# Patient Record
Sex: Female | Born: 1955 | Race: White | Hispanic: No | Marital: Married | Smoking: Never smoker
Health system: Southern US, Community
[De-identification: ages and names within clinical notes are randomized; demographics above are authoritative.]

## PROBLEM LIST (undated history)

## (undated) DIAGNOSIS — I10 Essential (primary) hypertension: Secondary | ICD-10-CM

## (undated) DIAGNOSIS — E079 Disorder of thyroid, unspecified: Secondary | ICD-10-CM

## (undated) DIAGNOSIS — R7303 Prediabetes: Secondary | ICD-10-CM

## (undated) DIAGNOSIS — N840 Polyp of corpus uteri: Secondary | ICD-10-CM

## (undated) DIAGNOSIS — M545 Low back pain, unspecified: Secondary | ICD-10-CM

## (undated) DIAGNOSIS — E559 Vitamin D deficiency, unspecified: Secondary | ICD-10-CM

## (undated) DIAGNOSIS — D259 Leiomyoma of uterus, unspecified: Secondary | ICD-10-CM

## (undated) DIAGNOSIS — Z973 Presence of spectacles and contact lenses: Secondary | ICD-10-CM

## (undated) DIAGNOSIS — D219 Benign neoplasm of connective and other soft tissue, unspecified: Secondary | ICD-10-CM

## (undated) DIAGNOSIS — R935 Abnormal findings on diagnostic imaging of other abdominal regions, including retroperitoneum: Secondary | ICD-10-CM

## (undated) DIAGNOSIS — T7840XA Allergy, unspecified, initial encounter: Secondary | ICD-10-CM

## (undated) DIAGNOSIS — M1990 Unspecified osteoarthritis, unspecified site: Secondary | ICD-10-CM

## (undated) DIAGNOSIS — N904 Leukoplakia of vulva: Secondary | ICD-10-CM

## (undated) DIAGNOSIS — G2581 Restless legs syndrome: Secondary | ICD-10-CM

## (undated) DIAGNOSIS — E039 Hypothyroidism, unspecified: Secondary | ICD-10-CM

## (undated) DIAGNOSIS — G4700 Insomnia, unspecified: Secondary | ICD-10-CM

## (undated) DIAGNOSIS — M5136 Other intervertebral disc degeneration, lumbar region: Secondary | ICD-10-CM

## (undated) DIAGNOSIS — G8929 Other chronic pain: Secondary | ICD-10-CM

## (undated) DIAGNOSIS — E785 Hyperlipidemia, unspecified: Secondary | ICD-10-CM

## (undated) DIAGNOSIS — M51369 Other intervertebral disc degeneration, lumbar region without mention of lumbar back pain or lower extremity pain: Secondary | ICD-10-CM

## (undated) DIAGNOSIS — Z972 Presence of dental prosthetic device (complete) (partial): Secondary | ICD-10-CM

## (undated) HISTORY — DX: Low back pain: M54.5

## (undated) HISTORY — DX: Other chronic pain: G89.29

## (undated) HISTORY — DX: Restless legs syndrome: G25.81

## (undated) HISTORY — DX: Benign neoplasm of connective and other soft tissue, unspecified: D21.9

## (undated) HISTORY — DX: Disorder of thyroid, unspecified: E07.9

## (undated) HISTORY — DX: Vitamin D deficiency, unspecified: E55.9

## (undated) HISTORY — DX: Allergy, unspecified, initial encounter: T78.40XA

## (undated) HISTORY — DX: Insomnia, unspecified: G47.00

## (undated) HISTORY — DX: Low back pain, unspecified: M54.50

## (undated) HISTORY — DX: Essential (primary) hypertension: I10

---

## 1986-11-07 HISTORY — PX: TENNIS ELBOW RELEASE/NIRSCHEL PROCEDURE: SHX6651

## 1997-11-07 HISTORY — PX: ELBOW SURGERY: SHX618

## 2011-11-30 DIAGNOSIS — G8929 Other chronic pain: Secondary | ICD-10-CM | POA: Insufficient documentation

## 2013-08-21 ENCOUNTER — Ambulatory Visit: Payer: Self-pay

## 2013-12-31 ENCOUNTER — Telehealth: Payer: Self-pay | Admitting: *Deleted

## 2013-12-31 NOTE — Telephone Encounter (Signed)
Refill request Mobic 15mg  denied, initially prescribed by Dr Janus Molder.  Denied, pt needs an appt.

## 2016-01-08 LAB — HM MAMMOGRAPHY

## 2016-02-05 LAB — HM PAP SMEAR: HM PAP: NEGATIVE

## 2016-04-15 ENCOUNTER — Ambulatory Visit (INDEPENDENT_AMBULATORY_CARE_PROVIDER_SITE_OTHER): Payer: BLUE CROSS/BLUE SHIELD | Admitting: Family Medicine

## 2016-04-15 ENCOUNTER — Encounter: Payer: Self-pay | Admitting: Family Medicine

## 2016-04-15 VITALS — BP 104/68 | HR 81 | Ht 63.75 in | Wt 227.1 lb

## 2016-04-15 DIAGNOSIS — E039 Hypothyroidism, unspecified: Secondary | ICD-10-CM

## 2016-04-15 DIAGNOSIS — K219 Gastro-esophageal reflux disease without esophagitis: Secondary | ICD-10-CM

## 2016-04-15 DIAGNOSIS — Z113 Encounter for screening for infections with a predominantly sexual mode of transmission: Secondary | ICD-10-CM

## 2016-04-15 DIAGNOSIS — E079 Disorder of thyroid, unspecified: Secondary | ICD-10-CM

## 2016-04-15 DIAGNOSIS — G4700 Insomnia, unspecified: Secondary | ICD-10-CM

## 2016-04-15 DIAGNOSIS — I10 Essential (primary) hypertension: Secondary | ICD-10-CM

## 2016-04-15 DIAGNOSIS — Z8639 Personal history of other endocrine, nutritional and metabolic disease: Secondary | ICD-10-CM

## 2016-04-15 DIAGNOSIS — Z1321 Encounter for screening for nutritional disorder: Secondary | ICD-10-CM | POA: Diagnosis not present

## 2016-04-15 DIAGNOSIS — E559 Vitamin D deficiency, unspecified: Secondary | ICD-10-CM

## 2016-04-15 DIAGNOSIS — E669 Obesity, unspecified: Secondary | ICD-10-CM

## 2016-04-15 DIAGNOSIS — G8929 Other chronic pain: Secondary | ICD-10-CM

## 2016-04-15 DIAGNOSIS — E785 Hyperlipidemia, unspecified: Secondary | ICD-10-CM

## 2016-04-15 NOTE — Patient Instructions (Addendum)
- Get fasting labs in near future- pt to make appt b-4 leaving today. - cut BP med in half--> check at home and keep a BP log - address wt loss and phenteramine at next visit if BP remains well controlled and wil discuss labs obtain     DASH Eating Plan  DASH stands for "Dietary Approaches to Stop Hypertension." The DASH eating plan is a healthy eating plan that has been shown to reduce high blood pressure (hypertension). Additional health benefits may include reducing the risk of type 2 diabetes mellitus, heart disease, and stroke. The DASH eating plan may also help with weight loss. WHAT DO I NEED TO KNOW ABOUT THE DASH EATING PLAN? For the DASH eating plan, you will follow these general guidelines:  Choose foods with a percent daily value for sodium of less than 5% (as listed on the food label).  Use salt-free seasonings or herbs instead of table salt or sea salt.  Check with your health care provider or pharmacist before using salt substitutes.  Eat lower-sodium products, often labeled as "lower sodium" or "no salt added."  Eat fresh foods.  Eat more vegetables, fruits, and low-fat dairy products.  Choose whole grains. Look for the word "whole" as the first word in the ingredient list.  Choose fish and skinless chicken or [place] more often than red meat. Limit fish, poultry, and meat to 6 oz (170 g) each day.  Limit sweets, desserts, sugars, and sugary drinks.  Choose heart-healthy fats.  Limit cheese to 1 oz (28 g) per day.  Eat more home-cooked food and less restaurant, buffet, and fast food.  Limit fried foods.  Cook foods using methods other than frying.  Limit canned vegetables. If you do use them, rinse them well to decrease the sodium.  When eating at a restaurant, ask that your food be prepared with less salt, or no salt if possible. WHAT FOODS CAN I EAT? Seek help from a dietitian for individual calorie needs. Grains Whole grain or whole wheat bread. Brown  rice. Whole grain or whole wheat pasta. Quinoa, bulgur, and whole grain cereals. Low-sodium cereals. Corn or whole wheat flour tortillas. Whole grain cornbread. Whole grain crackers. Low-sodium crackers. Vegetables Fresh or frozen vegetables (raw, steamed, roasted, or grilled). Low-sodium or reduced-sodium tomato and vegetable juices. Low-sodium or reduced-sodium tomato sauce and paste. Low-sodium or reduced-sodium canned vegetables.  Fruits All fresh, canned (in natural juice), or frozen fruits. Meat and Other Protein Products Ground beef (85% or leaner), grass-fed beef, or beef trimmed of fat. Skinless chicken or [place]. Ground chicken or [place]. Pork trimmed of fat. All fish and seafood. Eggs. Dried beans, peas, or lentils. Unsalted nuts and seeds. Unsalted canned beans. Dairy Low-fat dairy products, such as skim or 1% milk, 2% or reduced-fat cheeses, low-fat ricotta or cottage cheese, or plain low-fat yogurt. Low-sodium or reduced-sodium cheeses. Fats and Oils Tub margarines without trans fats. Light or reduced-fat mayonnaise and salad dressings (reduced sodium). Avocado. Safflower, olive, or canola oils. Natural peanut or almond butter. Other Unsalted popcorn and pretzels. The items listed above may not be a complete list of recommended foods or beverages. Contact your dietitian for more options. WHAT FOODS ARE NOT RECOMMENDED? Grains White bread. White pasta. White rice. Refined cornbread. Bagels and croissants. Crackers that contain trans fat. Vegetables Creamed or fried vegetables. Vegetables in a cheese sauce. Regular canned vegetables. Regular canned tomato sauce and paste. Regular tomato and vegetable juices. Fruits Dried fruits. Canned fruit in light or  heavy syrup. Fruit juice. Meat and Other Protein Products Fatty cuts of meat. Ribs, chicken wings, bacon, sausage, bologna, salami, chitterlings, fatback, hot dogs, bratwurst, and packaged luncheon meats. Salted nuts and seeds.  Canned beans with salt. Dairy Whole or 2% milk, cream, half-and-half, and cream cheese. Whole-fat or sweetened yogurt. Full-fat cheeses or blue cheese. Nondairy creamers and whipped toppings. Processed cheese, cheese spreads, or cheese curds. Condiments Onion and garlic salt, seasoned salt, table salt, and sea salt. Canned and packaged gravies. Worcestershire sauce. Tartar sauce. Barbecue sauce. Teriyaki sauce. Soy sauce, including reduced sodium. Steak sauce. Fish sauce. Oyster sauce. Cocktail sauce. Horseradish. Ketchup and mustard. Meat flavorings and tenderizers. Bouillon cubes. Hot sauce. Tabasco sauce. Marinades. Taco seasonings. Relishes. Fats and Oils Butter, stick margarine, lard, shortening, ghee, and bacon fat. Coconut, palm kernel, or palm oils. Regular salad dressings. Other Pickles and olives. Salted popcorn and pretzels. The items listed above may not be a complete list of foods and beverages to avoid. Contact your dietitian for more information. WHERE CAN I FIND MORE INFORMATION? National Heart, Lung, and Blood Institute: travelstabloid.com   This information is not intended to replace advice given to you by your health care provider. Make sure you discuss any questions you have with your health care provider.   Document Released: 10/13/2011 Document Revised: 11/14/2014 Document Reviewed: 08/28/2013 Elsevier Interactive Patient Education 2016 [employer]  1. Water Drink at least 8 to 12 cups of water daily. Consume half of your body weight in pounds, is the amount of water in ounces to drink daily.  Ie: a 200lb person = 100 oz water daily  2. Dark Green Vegetables Eat dark green vegetables at least three to four times a week. Good options include broccoli, peppers, brussel sprouts and leafy greens like kale and spinach.  3. Whole Grains Whole grains should be included in your diet at least two to three times  daily. Look for whole wheat flour, rye, oatmeal, barley, amaranth, quinoa or a multigrain. A good source of fiber includes 3 to 4 grams of fiber per serving. A great source has 5 or more grams of fiber per serving.  4. Beans and Lentils Try to eat a bean-based meal at least once a week. Try to add legumes, including beans and lentils, to soups, stews, casseroles, salads and dips or eat them plain.  5. Fish Try to eat two to three serving of fish a week. A serving consists of 3 to 4 ounces of cooked fish. Good choices are salmon, trout, herring, bluefish, sardines and tuna.  6. Berries Include two to four servings of fruit in your diet each day. Try to eat berries such as raspberries, blueberries, blackberries and strawberries.  7. Winter Squash Eat butternut and acorn squash as well as other richly pigmented dark orange and green colored vegetables like sweet potato, cantaloupe and mango.  8. Soy 25 grams of soy protein a day is recommended as part of a low-fat diet to help lower cholesterol levels. Try tofu, soymilk, edamame soybeans, tempeh and texturized vegetable protein (TVP).  9. Flaxseed, Nuts and Seeds Add 1 to 2 tablespoons of ground flaxseed or other seeds to food each day or include a moderate amount of nuts - 1/4 cup - in your daily diet.  10. Organic Yogurt Men and women between 40 and 39 years of age need 1000 milligrams of calcium a day and 1200 milligrams if 61 or older. Eat calcium-rich foods  such as nonfat or low-fat dairy products three to four times a day. Include organic choices.  Phentermine tablets or capsules What is this medicine? PHENTERMINE (FEN ter meen) decreases your appetite. It is used with a reduced calorie diet and exercise to help you lose weight. This medicine may be used for other purposes; ask your health care provider or pharmacist if you have questions. What should I tell my health care provider before I take this medicine? They need to know if you  have any of these conditions: -agitation -glaucoma -heart disease -high blood pressure -history of substance abuse -lung disease called Primary Pulmonary Hypertension (PPH) -taken an MAOI like Carbex, Eldepryl, Marplan, Nardil, or Parnate in last 14 days -thyroid disease -an unusual or allergic reaction to phentermine, other medicines, foods, dyes, or preservatives -pregnant or trying to get pregnant -breast-feeding How should I use this medicine? Take this medicine by mouth with a glass of water. Follow the directions on the prescription label. This medicine is usually taken 30 minutes before or 1 to 2 hours after breakfast. Avoid taking this medicine in the evening. It may interfere with sleep. Take your doses at regular intervals. Do not take your medicine more often than directed. Talk to your pediatrician regarding the use of this medicine in children. Special care may be needed. Overdosage: If you think you have taken too much of this medicine contact a poison control center or emergency room at once. NOTE: This medicine is only for you. Do not share this medicine with others. What if I miss a dose? If you miss a dose, take it as soon as you can. If it is almost time for your next dose, take only that dose. Do not take double or extra doses. What may interact with this medicine? Do not take this medicine with any of the following medications: -duloxetine -MAOIs like Carbex, Eldepryl, Marplan, Nardil, and Parnate -medicines for colds or breathing difficulties like pseudoephedrine or phenylephrine -procarbazine -sibutramine -SSRIs like citalopram, escitalopram, fluoxetine, fluvoxamine, paroxetine, and sertraline -stimulants like dexmethylphenidate, methylphenidate or modafinil -venlafaxine This medicine may also interact with the following medications: -medicines for diabetes This list may not describe all possible interactions. Give your health care provider a list of all the  medicines, herbs, non-prescription drugs, or dietary supplements you use. Also tell them if you smoke, drink alcohol, or use illegal drugs. Some items may interact with your medicine. What should I watch for while using this medicine? Notify your physician immediately if you become short of breath while doing your normal activities. Do not take this medicine within 6 hours of bedtime. It can keep you from getting to sleep. Avoid drinks that contain caffeine and try to stick to a regular bedtime every night. This medicine was intended to be used in addition to a healthy diet and exercise. The best results are achieved this way. This medicine is only indicated for short-term use. Eventually your weight loss may level out. At that point, the drug will only help you maintain your new weight. Do not increase or in any way change your dose without consulting your doctor. You may get drowsy or dizzy. Do not drive, use machinery, or do anything that needs mental alertness until you know how this medicine affects you. Do not stand or sit up quickly, especially if you are an older patient. This reduces the risk of dizzy or fainting spells. Alcohol may increase dizziness and drowsiness. Avoid alcoholic drinks. What side effects may I notice from  receiving this medicine? Side effects that you should report to your doctor or health care professional as soon as possible: -chest pain, palpitations -depression or severe changes in mood -increased blood pressure -irritability -nervousness or restlessness -severe dizziness -shortness of breath -problems urinating -unusual swelling of the legs -vomiting Side effects that usually do not require medical attention (report to your doctor or health care professional if they continue or are bothersome): -blurred vision or other eye problems -changes in sexual ability or desire -constipation or diarrhea -difficulty sleeping -dry mouth or unpleasant  taste -headache -nausea This list may not describe all possible side effects. Call your doctor for medical advice about side effects. You may report side effects to FDA at 1-800-FDA-1088. Where should I keep my medicine? Keep out of the reach of children. This medicine can be abused. Keep your medicine in a safe place to protect it from theft. Do not share this medicine with anyone. Selling or giving away this medicine is dangerous and against the law. This medicine may cause accidental overdose and death if taken by other adults, children, or pets. Mix any unused medicine with a substance like cat litter or coffee grounds. Then throw the medicine away in a sealed container like a sealed bag or a coffee can with a lid. Do not use the medicine after the expiration date. Store at room temperature between 20 and 25 degrees C (68 and 77 degrees F). Keep container tightly closed. NOTE: This sheet is a summary. It may not cover all possible information. If you have questions about this medicine, talk to your doctor, pharmacist, or health care provider.    2016, Elsevier/Gold Standard. (2014-07-15 16:19:53)

## 2016-04-15 NOTE — Progress Notes (Signed)
Dawn Alexander, D.O. Primary care at [facility]  Subjective:    CC: New pt, here to establish care.   HPI: Dawn Alexander is a pleasant 60 y.o. female who presents to [facility] today To become established.     In 2012 she moved here from [place] for her husband's job and has not been able to find a PCP that she likes. She has been going to [facility] with her last visit in January.  Hypertension: As had for several years now. She monitors it closely at home and keeps a log. It is been very well controlled for at least 3-4 months now. Patient wishes to decrease and/or come off the medication.  Morbid obesity: Patient was on Fen/Phen back in 1997 for about one year when she lost 45 pounds. She has struggled with her weight all her life. She wishes to go back on a weight loss medication in the near future area next  Sleep disorder: Patient takes over-the-counter medications. Symptoms stable. No side effects of medications.  Menses related migraines:  Stable  Hypothyroidism: 12-13 years now. Patient has been on the same dose for at least 5-7 years. Stable. Asymptomatic.  Generalized osteoarthritis: Patient takes meloxicam. Symptoms stable.  GERD: Zantac when necessary. Works well. Stable. No complaints.   Past Medical History  Diagnosis Date  . Vitamin D deficiency   . Allergy   . Insomnia     Past Surgical History  Procedure Laterality Date  . Elbow surgery Right 1999    Family History  Problem Relation Age of Onset  . Diabetes Mother   . COPD Mother   . Cancer Mother     lung  . Multiple sclerosis Father   . Heart disease Father   . Cancer Sister     lung  . Stroke Sister   . Cancer Brother     bladder  . Diabetes Daughter   . Stroke Sister   . Leukemia Sister   . Alcohol abuse Brother   . Healthy Brother   . Healthy Daughter     History  Drug Use No  ,  History  Alcohol  Use  . 3.0 oz/week  . 5 Shots of liquor per week  ,  History  Smoking status  . Never Smoker   Smokeless tobacco  . Never Used  ,  History  Sexual Activity  . Sexual Activity: Yes  . Birth Control/ Protection: Post-menopausal    Patient's Medications  New Prescriptions   No medications on file  Previous Medications   CETIRIZINE (ZYRTEC) 10 MG TABLET    Take 1 tablet by mouth daily.   CHOLECALCIFEROL (VITAMIN D) 1000 UNITS TABLET    Take 5,000 Units by mouth daily.   DOXYLAMINE, SLEEP, (SLEEP AID) 25 MG TABLET    Take 1 tablet by mouth at bedtime.   LEVOTHYROXINE (SYNTHROID, LEVOTHROID) 100 MCG TABLET    Take 1 tablet by mouth daily.   LISINOPRIL-HYDROCHLOROTHIAZIDE (PRINZIDE,ZESTORETIC) 10-12.5 MG TABLET    Take 1 tablet by mouth daily.   MELOXICAM (MOBIC) 15 MG TABLET    Take 1 tablet by mouth daily.   MULTIPLE VITAMINS-MINERALS (B COMPLEX PLUS VITAMIN C PO)    Take 1 tablet by mouth daily.   RANITIDINE (ZANTAC) 150 MG CAPSULE    Take 1 capsule by mouth daily.  Modified Medications   No medications on file  Discontinued  Medications   No medications on file    ALLERGIES: Review of patient's allergies indicates no known allergies.  Review of Systems: General:   Denies fever, chills, unexplained weight loss.  Optho/Auditory:   Denies visual changes, blurred vision/LOV Respiratory:   Denies SOB, DOE.  Cardiovascular:   Denies chest pain, palpitations, new onset peripheral edema  Gastrointestinal:   Denies nausea, vomiting, diarrhea.  Genitourinary:    Denies dysuria, increased frequency, flank pain.  Endocrine:     Denies hot or cold intolerance, polyuria, polydipsia. Musculoskeletal:   Denies unexplained myalgias, joint swelling, unexplained arthralgias, gait problems.  Skin:  Denies rash, suspicious lesions Neurological:     Denies dizziness, unexplained weakness, lightheadedness, numbness  Psychiatric/Behavioral:   Denies mood changes, suicidal or homicidal ideations,  hallucinations   Objective:   Blood pressure 104/68, pulse 81, height 5' 3.75" (1.619 m), weight 227 lb 1.6 oz (103.012 kg). Body mass index is 39.3 kg/(m^2). General: Well Developed, well nourished, and in no acute distress.  Neuro: Alert and oriented x3, extra-ocular muscles intact, sensation grossly intact.  HEENT: Normocephalic, atraumatic, pupils equal round reactive to light, neck supple, no gross masses, no carotid bruits, no JVD apprec Skin: no gross suspicious lesions or rashes  Cardiac: Regular rate and rhythm, no murmurs rubs or gallops.  Respiratory: Essentially clear to auscultation bilaterally. Not using accessory muscles, speaking in full sentences.  Abdominal: Soft, not grossly distended Musculoskeletal: Ambulates w/o diff, FROM * 4 ext.  Vasc: less 2 sec cap RF, warm and pink  Psych:  No HI/SI, judgement and insight good.    Impression and Recommendations:    The patient was counselled, risk factors were discussed, anticipatory guidance given.  Obesity Once we obtain lab work in the future, we will have a follow-up office visit to then discuss the possibilities of going on phentermine;  Vitamin D deficiency Continue vitamin D supplementation; will check levels.  Insomnia Discussed with patient to use YouTube sleep meditation; sleep hygiene discussed with patient  HLD (hyperlipidemia) Will need recheck in the near future. Patient has not been on cholesterol meds for some time now.  Chronic pain Patient will follow-up with her chronic pain physicians for this condition. Told her I'm fine refilling meloxicam in the future if needed.  hypothyroidism We'll check levels in near future. Continue supplementation for now  GERD (gastroesophageal reflux disease) Continue Zantac.  Hypertension Total patient to cut dose of blood pressure med in half.    Keep a blood pressure log and bring in next office visit.    Lifestyle modifications discussed with patient's in  order to aid in bringing down blood pressure  H/O type II diabetes mellitus Patient will need lab screening to rule out current status of diabetes.   Gross side effects, risk and benefits, and alternatives of medications discussed with patient.  Patient is aware that all medications have potential side effects and we are unable to predict every sideeffect or drug-drug interaction that may occur.  Expresses verbal understanding and consents to current therapy plan and treatment regiment.  Note: This document was prepared using Dragon voice recognition software and may include unintentional dictation errors.

## 2016-04-18 ENCOUNTER — Encounter: Payer: Self-pay | Admitting: Family Medicine

## 2016-04-18 DIAGNOSIS — E559 Vitamin D deficiency, unspecified: Secondary | ICD-10-CM | POA: Insufficient documentation

## 2016-04-18 DIAGNOSIS — G4700 Insomnia, unspecified: Secondary | ICD-10-CM | POA: Insufficient documentation

## 2016-04-18 DIAGNOSIS — K219 Gastro-esophageal reflux disease without esophagitis: Secondary | ICD-10-CM | POA: Insufficient documentation

## 2016-04-18 DIAGNOSIS — Z113 Encounter for screening for infections with a predominantly sexual mode of transmission: Secondary | ICD-10-CM | POA: Insufficient documentation

## 2016-04-18 NOTE — Assessment & Plan Note (Signed)
Continue Zantac 

## 2016-04-18 NOTE — Assessment & Plan Note (Signed)
We'll check levels in near future. Continue supplementation for now

## 2016-04-18 NOTE — Assessment & Plan Note (Signed)
Patient will follow-up with her chronic pain physicians for this condition. Told her I'm fine refilling meloxicam in the future if needed.

## 2016-04-18 NOTE — Assessment & Plan Note (Signed)
Patient will need lab screening to rule out current status of diabetes.

## 2016-04-18 NOTE — Assessment & Plan Note (Signed)
Discussed with patient to use YouTube sleep meditation; sleep hygiene discussed with patient

## 2016-04-18 NOTE — Assessment & Plan Note (Signed)
Will need recheck in the near future. Patient has not been on cholesterol meds for some time now.

## 2016-04-18 NOTE — Assessment & Plan Note (Addendum)
Once we obtain lab work in the future, we will have a follow-up office visit to then discuss the possibilities of going on phentermine;

## 2016-04-18 NOTE — Assessment & Plan Note (Addendum)
Total patient to cut dose of blood pressure med in half.    Keep a blood pressure log and bring in next office visit.    Lifestyle modifications discussed with patient's in order to aid in bringing down blood pressure

## 2016-04-18 NOTE — Assessment & Plan Note (Signed)
Continue vitamin D supplementation; will check levels.

## 2016-04-21 ENCOUNTER — Other Ambulatory Visit: Payer: Self-pay

## 2016-04-21 DIAGNOSIS — I10 Essential (primary) hypertension: Secondary | ICD-10-CM

## 2016-04-21 DIAGNOSIS — G4700 Insomnia, unspecified: Secondary | ICD-10-CM

## 2016-04-21 DIAGNOSIS — E785 Hyperlipidemia, unspecified: Secondary | ICD-10-CM

## 2016-04-21 DIAGNOSIS — Z8639 Personal history of other endocrine, nutritional and metabolic disease: Secondary | ICD-10-CM

## 2016-04-21 DIAGNOSIS — E079 Disorder of thyroid, unspecified: Secondary | ICD-10-CM

## 2016-04-21 DIAGNOSIS — E559 Vitamin D deficiency, unspecified: Secondary | ICD-10-CM

## 2016-04-21 DIAGNOSIS — Z1321 Encounter for screening for nutritional disorder: Secondary | ICD-10-CM

## 2016-04-22 ENCOUNTER — Other Ambulatory Visit (INDEPENDENT_AMBULATORY_CARE_PROVIDER_SITE_OTHER): Payer: BLUE CROSS/BLUE SHIELD

## 2016-04-22 DIAGNOSIS — E559 Vitamin D deficiency, unspecified: Secondary | ICD-10-CM

## 2016-04-22 DIAGNOSIS — E785 Hyperlipidemia, unspecified: Secondary | ICD-10-CM

## 2016-04-22 DIAGNOSIS — Z1321 Encounter for screening for nutritional disorder: Secondary | ICD-10-CM

## 2016-04-22 DIAGNOSIS — I10 Essential (primary) hypertension: Secondary | ICD-10-CM

## 2016-04-22 DIAGNOSIS — G4700 Insomnia, unspecified: Secondary | ICD-10-CM

## 2016-04-22 DIAGNOSIS — E079 Disorder of thyroid, unspecified: Secondary | ICD-10-CM

## 2016-04-22 DIAGNOSIS — Z8639 Personal history of other endocrine, nutritional and metabolic disease: Secondary | ICD-10-CM

## 2016-04-23 LAB — COMPREHENSIVE METABOLIC PANEL
ALT: 13 U/L (ref 6–29)
AST: 14 U/L (ref 10–35)
Albumin: 3.8 g/dL (ref 3.6–5.1)
Alkaline Phosphatase: 72 U/L (ref 33–130)
BUN: 24 mg/dL (ref 7–25)
CHLORIDE: 105 mmol/L (ref 98–110)
CO2: 26 mmol/L (ref 20–31)
CREATININE: 0.89 mg/dL (ref 0.50–0.99)
Calcium: 9.2 mg/dL (ref 8.6–10.4)
GLUCOSE: 104 mg/dL — AB (ref 65–99)
Potassium: 4.1 mmol/L (ref 3.5–5.3)
SODIUM: 140 mmol/L (ref 135–146)
Total Bilirubin: 0.6 mg/dL (ref 0.2–1.2)
Total Protein: 6.3 g/dL (ref 6.1–8.1)

## 2016-04-23 LAB — CBC WITH DIFFERENTIAL/PLATELET
BASOS ABS: 53 {cells}/uL (ref 0–200)
Basophils Relative: 1 %
EOS PCT: 7 %
Eosinophils Absolute: 371 cells/uL (ref 15–500)
HCT: 39.2 % (ref 35.0–45.0)
Hemoglobin: 12.7 g/dL (ref 11.7–15.5)
LYMPHS PCT: 28 %
Lymphs Abs: 1484 cells/uL (ref 850–3900)
MCH: 27 pg (ref 27.0–33.0)
MCHC: 32.4 g/dL (ref 32.0–36.0)
MCV: 83.4 fL (ref 80.0–100.0)
MONOS PCT: 8 %
MPV: 11.2 fL (ref 7.5–12.5)
Monocytes Absolute: 424 cells/uL (ref 200–950)
NEUTROS ABS: 2968 {cells}/uL (ref 1500–7800)
Neutrophils Relative %: 56 %
PLATELETS: 258 10*3/uL (ref 140–400)
RBC: 4.7 MIL/uL (ref 3.80–5.10)
RDW: 13.8 % (ref 11.0–15.0)
WBC: 5.3 10*3/uL (ref 3.8–10.8)

## 2016-04-23 LAB — LIPID PANEL
CHOL/HDL RATIO: 2.9 ratio (ref <=5.0)
Cholesterol: 168 mg/dL (ref 125–200)
HDL: 57 mg/dL (ref >=46)
LDL CALC: 94 mg/dL (ref <130)
Triglycerides: 87 mg/dL (ref <150)
VLDL: 17 mg/dL (ref <30)

## 2016-04-23 LAB — VITAMIN D 25 HYDROXY (VIT D DEFICIENCY, FRACTURES): VIT D 25 HYDROXY: 47 ng/mL (ref 30–100)

## 2016-04-23 LAB — HEMOGLOBIN A1C
HEMOGLOBIN A1C: 6.2 % — AB (ref <5.7)
Mean Plasma Glucose: 131 mg/dL

## 2016-04-23 LAB — VITAMIN B12: VITAMIN B 12: 1331 pg/mL — AB (ref 200–1100)

## 2016-04-23 LAB — TSH: TSH: 1.67 m[IU]/L

## 2016-04-26 ENCOUNTER — Telehealth: Payer: Self-pay

## 2016-04-26 ENCOUNTER — Other Ambulatory Visit: Payer: BLUE CROSS/BLUE SHIELD

## 2016-04-26 NOTE — Telephone Encounter (Signed)
Pt states that her systolic blood pressure readings have been running between [id number] and dyastolically [id number].  She is concerned that these are reading is too low.  She would like to know if she can just stop her blood pressure medication altogether.  Please advise.  Charyl Bigger, CMA

## 2016-04-28 NOTE — Telephone Encounter (Signed)
Pt states that she is already taking 1/2 tablet daily.  Per Dr. Raliegh Scarlet, pt is to stop medication all together, continue checking blood pressure readings daily and RTC in 2 weeks for re-evaluation.  Pt expressed understanding and is agreeable.  Charyl Bigger, CMA

## 2016-05-03 ENCOUNTER — Ambulatory Visit: Payer: BLUE CROSS/BLUE SHIELD | Admitting: Family Medicine

## 2016-05-05 ENCOUNTER — Ambulatory Visit: Payer: BLUE CROSS/BLUE SHIELD | Admitting: Family Medicine

## 2016-05-12 ENCOUNTER — Ambulatory Visit (INDEPENDENT_AMBULATORY_CARE_PROVIDER_SITE_OTHER): Payer: BLUE CROSS/BLUE SHIELD | Admitting: Family Medicine

## 2016-05-12 ENCOUNTER — Encounter: Payer: Self-pay | Admitting: Family Medicine

## 2016-05-12 VITALS — BP 138/85 | HR 76 | Ht 63.75 in | Wt 229.9 lb

## 2016-05-12 DIAGNOSIS — Z8639 Personal history of other endocrine, nutritional and metabolic disease: Secondary | ICD-10-CM

## 2016-05-12 DIAGNOSIS — E669 Obesity, unspecified: Secondary | ICD-10-CM

## 2016-05-12 DIAGNOSIS — E079 Disorder of thyroid, unspecified: Secondary | ICD-10-CM

## 2016-05-12 DIAGNOSIS — R748 Abnormal levels of other serum enzymes: Secondary | ICD-10-CM

## 2016-05-12 DIAGNOSIS — I10 Essential (primary) hypertension: Secondary | ICD-10-CM | POA: Diagnosis not present

## 2016-05-12 DIAGNOSIS — E785 Hyperlipidemia, unspecified: Secondary | ICD-10-CM | POA: Diagnosis not present

## 2016-05-12 DIAGNOSIS — E559 Vitamin D deficiency, unspecified: Secondary | ICD-10-CM

## 2016-05-12 DIAGNOSIS — G2581 Restless legs syndrome: Secondary | ICD-10-CM

## 2016-05-12 NOTE — Patient Instructions (Addendum)
B12 levels are high at 1331 advised patient to stop her B12 supplement and only take the B complex supplement. We will recheck this in 6-12 months.    Goal: exercise / walk 10 min per day         Mediterranean Diet  Why follow it? Research shows. . Those who follow the Mediterranean diet have a reduced risk of heart disease  . The diet is associated with a reduced incidence of Parkinson's and Alzheimer's diseases . People following the diet may have longer life expectancies and lower rates of chronic diseases  . The Dietary Guidelines for Americans recommends the Mediterranean diet as an eating plan to promote health and prevent disease  What Is the Mediterranean Diet?  . Healthy eating plan based on typical foods and recipes of Mediterranean-style cooking . The diet is primarily a plant based diet; these foods should make up a majority of meals   Starches - Plant based foods should make up a majority of meals - They are an important sources of vitamins, minerals, energy, antioxidants, and fiber - Choose whole grains, foods high in fiber and minimally processed items  - Typical grain sources include wheat, oats, barley, corn, brown rice, bulgar, farro, millet, polenta, couscous  - Various types of beans include chickpeas, lentils, fava beans, black beans, white beans   Fruits  Veggies - Large quantities of antioxidant rich fruits & veggies; 6 or more servings  - Vegetables can be eaten raw or lightly drizzled with oil and cooked  - Vegetables common to the traditional Mediterranean Diet include: artichokes, arugula, beets, broccoli, brussel sprouts, cabbage, carrots, celery, collard greens, cucumbers, eggplant, kale, leeks, lemons, lettuce, mushrooms, okra, onions, peas, peppers, potatoes, pumpkin, radishes, rutabaga, shallots, spinach, sweet potatoes, turnips, zucchini - Fruits common to the Mediterranean Diet include: apples, apricots, avocados, cherries, clementines, dates, figs,  grapefruits, grapes, melons, nectarines, oranges, peaches, pears, pomegranates, strawberries, tangerines  Fats - Replace butter and margarine with healthy oils, such as olive oil, canola oil, and tahini  - Limit nuts to no more than a handful a day  - Nuts include walnuts, almonds, pecans, pistachios, pine nuts  - Limit or avoid candied, honey roasted or heavily salted nuts - Olives are central to the [employer] - can be eaten whole or used in a variety of dishes   Meats Protein - Limiting red meat: no more than a few times a month - When eating red meat: choose lean cuts and keep the portion to the size of deck of cards - Eggs: approx. 0 to 4 times a week  - Fish and lean poultry: at least 2 a week  - Healthy protein sources include, chicken, [place], lean beef, lamb - Increase intake of seafood such as tuna, salmon, trout, mackerel, shrimp, scallops - Avoid or limit high fat processed meats such as sausage and bacon  Dairy - Include moderate amounts of low fat dairy products  - Focus on healthy dairy such as fat free yogurt, skim milk, low or reduced fat cheese - Limit dairy products higher in fat such as whole or 2% milk, cheese, ice cream  Alcohol - Moderate amounts of red wine is ok  - No more than 5 oz daily for women (all ages) and men older than age 64  - No more than 10 oz of wine daily for men younger than 63  Other - Limit sweets and other desserts  - Use herbs and spices instead of salt to flavor  foods  - Herbs and spices common to the traditional Mediterranean Diet include: basil, bay leaves, chives, cloves, cumin, fennel, garlic, lavender, marjoram, mint, oregano, parsley, pepper, rosemary, sage, savory, sumac, tarragon, thyme   It's not just a diet, it's a lifestyle:  . The Mediterranean diet includes lifestyle factors typical of those in the region  . Foods, drinks and meals are best eaten with others and savored . Daily physical activity is important for overall good  health . This could be strenuous exercise like running and aerobics . This could also be more leisurely activities such as walking, housework, yard-work, or taking the stairs . Moderation is the key; a balanced and healthy diet accommodates most foods and drinks . Consider portion sizes and frequency of consumption of certain foods   Meal Ideas & Options:  . Breakfast:  o Whole wheat toast or whole wheat English muffins with peanut butter & hard boiled egg o Steel cut oats topped with apples & cinnamon and skim milk  o Fresh fruit: banana, strawberries, melon, berries, peaches  o Smoothies: strawberries, bananas, greek yogurt, peanut butter o Low fat greek yogurt with blueberries and granola  o Egg white omelet with spinach and mushrooms o Breakfast couscous: whole wheat couscous, apricots, skim milk, cranberries  . Sandwiches:  o Hummus and grilled vegetables (peppers, zucchini, squash) on whole wheat bread   o Grilled chicken on whole wheat pita with lettuce, tomatoes, cucumbers or tzatziki  o Tuna salad on whole wheat bread: tuna salad made with greek yogurt, olives, red peppers, capers, green onions o Garlic rosemary lamb pita: lamb sauted with garlic, rosemary, salt & pepper; add lettuce, cucumber, greek yogurt to pita - flavor with lemon juice and black pepper  . Seafood:  o Mediterranean grilled salmon, seasoned with garlic, basil, parsley, lemon juice and black pepper o Shrimp, lemon, and spinach whole-grain pasta salad made with low fat greek yogurt  o Seared scallops with lemon orzo  o Seared tuna steaks seasoned salt, pepper, coriander topped with tomato mixture of olives, tomatoes, olive oil, minced garlic, parsley, green onions and cappers  . Meats:  o Herbed greek chicken salad with kalamata olives, cucumber, feta  o Red bell peppers stuffed with spinach, bulgur, lean ground beef (or lentils) & topped with feta   o Kebabs: skewers of chicken, tomatoes, onions, zucchini,  squash  o [place] burgers: made with red onions, mint, dill, lemon juice, feta cheese topped with roasted red peppers . Vegetarian o Cucumber salad: cucumbers, artichoke hearts, celery, red onion, feta cheese, tossed in olive oil & lemon juice  o Hummus and whole grain pita points with a greek salad (lettuce, tomato, feta, olives, cucumbers, red onion) o Lentil soup with celery, carrots made with vegetable broth, garlic, salt and pepper  o Tabouli salad: parsley, bulgur, mint, scallions, cucumbers, tomato, radishes, lemon juice, olive oil, salt and pepper.     Risk factors for prediabetes and type 2 diabetes Researchers don't fully understand why some people develop prediabetes and type 2 diabetes and others don't.  It's clear that certain factors increase the risk, however, including:  Weight. The more fatty tissue you have, the more resistant your cells become to insulin.  Inactivity. The less active you are, the greater your risk. Physical activity helps you control your weight, uses up glucose as energy and makes your cells more sensitive to insulin.  Family history. Your risk increases if a parent or sibling has type 2 diabetes.  Race. Although it's  unclear why, people of certain races - including blacks, Hispanics, American Indians and Asian-Americans - are at higher risk.  Age. Your risk increases as you get older. This may be because you tend to exercise less, lose muscle mass and gain weight as you age. But type 2 diabetes is also increasing dramatically among children, adolescents and younger adults.  Gestational diabetes. If you developed gestational diabetes when you were pregnant, your risk of developing prediabetes and type 2 diabetes later increases. If you gave birth to a baby weighing more than 9 pounds (4 kilograms), you're also at risk of type 2 diabetes.  Polycystic ovary syndrome. For women, having polycystic ovary syndrome - a common condition characterized by irregular  menstrual periods, excess hair growth and obesity - increases the risk of diabetes.  High blood pressure. Having blood pressure over 140/90 millimeters of mercury (mm Hg) is linked to an increased risk of type 2 diabetes.  Abnormal cholesterol and triglyceride levels. If you have low levels of high-density lipoprotein (HDL), or "good," cholesterol, your risk of type 2 diabetes is higher. Triglycerides are another type of fat carried in the blood. People with high levels of triglycerides have an increased risk of type 2 diabetes. Your doctor can let you know what your cholesterol and triglyceride levels are.    A good guide to good carbs: The glycemic index ---If you have diabetes, or at risk for diabetes, you know all too well that when you eat carbohydrates, your blood sugar goes up. The total amount of carbs you consume at a meal or in a snack mostly determines what your blood sugar will do. But the food itself also plays a role. A serving of white rice has almost the same effect as eating pure table sugar - a quick, high spike in blood sugar. A serving of lentils has a slower, smaller effect.  ---Picking good sources of carbs can help you control your blood sugar and your weight. Even if you don't have diabetes, eating healthier carbohydrate-rich foods can help ward off a host of chronic conditions, from heart disease to various cancers to, well, diabetes.  ---One way to choose foods is with the glycemic index (GI). This tool measures how much a food boosts blood sugar.  The glycemic index rates the effect of a specific amount of a food on blood sugar compared with the same amount of pure glucose. A food with a glycemic index of 28 boosts blood sugar only 28% as much as pure glucose. One with a GI of 95 acts like pure glucose.  High glycemic foods result in a quick spike in insulin and blood sugar (also known as blood glucose). Low glycemic foods have a slower, smaller effect.   Using the glycemic  index Using the glycemic index is easy: choose foods in the low GI category instead of those in the high GI category (see below), and go easy on those in between. Low glycemic index (GI of 55 or less): Most fruits and vegetables, beans, minimally processed grains, pasta, low-fat dairy foods, and nuts.  Moderate glycemic index (GI 56 to 69): White and sweet potatoes, corn, white rice, couscous, breakfast cereals such as Cream of Wheat and Mini Wheats.  High glycemic index (GI of 70 or higher): White bread, rice cakes, most crackers, bagels, cakes, doughnuts, croissants, most packaged breakfast cereals. You can see the values for 100 commons foods and get links to more at www.[email or web address].si.  Swaps for lowering glycemic index  Instead of  this high-glycemic index food Eat this lower-glycemic index food  White rice Brown rice or converted rice  Instant oatmeal Steel-cut oats  Cornflakes Bran flakes  Baked potato Pasta, bulgur  White bread Whole-grain bread  Corn Peas or leafy greens

## 2016-05-16 DIAGNOSIS — T7840XA Allergy, unspecified, initial encounter: Secondary | ICD-10-CM | POA: Insufficient documentation

## 2016-05-16 NOTE — Progress Notes (Signed)
Subjective:    Chief Complaint  Patient presents with  . Discuss lab test results  . Ankle Pain    right    HPI: Dawn Alexander is a 60 y.o. female who presents to [place] at [facility] today for f/up to discuss labs and reck her BP- bring log of home BP's in with her.  This is pt's second time here at the clinic with [place].    In 2012 she moved here from [place] for her husband's job   Hypertension: As had for several years. She monitors it closely at home and keeps a log. It is been very well controlled for at least 3-4 months now.  Last OV was up, so asked her to f/up for it today.,   Patient wished to decrease and/or come off her BP medication, so she has kept close eye on her Bp and weened off her bp meds. . Home blood pressures been running on average 123-144 / 65-85 since she went off meds on 6/23.  Asx  Morbid obesity: Patient was on Fen/Phen back in 1997 for about one year when she lost 45 pounds. She has struggled with her weight all her life. She wishes to go back on a weight loss medication in the near future but we had discussion about it today and pt is not emotionally ready to eat healthy and start wlaking  Sleep disorder: Patient takes over-the-counter medications. Symptoms stable. No side effects of medications.  I advised melatonin OTC  Menses related migraines: Stable  Hypothyroidism: 12-13 years now. Patient has been on the same dose for at least 5-7 years. Stable. Asymptomatic.  Generalized osteoarthritis: Patient takes meloxicam. Symptoms stable but today she c/o mild R ankle pain and b/l ankle swelling.  More so in lateral aspect in ATFL region. Worse at end of night/day and goes down every am when she awakens.  Rarely ever has pain in ankle in the AM's.  Pt has really dec her activity levels more recently and states she can't start a walking program.         Past Medical History  Diagnosis Date  . Vitamin D deficiency   . Allergy     . Insomnia      Past Surgical History  Procedure Laterality Date  . Elbow surgery Right 1999     Family History  Problem Relation Age of Onset  . Diabetes Mother   . COPD Mother   . Cancer Mother     lung  . Multiple sclerosis Father   . Heart disease Father   . Cancer Sister     lung  . Stroke Sister   . Cancer Brother     bladder  . Diabetes Daughter   . Stroke Sister   . Leukemia Sister   . Alcohol abuse Brother   . Healthy Brother   . Healthy Daughter      History  Drug Use No  ,  History  Alcohol Use  . 3.0 oz/week  . 5 Shots of liquor per week  ,  History  Smoking status  . Never Smoker   Smokeless tobacco  . Never Used  ,  History  Sexual Activity  . Sexual Activity: Yes  . Birth Control/ Protection: Post-menopausal      Current Outpatient Prescriptions on File Prior to Visit  Medication Sig Dispense Refill  . cetirizine (ZYRTEC) 10 MG tablet Take 1 tablet by mouth daily.    Marland Kitchen  cholecalciferol (VITAMIN D) 1000 units tablet Take 5,000 Units by mouth daily.    Marland Kitchen doxylamine, Sleep, (SLEEP AID) 25 MG tablet Take 1 tablet by mouth at bedtime.    Marland Kitchen levothyroxine (SYNTHROID, LEVOTHROID) 100 MCG tablet Take 1 tablet by mouth daily.    . meloxicam (MOBIC) 15 MG tablet Take 1 tablet by mouth daily.    . Multiple Vitamins-Minerals (B COMPLEX PLUS VITAMIN C PO) Take 1 tablet by mouth daily.    . ranitidine (ZANTAC) 150 MG capsule Take 1 capsule by mouth daily.     No current facility-administered medications on file prior to visit.    No Known Allergies    Review of Systems:  ( Completed via adult medical history intake form today ) General:  Denies fever, chills, appetite changes, unexplained weight loss.  Respiratory: Denies SOB, DOE, cough, wheezing.  Cardiovascular: Denies chest pain, palpitations.  Gastrointestinal: Denies nausea, vomiting, diarrhea, abdominal pain.  Genitourinary: Denies dysuria, increased frequency, flank  pain. Endocrine: Denies hot or cold intolerance, polyuria, polydipsia. Musculoskeletal: Denies myalgias, back pain, joint swelling, arthralgias, gait problems.  Skin: Denies pallor, rash, suspicious lesions.  Neurological: Denies dizziness, seizures, syncope, unexplained weakness, lightheadedness, numbness and headaches.  Psychiatric/Behavioral: Denies mood changes, suicidal or homicidal ideations, hallucinations, sleep disturbances.    Objective:    Blood pressure 138/85, pulse 76, height 5' 3.75" (1.619 m), weight 229 lb 14.4 oz (104.282 kg). Body mass index is 39.78 kg/(m^2). General: Well Developed, well nourished, and in no acute distress.  HEENT: Normocephalic, atraumatic, pupils equal round reactive to light, neck supple, No carotid bruits no JVD Skin: Warm and dry, cap RF less 2 sec Cardiac: Regular rate and rhythm, distant S1, S2 EWNL's, no murmurs rubs or gallops Respiratory: ECTA B/L but very distant due to body habitus, Not using accessory muscles, speaking in full sentences. NeuroM-Sk: Ambulates w/o assistance, moves ext * 4 w/o difficulty, sensation grossly intact.  Joint:  B/l pitting edema up to below knees b/l.  Weakness with inversion/ eversion ankle strength and mild pain upon forced inversion lat aspect ankle. O/w FROM w/o crepitus.  No boney ttp, no ATFL ligament ttp. NVI distally, cap RF less 2 sec Psych: A and O *3, judgement and insight good.       Recent Results (from the past 2160 hour(s))  Vitamin D (25 hydroxy)     Status: None   Collection Time: 04/22/16  9:05 AM  Result Value Ref Range   Vit D, 25-Hydroxy 47 30 - 100 ng/mL    Comment: Vitamin D Status           25-OH Vitamin D        Deficiency                <20 ng/mL        Insufficiency         20 - 29 ng/mL        Optimal             > or = 30 ng/mL   For 25-OH Vitamin D testing on patients on D2-supplementation and patients for whom quantitation of D2 and D3 fractions is required,  the [facility] 25-OH VIT D, (D2,D3), LC/MS/MS is recommended: order code [id number] (patients > 2 yrs).   B12     Status: Abnormal   Collection Time: 04/22/16  9:05 AM  Result Value Ref Range   Vitamin B-12 1331 (H) 200 - 1100 pg/mL  CBC w/Diff  Status: None   Collection Time: 04/22/16  9:05 AM  Result Value Ref Range   WBC 5.3 3.8 - 10.8 K/uL   RBC 4.70 3.80 - 5.10 MIL/uL   Hemoglobin 12.7 11.7 - 15.5 g/dL   HCT 39.2 35.0 - 45.0 %   MCV 83.4 80.0 - 100.0 fL   MCH 27.0 27.0 - 33.0 pg   MCHC 32.4 32.0 - 36.0 g/dL   RDW 13.8 11.0 - 15.0 %   Platelets 258 140 - 400 K/uL   MPV 11.2 7.5 - 12.5 fL   Neutro Abs 2968 1500 - 7800 cells/uL   Lymphs Abs 1484 850 - 3900 cells/uL   Monocytes Absolute 424 200 - 950 cells/uL   Eosinophils Absolute 371 15 - 500 cells/uL   Basophils Absolute 53 0 - 200 cells/uL   Neutrophils Relative % 56 %   Lymphocytes Relative 28 %   Monocytes Relative 8 %   Eosinophils Relative 7 %   Basophils Relative 1 %   Smear Review Criteria for review not met     Comment: ** Please note change in unit of measure and reference range(s). **  Comp Met (CMET)     Status: Abnormal   Collection Time: 04/22/16  9:05 AM  Result Value Ref Range   Sodium 140 135 - 146 mmol/L   Potassium 4.1 3.5 - 5.3 mmol/L   Chloride 105 98 - 110 mmol/L   CO2 26 20 - 31 mmol/L   Glucose, Bld 104 (H) 65 - 99 mg/dL   BUN 24 7 - 25 mg/dL   Creat 0.89 0.50 - 0.99 mg/dL    Comment:   For patients > or = 62 years of age: The upper reference limit for Creatinine is approximately 13% higher for people identified as African-American.      Total Bilirubin 0.6 0.2 - 1.2 mg/dL   Alkaline Phosphatase 72 33 - 130 U/L   AST 14 10 - 35 U/L   ALT 13 6 - 29 U/L   Total Protein 6.3 6.1 - 8.1 g/dL   Albumin 3.8 3.6 - 5.1 g/dL   Calcium 9.2 8.6 - 10.4 mg/dL  Lipid panel     Status: None   Collection Time: 04/22/16  9:05 AM  Result Value Ref Range   Cholesterol 168 125 - 200 mg/dL    Triglycerides 87 <150 mg/dL   HDL 57 >=46 mg/dL   Total CHOL/HDL Ratio 2.9 <=5.0 Ratio   VLDL 17 <30 mg/dL   LDL Cholesterol 94 <130 mg/dL    Comment:   Total Cholesterol/HDL Ratio:CHD Risk                        Coronary Heart Disease Risk Table                                        Men       Women          1/2 Average Risk              3.4        3.3              Average Risk              5.0        4.4  2X Average Risk              9.6        7.1           3X Average Risk             23.4       11.0 Use the calculated Patient Ratio above and the CHD Risk table  to determine the patient's CHD Risk.   TSH     Status: None   Collection Time: 04/22/16  9:05 AM  Result Value Ref Range   TSH 1.67 mIU/L    Comment:   Reference Range   > or = 20 Years  0.40-4.50   Pregnancy Range First trimester  0.26-2.66 Second trimester 0.55-2.73 Third trimester  0.43-2.91     HgB A1c     Status: Abnormal   Collection Time: 04/22/16  9:05 AM  Result Value Ref Range   Hgb A1c MFr Bld 6.2 (H) <5.7 %    Comment:   For someone without known diabetes, a hemoglobin A1c value between 5.7% and 6.4% is consistent with prediabetes and should be confirmed with a follow-up test.   For someone with known diabetes, a value <7% indicates that their diabetes is well controlled. A1c targets should be individualized based on duration of diabetes, age, co-morbid conditions and other considerations.   This assay result is consistent with an increased risk of diabetes.   Currently, no consensus exists regarding use of hemoglobin A1c for diagnosis of diabetes in children.      Mean Plasma Glucose 131 mg/dL        Impression and Recommendations:    The patient was counselled, risk factors were discussed, anticipatory guidance given. Long d/c pt re: meaning of all labs and ways to improve her health/ wellness.  Problem List Items Addressed This Visit      Cardiovascular and Mediastinum    Hypertension (Chronic)     Endocrine   hypothyroidism (Chronic)     Other   HLD (hyperlipidemia) (Chronic)   Vitamin D deficiency (Chronic)   Obesity (Chronic)   H/O type II diabetes mellitus (Chronic)    6.2 on 04/22/16.      Increased vitamin B12 level due to excess supplements - Primary (Chronic)   Restless legs syndrome (RLS) (Chronic)      BP much better controlled and wnl's at home.  Cont to monitor  TSH- WNL's, cont meds.  B12 levels are high at 1331 advised patient to stop her B12 supplement and only take the B complex supplement. We will recheck this in about 6 months.   Extensive counseling re: prudent diet and needing wt loss to prevent progression to DM and need for meds.   Pt does not want that.   She still not convinced she needs to change her diet and move more---> thus we decided until pt is ready to work on her diet and move more, no Phenteramine due to risks outweighing benefits if pt not ready to get serious about diet/ lifestyle changes needed to lose wt.     Morbidly obese and pt needs to----->  Goal: exercise / walk 10 min per day.  Explained her ankle likely aches due to weakness and pt being overwt and having stress on jt.  Advised PT referral which pt declined today.   Advised her to look online for ankle strengthening exercises and if NI after working on it couple months- RTC or if any time, it  get's W-- rtc. Mobic/ or NSAIDS prn  Meds ordered this encounter  Medications  . rOPINIRole (REQUIP) 1 MG tablet    Sig: Take 1 mg by mouth at bedtime.    Please see AVS handed out to patient at the end of our visit for further patient instructions/ counseling done pertaining to today's office visit.  Gross side effects, risk and benefits, and alternatives of medications discussed with patient.  Patient is aware that all medications have potential side effects and we are unable to predict every sideeffect or drug-drug interaction that may occur.  Expresses  verbal understanding and consents to current therapy plan and treatment regiment.  Note: This document was prepared using Dragon voice recognition software and may include unintentional dictation errors.

## 2016-05-17 DIAGNOSIS — G2581 Restless legs syndrome: Secondary | ICD-10-CM | POA: Insufficient documentation

## 2016-05-17 DIAGNOSIS — R748 Abnormal levels of other serum enzymes: Secondary | ICD-10-CM | POA: Insufficient documentation

## 2016-05-17 NOTE — Assessment & Plan Note (Signed)
6.2 on 04/22/16.

## 2016-05-26 ENCOUNTER — Encounter: Payer: Self-pay | Admitting: Family Medicine

## 2016-05-27 ENCOUNTER — Encounter: Payer: Self-pay | Admitting: Family Medicine

## 2016-06-09 ENCOUNTER — Other Ambulatory Visit: Payer: Self-pay

## 2016-06-09 ENCOUNTER — Telehealth: Payer: Self-pay | Admitting: Family Medicine

## 2016-06-09 MED ORDER — MELOXICAM 15 MG PO TABS: 15.0000 mg | ORAL_TABLET | Freq: Every day | ORAL | 0 refills | Status: DC

## 2016-06-09 MED ORDER — RANITIDINE HCL 150 MG PO CAPS: 150.0000 mg | ORAL_CAPSULE | Freq: Every day | ORAL | 1 refills | Status: DC

## 2016-06-09 MED ORDER — LEVOTHYROXINE SODIUM 100 MCG PO TABS: 100.0000 ug | ORAL_TABLET | Freq: Every day | ORAL | 0 refills | Status: DC

## 2016-06-09 MED ORDER — ROPINIROLE HCL 1 MG PO TABS: 1.0000 mg | ORAL_TABLET | Freq: Every day | ORAL | 0 refills | Status: DC

## 2016-06-09 MED ORDER — CETIRIZINE HCL 10 MG PO TABS: 10.0000 mg | ORAL_TABLET | Freq: Every day | ORAL | 0 refills | Status: DC

## 2016-06-09 NOTE — Telephone Encounter (Signed)
Patient had an appt Friday but needed to r/s because of a conflict with work and will be out of her meds before she can get back in. She is requesting a refill of all meds, and them be sent to her pharmacy.

## 2016-06-10 ENCOUNTER — Ambulatory Visit: Payer: BLUE CROSS/BLUE SHIELD | Admitting: Family Medicine

## 2016-06-10 NOTE — Telephone Encounter (Signed)
Medications sent to pharmacy per Dr. Raliegh Scarlet.  Charyl Bigger, CMA

## 2016-07-01 ENCOUNTER — Ambulatory Visit (INDEPENDENT_AMBULATORY_CARE_PROVIDER_SITE_OTHER): Payer: BLUE CROSS/BLUE SHIELD | Admitting: Family Medicine

## 2016-07-01 ENCOUNTER — Encounter: Payer: Self-pay | Admitting: Family Medicine

## 2016-07-01 VITALS — BP 135/80 | HR 75 | Wt 230.6 lb

## 2016-07-01 DIAGNOSIS — M79671 Pain in right foot: Secondary | ICD-10-CM | POA: Diagnosis not present

## 2016-07-01 DIAGNOSIS — I10 Essential (primary) hypertension: Secondary | ICD-10-CM

## 2016-07-01 DIAGNOSIS — R609 Edema, unspecified: Secondary | ICD-10-CM

## 2016-07-01 DIAGNOSIS — Z8639 Personal history of other endocrine, nutritional and metabolic disease: Secondary | ICD-10-CM | POA: Diagnosis not present

## 2016-07-01 DIAGNOSIS — G8929 Other chronic pain: Secondary | ICD-10-CM | POA: Insufficient documentation

## 2016-07-01 DIAGNOSIS — G4700 Insomnia, unspecified: Secondary | ICD-10-CM

## 2016-07-01 DIAGNOSIS — I872 Venous insufficiency (chronic) (peripheral): Secondary | ICD-10-CM

## 2016-07-01 DIAGNOSIS — M79673 Pain in unspecified foot: Secondary | ICD-10-CM

## 2016-07-01 DIAGNOSIS — E669 Obesity, unspecified: Secondary | ICD-10-CM

## 2016-07-01 DIAGNOSIS — G2581 Restless legs syndrome: Secondary | ICD-10-CM

## 2016-07-01 MED ORDER — MELOXICAM 15 MG PO TABS: 15.0000 mg | ORAL_TABLET | Freq: Every day | ORAL | 1 refills | Status: DC

## 2016-07-01 MED ORDER — ROPINIROLE HCL 1 MG PO TABS: 1.0000 mg | ORAL_TABLET | Freq: Every day | ORAL | 1 refills | Status: DC

## 2016-07-01 MED ORDER — LEVOTHYROXINE SODIUM 100 MCG PO TABS: 100.0000 ug | ORAL_TABLET | Freq: Every day | ORAL | 1 refills | Status: DC

## 2016-07-01 MED ORDER — RANITIDINE HCL 150 MG PO CAPS: 150.0000 mg | ORAL_CAPSULE | Freq: Every day | ORAL | 1 refills | Status: DC

## 2016-07-01 NOTE — Assessment & Plan Note (Addendum)
Long discussion with patient about the risks and benefits of obtaining MRI of the foot versus x-ray of the knee. 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 ( She does relate that at times she feels a tightness in her right knee which is more than the left after aggravating activities.)  I advised her to ice 15-20 minutes whenever she feels that way on her knee.

## 2016-07-01 NOTE — Assessment & Plan Note (Signed)
6.2 on 6\16\17 continue prudent diet, increase activity level and weight loss

## 2016-07-01 NOTE — Patient Instructions (Signed)
Patient will speak with her daughter about joining [employer] in the very near future

## 2016-07-01 NOTE — Assessment & Plan Note (Addendum)
This has been an waxing and waning issue ever since an old injury at work when furniture fell on the top of her right foot- roughly in 2010. She is most tender at the distal aspect of the fourth metatarsal.    Advised MRI and podiatry referral which patient declined

## 2016-07-01 NOTE — Assessment & Plan Note (Signed)
Patient states she will follow-up with her daughter today and discuss them both going to [employer] in the very near future. She wishes to wait on all further imaging and further referrals\testing of the pain\swelling in her right lower extremity until she loses weight.  Long discussion on how much of her edema is likely dependent and more so on the right due to greater 08 on the right knee than left.    She also had some right greater trochanteric bursal tenderness today which I told her I would be happy to do a steroid injection in the near future for her if she would like.

## 2016-07-01 NOTE — Assessment & Plan Note (Addendum)
Was on lisinopril -hydrochlorothiazide in the recent past and I took her off medicines on 6/23\17!  She has done great.    Been well controlled- through only diet and lifestyle. Patient has no complaints.  Continue-prudent diet and monitor at home.

## 2016-07-01 NOTE — Assessment & Plan Note (Addendum)
Explained that her bilateral pitting peripheral edema is due to inactivity, poor diet, impedance to blood flow returned back to the heart.   Advised exercise, DASH diet, drink more water, weight loss, TED hose, elevation of feet whenever possible.

## 2016-07-01 NOTE — Progress Notes (Signed)
Impression and Recommendations:    1. R  L Ext edema   2. Chronic foot pain, right   3. Essential hypertension   4. type II diabetes mellitus- diet controlled   5. Restless legs syndrome (RLS)   6. Insomnia   7. Venous (peripheral) insufficiency- B\L L Ext   8. Chronic pain in right foot   9. Morbidly Obesity- 39.89 BMI    Right lower extremity edema Long discussion with patient about the risks and benefits of obtaining MRI of the foot versus x-ray of the knee, versus podiatry referral versus orthopedic referral versus other. We determined she likely has right OA of her knee which is causing the edema to be dependent and due to gravity and it is pooling in her right ankle more so than left ankle. ( She does relate that at times she feels a tightness in her right knee which is more than the left after aggravating activities.)  I advised her to ice 15-20 minutes whenever she feels that way on her knee. I reminded patient that she also has some ATFL ligamentous laxity and pain on inversion which, like last office visit, I recommend she do ankle strengthening exercises at home as she declines physical therapy  Hypertension Was on lisinopril -hydrochlorothiazide in the recent past and I took her off medicines on 6/23\17!  She has done great.    Been well controlled- through only diet and lifestyle. Patient has no complaints.  Continue-prudent diet and monitor at home.    type II diabetes mellitus- diet controlled 6.2 on 6\16\17 continue prudent diet, increase activity level and weight loss    Restless legs syndrome (RLS) Encouraged patient to continue Requip when necessary. We can consider Neurontin in the future if needed.  Explained that swelling would not make restless leg syndrome worse but, inactivity and lack of proper hydration/ lack water, feeling excessively sleepy, too much alcohol.  I advised patient to cut back on her 2 or more glasses of wine every evening and  explained that alcohol can worsen restless leg syndrome.      Insomnia YouTube sleep meditation, 5-10 mg melatonin nightly. Stop the over-the-counter sleep aid    Venous (peripheral) insufficiency- B\L L Ext Explained that her bilateral pitting peripheral edema is due to inactivity, poor diet, impedance to blood flow returned back to the heart.   Advised exercise, DASH diet, drink more water, weight loss, TED hose, elevation of feet whenever possible.    Chronic pain in right foot This has been an waxing and waning issue ever since an old injury at work when furniture fell on the top of her right foot- roughly in 2010. She is most tender at the distal aspect of the fourth metatarsal.    Advised MRI and podiatry referral which patient declined    Morbidly Obesity- 39.89 BMI Patient states she will follow-up with her daughter today and discuss them both going to [employer] in the very near future. She wishes to wait on all further imaging and further referrals\testing of the pain\swelling in her right lower extremity until she loses weight.  Long discussion on how much of her edema is likely dependent and more so on the right due to greater 08 on the right knee than left.    She also had some right greater trochanteric bursal tenderness today which I told her I would be happy to do a steroid injection in the near future for her if she would like.  Morbidly obese and pt needs to----->  Goal: exercise / walk 10 min per day.  Explained her ankle likely aches due to weakness In ATFL region\lateral ankle and pt being overwt and having stress on jt.    Pt was in the office today for 40+ minutes, with over 50% time spent in face to face counseling of various medical concerns and in coordination of care Patient's Medications  New Prescriptions   No medications on file  Previous Medications   CETIRIZINE (ZYRTEC) 10 MG TABLET    Take 1 tablet (10 mg total) by mouth daily.    CHOLECALCIFEROL (VITAMIN D) 1000 UNITS TABLET    Take 5,000 Units by mouth daily.   MULTIPLE VITAMINS-MINERALS (B COMPLEX PLUS VITAMIN C PO)    Take 1 tablet by mouth daily.  Modified Medications   Modified Medication Previous Medication   LEVOTHYROXINE (SYNTHROID, LEVOTHROID) 100 MCG TABLET levothyroxine (SYNTHROID, LEVOTHROID) 100 MCG tablet      Take 1 tablet (100 mcg total) by mouth daily.    Take 1 tablet (100 mcg total) by mouth daily.   MELOXICAM (MOBIC) 15 MG TABLET meloxicam (MOBIC) 15 MG tablet      Take 1 tablet (15 mg total) by mouth daily.    Take 1 tablet (15 mg total) by mouth daily.   RANITIDINE (ZANTAC) 150 MG CAPSULE ranitidine (ZANTAC) 150 MG capsule      Take 1 capsule (150 mg total) by mouth daily.    Take 1 capsule (150 mg total) by mouth daily.   ROPINIROLE (REQUIP) 1 MG TABLET rOPINIRole (REQUIP) 1 MG tablet      Take 1 tablet (1 mg total) by mouth at bedtime.    Take 1 tablet (1 mg total) by mouth at bedtime.  Discontinued Medications   DOXYLAMINE, SLEEP, (SLEEP AID) 25 MG TABLET    Take 1 tablet by mouth at bedtime.    Return in about 3 months (around 10/01/2016) for Follow-up of current medical issues.  The patient was counseled, risk factors were discussed, anticipatory guidance given.  Gross side effects, risk and benefits, and alternatives of medications discussed with patient.  Patient is aware that all medications have potential side effects and we are unable to predict every side effect or drug-drug interaction that may occur.  Expresses verbal understanding and consents to current therapy plan and treatment regimen.  Please see AVS handed out to patient at the end of our visit for further patient instructions/ counseling done pertaining to today's office visit.    Note: This document was prepared using Dragon voice recognition software and may include unintentional dictation  errors.   --------------------------------------------------------------------------------------------------------------------------------------------------------------------------------------------------------------------------------------------    Subjective:    CC:  Chief Complaint  Patient presents with  . Hypertension  . Other    legs are restless at night  . Ankle Pain    HPI: Dawn Alexander is a 60 y.o. female who presents to [place] at [facility] today for issues as discussed below.   Patient tells me her husband, whom I've seen last night, Randall Hiss, was very appreciative of my attention to his med list and long discussion we had. Questions/ concerns addressed with patient today   Chief complaint: Primary concern is the swelling she sees in her right lower extremity. She is worried about blood cots and other life-threatening pathology.  She also states that her restless leg syndrome is worse in the right leg than left.  R ankle swelling for many, many years-- post  contusion top of foot.   Occasionally has some pain.  Esp since last seen and I pushed onto skin/ palpating boney prominences.  6-7 yrs ago she injured foot- furniture landed on top of foot.     Xrays 3 times on foot/ ankle but no MRI.   Her right lower extremity has consistently been more swollen then L- and this concerns patient.   When resting and then goes to get up- discomfort is worst- first few steps she takes after resting..   At most: 2-3/10 at it's Worst.      Pt worried about swelling and RLS sx in b/l legs- R W.    TXmnt in past---> shots of cortisone in top of foot 2-3 times in past--5+ yrs ago.  3 in 1 yrs time.   Patient wonders what could be the cause.  ((  Of note, pt's subjective history from last OV earlier this month:  Generalized osteoarthritis: Patient takes meloxicam. Symptoms stable but today she c/o mild R ankle pain and b/l ankle swelling.  More so in lateral aspect in ATFL  region. Worse at end of night/day and goes down every am when she awakens.  Rarely ever has pain in ankle in the AM's.  Pt has really dec her activity levels more recently and states she can't start a walking program. ))    B/L knees hurt at times- R >er L :   can't scrub floors / kneel on knees due to pain.  Then this way for many years.  They do not  "swell " but she feels that they get  "tight " after she's put some stress and strain on themThe right is  always worse with the  "tightness feeling " inthen, especially the day after her activity that flares it.    Sleep disorder: Patient takes over-the-counter medications. Symptoms stable. No side effects of medications, but would like to wean off that medicine.  I advised melatonin OTC- which her daughter also wants her to take but she has not gotten them or tried    Morbid obesity:   She has struggled with her weight all her life.  Patient was on Fen/Phen back in 1997 for about one year when she lost 45 pounds.  She wishes to go back on a weight loss medication in the near future but we had discussion about it today and pt is still not emotionally ready to eat healthy and start exercising.   Her daughter and I have spoken to her repeatedly about Weight Watchers but patient has resisted up to this point.     Wt Readings from Last 3 Encounters:  07/01/16 230 lb 9.6 oz (104.6 kg)  05/12/16 229 lb 14.4 oz (104.3 kg)  04/15/16 227 lb 1.6 oz (103 kg)   BP Readings from Last 3 Encounters:  07/01/16 135/80  05/12/16 138/85  04/15/16 104/68   Pulse Readings from Last 3 Encounters:  07/01/16 75  05/12/16 76  04/15/16 81     Patient Active Problem List   Diagnosis Date Noted  . Hypertension 04/15/2016    Priority: High  . Right lower extremity edema 07/01/2016  . Chronic foot pain 07/01/2016  . Venous (peripheral) insufficiency- B\L L Ext 07/01/2016  . Chronic pain in right foot 07/01/2016  . Increased vitamin B12 level due to excess  supplements 05/17/2016  . Restless legs syndrome (RLS) 05/17/2016  . seasonal or environmental allergies 05/16/2016  . Insomnia 04/18/2016  . Vitamin D deficiency 04/18/2016  .  Morbidly Obesity- 39.89 BMI 04/18/2016  . type II diabetes mellitus- diet controlled 04/18/2016  . GERD (gastroesophageal reflux disease) 04/18/2016  . Screening for STD (sexually transmitted disease) 04/18/2016  . HLD (hyperlipidemia) 04/15/2016  . hypothyroidism 04/15/2016  . Chronic pain 11/30/2011    Past Medical history, Surgical history, Family history, Social history, Allergies and Medications have been entered into the medical record, reviewed and changed as needed.   Allergies:  No Known Allergies  Review of Systems: No fever/ chills, night sweats, no unintended weight loss, No chest pain, or increased shortness of breath. No N/V/D.  Pertinent positives and negatives noted in HPI above    Objective:   Blood pressure 135/80, pulse 75, weight 230 lb 9.6 oz (104.6 kg). Body mass index is 39.89 kg/m.  General: Well Developed, well nourished, appropriate for stated age, morbidly obese, pear shaped. Neuro: Alert and oriented x3, extra-ocular muscles intact, sensation grossly intact.  HEENT: Normocephalic, atraumatic, neck supple   Skin: Warm and dry, no gross rash. Respiratory:  Not using accessory muscles, speaking in full sentences-unlabored. Vascular: 3+ R>er L  L Ext Edema lower ext edema, cap RF less 2 sec. M-SK:   right greater trochanteric bursal tenderness;  + TTP distal 4th MT + 3 swelling- pitting edema bilaterally but worse on the right.   Some pain with inversion of the ankle to suggest ATFL weakness once again.. No rash/ stasis dermatitis.  Psych: No SI/HI, Insight and judgement good

## 2016-07-01 NOTE — Assessment & Plan Note (Addendum)
Encouraged patient to continue Requip when necessary. We can consider Neurontin in the future if needed.  Explained that swelling would not make restless leg syndrome worse but, inactivity and lack of proper hydration/ lack water, feeling excessively sleepy, too much alcohol.  I advised patient to cut back on her 2 or more glasses of wine every evening and explained that alcohol can worsen restless leg syndrome.

## 2016-07-01 NOTE — Assessment & Plan Note (Signed)
YouTube sleep meditation, 5-10 mg melatonin nightly. Stop the over-the-counter sleep aid

## 2016-08-05 ENCOUNTER — Encounter: Payer: Self-pay | Admitting: Family Medicine

## 2016-08-05 ENCOUNTER — Ambulatory Visit (INDEPENDENT_AMBULATORY_CARE_PROVIDER_SITE_OTHER): Payer: BLUE CROSS/BLUE SHIELD | Admitting: Family Medicine

## 2016-08-05 VITALS — BP 130/85 | HR 79 | Ht 63.75 in | Wt 229.0 lb

## 2016-08-05 DIAGNOSIS — R062 Wheezing: Secondary | ICD-10-CM | POA: Diagnosis not present

## 2016-08-05 DIAGNOSIS — R0602 Shortness of breath: Secondary | ICD-10-CM | POA: Diagnosis not present

## 2016-08-05 DIAGNOSIS — R058 Other specified cough: Secondary | ICD-10-CM

## 2016-08-05 DIAGNOSIS — Z8639 Personal history of other endocrine, nutritional and metabolic disease: Secondary | ICD-10-CM

## 2016-08-05 DIAGNOSIS — R05 Cough: Secondary | ICD-10-CM

## 2016-08-05 DIAGNOSIS — J209 Acute bronchitis, unspecified: Secondary | ICD-10-CM

## 2016-08-05 LAB — POCT UA - MICROALBUMIN
Albumin/Creatinine Ratio, Urine, POC: <30
Creatinine, POC: 300 mg/dL
MICROALBUMIN (UR) POC: 30 mg/L

## 2016-08-05 LAB — POCT GLYCOSYLATED HEMOGLOBIN (HGB A1C): Hemoglobin A1C: 5.8

## 2016-08-05 MED ORDER — METHYLPREDNISOLONE SODIUM SUCC 125 MG IJ SOLR: 125.0000 mg | Freq: Once | INTRAMUSCULAR | 0 refills | Status: AC

## 2016-08-05 MED ORDER — PREDNISONE 20 MG PO TABS: ORAL_TABLET | ORAL | 0 refills | Status: DC

## 2016-08-05 MED ORDER — HYDROCOD POLST-CPM POLST ER 10-8 MG/5ML PO SUER: 5.0000 mL | Freq: Two times a day (BID) | ORAL | 0 refills | Status: DC | PRN

## 2016-08-05 MED ORDER — METHYLPREDNISOLONE SODIUM SUCC 125 MG IJ SOLR
125.0000 mg | Freq: Once | INTRAMUSCULAR | Status: AC
  Administered 2016-08-05: 125 mg via INTRAMUSCULAR

## 2016-08-05 MED ORDER — AMOXICILLIN-POT CLAVULANATE 875-125 MG PO TABS: 1.0000 | ORAL_TABLET | Freq: Two times a day (BID) | ORAL | 0 refills | Status: DC

## 2016-08-05 NOTE — Progress Notes (Signed)
Impression and Recommendations:    1. Acute bronchitis, unspecified organism   2. Productive cough   3. Wheeze   4. Shortness of breath   5. type II diabetes mellitus- diet controlled      Supportive care discussed with patient.   Risks and benefits of prescription and OTC medications discussed and all questions were answered.  Medications as listed below  If the patient does not improve as we would anticipate, she is to follow-up for reevaluation.  The patient was counseled, risk factors were discussed, anticipatory guidance given.  Gross side effects, risk and benefits, and alternatives of medications and treatment plan in general discussed with patient.  Patient is aware that all medications have potential side effects and we are unable to predict every side effect or drug-drug interaction that may occur.   Patient will call with any questions prior to using medication if they have concerns.  Expresses verbal understanding and consents to current therapy and treatment regimen.  No barriers to understanding were identified.  Red flag symptoms and signs discussed in detail.  Patient expressed understanding regarding what to do in case of emergency\urgent symptoms  Patient initially came in for possible hip injection, asked to make follow-up office visit next week to address this only    New Prescriptions   AMOXICILLIN-CLAVULANATE (AUGMENTIN) 875-125 MG TABLET    Take 1 tablet by mouth 2 (two) times daily.   CHLORPHENIRAMINE-HYDROCODONE (TUSSIONEX) 10-8 MG/5ML SUER    Take 5 mLs by mouth every 12 (twelve) hours as needed for cough (cough, will cause drowsiness.).   PREDNISONE (DELTASONE) 20 MG TABLET    Take 3 pills a day for 2 days, 2 pills a day for 2 days, 1 pill a day for 2 days then one half pill a day for 2 days then off    Modified Medications   No medications on file    Discontinued Medications   No medications on file    Return if symptoms worsen or fail to  improve.  Please see AVS handed out to patient at the end of our visit for further patient instructions/ counseling done pertaining to today's office visit.    Note: This document was prepared using Dragon voice recognition software and may include unintentional dictation errors.   --------------------------------------------------------------------------------------------------------------------------------------------------------------------------------------------------------------------------------------------    Subjective:    CC:  Chief Complaint  Patient presents with  . URI    HPI: Dawn Alexander is a 60 y.o. female who presents to [place] at [facility] today for issues as discussed below.   Sinus HA, 9 days, no imporvmen ton OTC Muicinex DM * 2 bottles and takin gcare opf herself., productive cough- not W at night,  No F/C, No onoe sided face pain---> now moved into chest.   + feels SOB/ maybe [place].  Gets bronchitis every yr.   1 Z-pak does not work well for her- Does nothing.    Wt Readings from Last 3 Encounters:  08/05/16 229 lb (103.9 kg)  07/01/16 230 lb 9.6 oz (104.6 kg)  05/12/16 229 lb 14.4 oz (104.3 kg)   BP Readings from Last 3 Encounters:  08/05/16 130/85  07/01/16 135/80  05/12/16 138/85   Pulse Readings from Last 3 Encounters:  08/05/16 79  07/01/16 75  05/12/16 76   BMI Readings from Last 3 Encounters:  08/05/16 39.62 kg/m  07/01/16 39.89 kg/m  05/12/16 39.77 kg/m     Patient Active Problem List   Diagnosis  Date Noted  . Hypertension 04/15/2016    Priority: High  . Right lower extremity edema 07/01/2016  . Chronic foot pain 07/01/2016  . Venous (peripheral) insufficiency- B\L L Ext 07/01/2016  . Chronic pain in right foot 07/01/2016  . Increased vitamin B12 level due to excess supplements 05/17/2016  . Restless legs syndrome (RLS) 05/17/2016  . seasonal or environmental allergies 05/16/2016  . Insomnia 04/18/2016    . Vitamin D deficiency 04/18/2016  . Morbidly Obesity- 39.89 BMI 04/18/2016  . type II diabetes mellitus- diet controlled 04/18/2016  . GERD (gastroesophageal reflux disease) 04/18/2016  . Screening for STD (sexually transmitted disease) 04/18/2016  . HLD (hyperlipidemia) 04/15/2016  . hypothyroidism 04/15/2016  . Chronic pain 11/30/2011    Past Medical history, Surgical history, Family history, Social history, Allergies and Medications have been entered into the medical record, reviewed and changed as needed.   Allergies:  No Known Allergies  Review of Systems  Constitutional: Positive for malaise/fatigue. Negative for chills, diaphoresis, fever and weight loss.  HENT: Positive for congestion and sore throat. Negative for tinnitus.   Eyes: Negative.  Negative for blurred vision, double vision and photophobia.  Respiratory: Positive for cough, sputum production, shortness of breath and wheezing.   Cardiovascular: Negative.  Negative for chest pain and palpitations.  Gastrointestinal: Negative.  Negative for blood in stool, diarrhea, nausea and vomiting.  Genitourinary: Negative.  Negative for dysuria, frequency and urgency.  Musculoskeletal: Negative.  Negative for joint pain and myalgias.  Skin: Negative.  Negative for itching and rash.  Neurological: Positive for headaches. Negative for dizziness, focal weakness and weakness.  Endo/Heme/Allergies: Negative.  Negative for environmental allergies and polydipsia. Does not bruise/bleed easily.  Psychiatric/Behavioral: Negative.  Negative for depression and memory loss. The patient is not nervous/anxious and does not have insomnia.      Objective:   Blood pressure 130/85, pulse 79, height 5' 3.75" (1.619 m), weight 229 lb (103.9 kg). Body mass index is 39.62 kg/m. General: Well Developed, well nourished, appropriate for stated age.  Neuro: Alert and oriented x3, extra-ocular muscles intact, sensation grossly intact.  HEENT:  Normocephalic, atraumatic, neck supple, TMs-within normal limits, no TTP sinuses, Nares- red and swollen, dried blood left nostril, OP-clear, scant LAD- nontender Skin: Warm and dry, no gross rash. Cardiac: Distant but RRR, S1 S2,  no murmurs rubs or gallops.  Respiratory: ECTA B/L w some scattered expiratory wheezes- cleared with cough but otherwise normal, Not using accessory muscles, speaking in full sentences-unlabored. Vascular:  No gross lower ext edema, cap RF less 2 sec. Psych: No HI/SI, judgement and insight good, Euthymic mood. Full Affect.

## 2016-08-05 NOTE — Patient Instructions (Signed)

## 2016-08-08 ENCOUNTER — Telehealth: Payer: Self-pay

## 2016-08-08 NOTE — Telephone Encounter (Signed)
Dawn Alexander complains she is still sick with cough, headaches, wheezing and feels very winded. Denies fever, chills or sweats. She is still having to work. Please advise next step.

## 2016-08-08 NOTE — Telephone Encounter (Signed)
If patient needs to be out of work, that's okay. Please write her a work note for today (Mon) and tomorrow.   There is nothing from a treatment standpoint that I would change. Have her continue the Augmentin, prednisone, and tests next when necessary. She can also add Mucinex DM on top of that if she needs to. She needs to rest and take care of herself.

## 2016-08-08 NOTE — Telephone Encounter (Signed)
Patient advised of recommendations. Also, printed work note.

## 2016-09-23 ENCOUNTER — Ambulatory Visit (INDEPENDENT_AMBULATORY_CARE_PROVIDER_SITE_OTHER): Payer: BLUE CROSS/BLUE SHIELD | Admitting: Family Medicine

## 2016-09-23 ENCOUNTER — Encounter: Payer: Self-pay | Admitting: Family Medicine

## 2016-09-23 VITALS — BP 150/89 | HR 84 | Ht 63.75 in | Wt 234.7 lb

## 2016-09-23 DIAGNOSIS — G894 Chronic pain syndrome: Secondary | ICD-10-CM | POA: Diagnosis not present

## 2016-09-23 DIAGNOSIS — I10 Essential (primary) hypertension: Secondary | ICD-10-CM

## 2016-09-23 DIAGNOSIS — E669 Obesity, unspecified: Secondary | ICD-10-CM

## 2016-09-23 DIAGNOSIS — IMO0001 Reserved for inherently not codable concepts without codable children: Secondary | ICD-10-CM

## 2016-09-23 DIAGNOSIS — M7061 Trochanteric bursitis, right hip: Secondary | ICD-10-CM | POA: Diagnosis not present

## 2016-09-23 DIAGNOSIS — Z6841 Body Mass Index (BMI) 40.0 and over, adult: Secondary | ICD-10-CM | POA: Diagnosis not present

## 2016-09-23 MED ORDER — METHYLPREDNISOLONE ACETATE 40 MG/ML IJ SUSP
40.0000 mg | Freq: Once | INTRAMUSCULAR | Status: AC
Start: 2016-09-23 — End: 2016-09-23
  Administered 2016-09-23: 40 mg via INTRA_ARTICULAR

## 2016-09-23 NOTE — Assessment & Plan Note (Addendum)
Was on lisinopril-hydrochlorothiazide in the recent past and I took her off medicines on 6/23\17.  She had been consistently losing weight until today's office visit.    Ambulatory BP monitoring encouraged. Keep log and bring in next OV  Contact us prior with any Q's/ concerns.

## 2016-09-23 NOTE — Patient Instructions (Signed)
Please ice for 15 minutes every hour if needed for pain- whenever you experience pain, you can use the ice at any time in the future.  Please try to avoid aggravating activities and please read the handouts on greater trochanteric bursitis and call the office with any further questions or concerns.   Trochanteric Bursitis Trochanteric bursitis is a condition that causes hip pain. Trochanteric bursitis happens when fluid-filled sacs (bursae) in the hip get irritated. Normally these sacs absorb shock and help strong bands of tissue (tendons) in your hip glide smoothly over each other and over your hip bones. What are the causes? This condition results from increased friction between the hip bones and the tendons that go over them. This condition can happen if you:  Have weak hips.  Use your hip muscles too much (overuse).  Get hit in the hip. What increases the risk? This condition is more likely to develop in:  Women.  Adults who are middle-aged or older.  People with arthritis or a spinal condition.  People with weak buttocks muscles (gluteal muscles).  People who have one leg that is shorter than the other.  People who participate in certain kinds of athletic activities, such as:  Running sports, especially long-distance running.  Contact sports, like football or martial arts.  Sports in which falls may occur, like skiing. What are the signs or symptoms? The main symptom of this condition is pain and tenderness over the point of your hip. The pain may be:  Sharp and intense.  Dull and achy.  Felt on the outside of your thigh. It may increase when you:  Lie on your side.  Walk or run.  Go up on stairs.  Sit.  Stand up after sitting.  Stand for long periods of time. How is this diagnosed? This condition may be diagnosed based on:  Your symptoms.  Your medical history.  A physical exam.  Imaging tests, such as:  X-rays to check your bones.  An MRI or  ultrasound to check your tendons and muscles. During your physical exam, your health care provider will check the movement and strength of your hip. He or she may press on the point of your hip to check for pain. How is this treated? This condition may be treated by:  Resting.  Reducing your activity.  Avoiding activities that cause pain.  Using crutches, a cane, or a walker to decrease the strain on your hip.  Taking medicine to help with swelling.  Having medicine injected into the bursae to help with swelling.  Using ice, heat, and massage therapy for pain relief.  Physical therapy exercises for strength and flexibility.  Surgery (rare). Follow these instructions at home: Activity  Rest.  Avoid activities that cause pain.  Return to your normal activities as told by your health care provider. Ask your health care provider what activities are safe for you. Managing pain, stiffness, and swelling  Take over-the-counter and prescription medicines only as told by your health care provider.  If directed, apply heat to the injured area as told by your health care provider.  Place a towel between your skin and the heat source.  Leave the heat on for 20-30 minutes.  Remove the heat if your skin turns bright red. This is especially important if you are unable to feel pain, heat, or cold. You may have a greater risk of getting burned.  If directed, apply ice to the injured area:  Put ice in a plastic bag.  Place a towel between your skin and the bag.  Leave the ice on for 20 minutes, 2-3 times a day. General instructions  If the affected leg is one that you use for driving, ask your health care provider when it is safe to drive.  Use crutches, a cane, or a walker as told by your health care provider.  If one of your legs is shorter than the other, get fitted for a shoe insert.  Lose weight if you are overweight. How is this prevented?  Wear supportive footwear that  is appropriate for your sport.  If you have hip pain, start any new exercise or sport slowly.  Maintain physical fitness, including:  Strength.  Flexibility. Contact a health care provider if:  Your pain does not improve with 2-4 weeks. Get help right away if:  You develop severe pain.  You have a fever.  You develop increased redness over your hip.  You have a change in your bowel function or bladder function.  You cannot control the muscles in your feet. This information is not intended to replace advice given to you by your health care provider. Make sure you discuss any questions you have with your health care provider. Document Released: 12/01/2004 Document Revised: 06/29/2016 Document Reviewed: 10/09/2015 Elsevier Interactive Patient Education  2017 [employer].     Trochanteric Bursitis Rehab Ask your health care provider which exercises are safe for you. Do exercises exactly as told by your health care provider and adjust them as directed. It is normal to feel mild stretching, pulling, tightness, or discomfort as you do these exercises, but you should stop right away if you feel sudden pain or your pain gets worse.Do not begin these exercises until told by your health care provider. Stretching exercises These exercises warm up your muscles and joints and improve the movement and flexibility of your hip. These exercises also help to relieve pain and stiffness. Exercise A: Iliotibial band stretch 1. Lie on your side with your left / right leg in the top position. 2. Bend your left / right knee and grab your ankle. 3. Slowly bring your knee back so your thigh is behind your body. 4. Slowly lower your knee toward the floor until you feel a gentle stretch on the outside of your left / right thigh. If you do not feel a stretch and your knee will not fall farther, place the heel of your other foot on top of your outer knee and pull your thigh down farther. 5. Hold this  position for __________ seconds. 6. Slowly return to the starting position. Repeat __________ times. Complete this exercise __________ times a day. Strengthening exercises These exercises build strength and endurance in your hip and pelvis. Endurance is the ability to use your muscles for a long time, even after they get tired. Exercise B: Bridge (hip extensors) 1. Lie on your back on a firm surface with your knees bent and your feet flat on the floor. 2. Tighten your buttocks muscles and lift your buttocks off the floor until your trunk is level with your thighs. You should feel the muscles working in your buttocks and the back of your thighs. If this exercise is too easy, try doing it with your arms crossed over your chest. 3. Hold this position for __________ seconds. 4. Slowly return to the starting position. 5. Let your muscles relax completely between repetitions. Repeat __________ times. Complete this exercise __________ times a day. Exercise C: Squats (knee extensors and  quadriceps) 1. Stand in front of a table, with your feet and knees pointing straight ahead. You may rest your hands on the table for balance but not for support. 2. Slowly bend your knees and lower your hips like you are going to sit in a chair.  Keep your weight over your heels, not over your toes.  Keep your lower legs upright so they are parallel with the table legs.  Do not let your hips go lower than your knees.  Do not bend lower than told by your health care provider.  If your hip pain increases, do not bend as low. 3. Hold this position for __________ seconds. 4. Slowly push with your legs to return to standing. Do not use your hands to pull yourself to standing. Repeat __________ times. Complete this exercise __________ times a day. Exercise D: Hip hike 1. Stand sideways on a bottom step. Stand on your left / right leg with your other foot unsupported next to the step. You can hold onto the railing or  wall if needed for balance. 2. Keeping your knees straight and your torso square, lift your left / right hip up toward the ceiling. 3. Hold this position for __________ seconds. 4. Slowly let your left / right hip lower toward the floor, past the starting position. Your foot should get closer to the floor. Do not lean or bend your knees. Repeat __________ times. Complete this exercise __________ times a day. Exercise E: Single leg stand 1. Stand near a counter or door frame that you can hold onto for balance as needed. It is helpful to stand in front of a mirror for this exercise so you can watch your hip. 2. Squeeze your left / right buttock muscles then lift up your other foot. Do not let your left / right hip push out to the side. 3. Hold this position for __________ seconds. Repeat __________ times. Complete this exercise __________ times a day. This information is not intended to replace advice given to you by your health care provider. Make sure you discuss any questions you have with your health care provider. Document Released: 12/01/2004 Document Revised: 06/30/2016 Document Reviewed: 10/09/2015 Elsevier Interactive Patient Education  2017 [employer].

## 2016-09-23 NOTE — Assessment & Plan Note (Signed)
-   Handouts provided.  - Avoid aggravating activities  - Ice 15 minutes 4-5 times daily

## 2016-09-23 NOTE — Assessment & Plan Note (Signed)
Again advised her to look into [employer] with her daughter.     She understands how much her multiple lower extremity joint pains has to do with carrying around the excess weight.

## 2016-09-23 NOTE — Assessment & Plan Note (Addendum)
As cause of her elevated blood pressure today.  We discussed increasing her activity levels because she doesn't use it, she will lose it  Also importance of weight loss stressed again; advised to go to [employer]. Declined nutrition referral

## 2016-09-23 NOTE — Progress Notes (Signed)
Impression and Recommendations:    1. Greater trochanteric bursitis of right hip   2. Class 3 obesity with serious comorbidity and body mass index (BMI) of 40.0 to 44.9 in adult, unspecified obesity type (Dawn Alexander)   3. Essential hypertension   4. Chronic pain syndrome     Greater trochanteric bursitis of right hip - Handouts provided.  - Avoid aggravating activities  - Ice 15 minutes 4-5 times daily  Chronic pain As cause of her elevated blood pressure today.  We discussed increasing her activity levels because she doesn't use it, she will lose it  Also importance of weight loss stressed again; advised to go to [employer]. Declined nutrition referral  Morbidly Obesity- 39.89 BMI Again advised her to look into [employer] with her daughter.     She understands how much her multiple lower extremity joint pains has to do with carrying around the excess weight.  Hypertension Was on lisinopril-hydrochlorothiazide in the recent past and I took her off medicines on 6/23\17.  She had been consistently losing weight until today's office visit.    Ambulatory BP monitoring encouraged. Keep log and bring in next OV  Contact us prior with any Q's/ concerns.  Procedure:   -Injection - R Greater trochanteric bursa -Performed by Dr. [name]  -Consent obtained after discussion of risks benefits and alternatives of procedure; patient wished to proceed.   -Time-out conducted.  Pain noted to be a 8/10 at the start of the procedure -Noted no overlying erythema, warmth, induration, or other signs of local infection. -Skin prepped in a sterile fashion using chlorhexidine. -Topical analgesic spray: Ethyl chloride/ FreezEase. Completed without complication or difficulty, medication injected without resistance after negative aspiration. Patient tolerated well. EBL: Less than 1 mL Meds: 1 ml  2% Xylocaine, 1 mL Depo-Medrol 40 mg per mL, 1 mL 0.5% bupivacaine Injection site  dressed with sterile bandage. -Pain immediately improved by over 50%- pain noted to be a 0 out of 10 at the end of the procedure, suggesting accurate placement of the medication. - Ice 15-20 minutes 3-4 times per day for the next 48 hours and any time after repetitive activity thereafter Advised to call if fevers/chills, erythema, induration, drainage, or bleeding.    Follow-up as discussed  The patient was counseled, risk factors were discussed, anticipatory guidance given.  Gross side effects, risk and benefits, and alternatives of medications and treatment plan in general discussed with patient.  Patient is aware that all medications have potential side effects and we are unable to predict every side effect or drug-drug interaction that may occur.   Patient will call with any questions prior to using medication if they have concerns.  Expresses verbal understanding and consents to current therapy and treatment regimen.  No barriers to understanding were identified.  Red flag symptoms and signs discussed in detail.  Patient expressed understanding regarding what to do in case of emergency\urgent symptoms  Return if symptoms worsen or fail to improve, for  follow-up chronic disease as previously discussed..  Please see AVS handed out to patient at the end of our visit for further patient instructions/ counseling done pertaining to today's office visit.    Note: This document was prepared using Dragon voice recognition software and may include unintentional dictation errors.   --------------------------------------------------------------------------------------------------------------------------------------------------------------------------------------------------------------------------------------------    Subjective:    CC:  Chief Complaint  Patient presents with  . Hip Pain    HPI: Dawn Alexander is a 60 y.o. female  who presents to [facility] [address] today  for issues as discussed below.   Pain over the right hip area progressively worse for the past 6 months. There is no injury or Aggravating event that caused it. She's not sure what activities make it worse or what activities make it better.   She's tried nothing except for over-the-counter NSAIDs.  She has never had any prior treatment to this area and has not had any steroid injections in several years. She desires "a steroid injection".  Blood pressure normally WNL's but was elevated at today's office visit as patient says she was in 8/10 level of pain.  She will keep eye on it at home and let us know if it continues--> she will f/up sooner than planned.   SHe is up 5 lbs today from last OV.  Pt has not been motivated to "do the right thing right now" as she put it.  Pt having some inc pain in her lower extremities- esp ankles/ feet lately. No more swelling than usual. But could be inc pain with inc wt.    Wt Readings from Last 3 Encounters:  09/23/16 234 lb 11.2 oz (106.5 kg)  08/05/16 229 lb (103.9 kg)  07/01/16 230 lb 9.6 oz (104.6 kg)   BP Readings from Last 3 Encounters:  09/23/16 (!) 150/89  08/05/16 130/85  07/01/16 135/80   Pulse Readings from Last 3 Encounters:  09/23/16 84  08/05/16 79  07/01/16 75   BMI Readings from Last 3 Encounters:  09/23/16 40.60 kg/m  08/05/16 39.62 kg/m  07/01/16 39.89 kg/m     Patient Care Team    Relationship Specialty Notifications Start End  [name], DO PCP - General Family Medicine  04/15/16     Patient Active Problem List   Diagnosis Date Noted  . Hypertension 04/15/2016    Priority: High  . Greater trochanteric bursitis of right hip 09/23/2016  . Right lower extremity edema 07/01/2016  . Chronic foot pain 07/01/2016  . Venous (peripheral) insufficiency- B\L L Ext 07/01/2016  . Chronic pain in right foot 07/01/2016  . Increased vitamin B12 level due to excess supplements 05/17/2016  . Restless legs syndrome (RLS)  05/17/2016  . seasonal or environmental allergies 05/16/2016  . Insomnia 04/18/2016  . Vitamin D deficiency 04/18/2016  . Morbidly Obesity- 39.89 BMI 04/18/2016  . type II diabetes mellitus- diet controlled 04/18/2016  . GERD (gastroesophageal reflux disease) 04/18/2016  . Screening for STD (sexually transmitted disease) 04/18/2016  . HLD (hyperlipidemia) 04/15/2016  . hypothyroidism 04/15/2016  . Chronic pain 11/30/2011    Past Medical history, Surgical history, Family history, Social history, Allergies and Medications have been entered into the medical record, reviewed and changed as needed.   Allergies:  No Known Allergies  Review of Systems  Constitutional: Negative.  Negative for chills, diaphoresis, fever, malaise/fatigue and weight loss.  HENT: Negative.  Negative for congestion, sore throat and tinnitus.   Eyes: Negative.  Negative for blurred vision, double vision and photophobia.  Respiratory: Negative.  Negative for cough and wheezing.   Cardiovascular: Negative.  Negative for chest pain and palpitations.  Gastrointestinal: Negative.  Negative for blood in stool, diarrhea, nausea and vomiting.  Genitourinary: Negative.  Negative for dysuria, frequency and urgency.  Musculoskeletal: Positive for joint pain. Negative for myalgias.  Skin: Negative.  Negative for itching and rash.  Neurological: Negative.  Negative for dizziness, focal weakness, weakness and headaches.  Endo/Heme/Allergies: Negative.  Negative for environmental  allergies and polydipsia. Does not bruise/bleed easily.  Psychiatric/Behavioral: Negative.  Negative for depression and memory loss. The patient is not nervous/anxious and does not have insomnia.      Objective:   Blood pressure (!) 150/89, pulse 84, height 5' 3.75" (1.619 m), weight 234 lb 11.2 oz (106.5 kg). Body mass index is 40.6 kg/m. General: Overly no hearing rub her back please Developed, well nourished, appropriate for stated age.  Neuro:  Alert and oriented x3, extra-ocular muscles intact, sensation grossly intact.  HEENT: Normocephalic, atraumatic, neck supple   Skin: Warm and dry, no gross rash. Respiratory: Not using accessory muscles, speaking in full sentences-unlabored. Msk: Pt max TTP over inferior aspect greater trochanter R hip, no rash, inc warmth, ecchymosis or edema present Vascular:  No gross lower ext edema, cap RF less 2 sec. Psych: No HI/SI, judgement and insight good, Euthymic mood. Full Affect.

## 2016-11-04 ENCOUNTER — Telehealth: Payer: Self-pay | Admitting: Family Medicine

## 2016-11-04 NOTE — Telephone Encounter (Signed)
error 

## 2016-11-13 ENCOUNTER — Other Ambulatory Visit: Payer: Self-pay | Admitting: Family Medicine

## 2017-01-02 ENCOUNTER — Ambulatory Visit (INDEPENDENT_AMBULATORY_CARE_PROVIDER_SITE_OTHER): Payer: BLUE CROSS/BLUE SHIELD | Admitting: Adult Health

## 2017-01-02 ENCOUNTER — Encounter: Payer: Self-pay | Admitting: Adult Health

## 2017-01-02 DIAGNOSIS — S7001XA Contusion of right hip, initial encounter: Secondary | ICD-10-CM | POA: Diagnosis not present

## 2017-01-02 DIAGNOSIS — M25551 Pain in right hip: Secondary | ICD-10-CM | POA: Diagnosis not present

## 2017-01-02 MED ORDER — HYDROCODONE-ACETAMINOPHEN 5-325 MG PO TABS: 1.0000 | ORAL_TABLET | Freq: Every evening | ORAL | 0 refills | Status: DC | PRN

## 2017-01-02 MED ORDER — ROPINIROLE HCL 1 MG PO TABS: 1.0000 mg | ORAL_TABLET | Freq: Every day | ORAL | 1 refills | Status: DC

## 2017-01-02 NOTE — Progress Notes (Addendum)
Subjective:    Patient ID: Dawn Alexander, female    DOB: Apr 04, 1956, 61 y.o.   MRN: [id number]  HPI:  Dawn Alexander fell 12/22/2016 from her porch to the ground/paver rocks.  She struck her right hip, "backside", and right elbow.  She denies striking her head or LOC.  She presents with R hip pain that radiates to middle of right gastrocnemius muscle.  Pain rated at 1/10-10/10, worsens when sitting/lying for prolonged periods.  She reports that the pain at night "keeps her up for hours and I cannot get any sleep".  She has been using Rx Meloxicam every morning and OTC Ibuprofen.  She has not used ice and reports having to stand for long periods during her work day.  She denies bowel/bladder dysfunction.  She denies instability when walking/driving.     Patient Care Team    Relationship Specialty Notifications Start End  [name], DO PCP - General Family Medicine  04/15/16     Patient Active Problem List   Diagnosis Date Noted  . Contusion of right hip 01/02/2017  . Right hip pain 01/02/2017  . Greater trochanteric bursitis of right hip 09/23/2016  . Right lower extremity edema 07/01/2016  . Chronic foot pain 07/01/2016  . Venous (peripheral) insufficiency- B\L L Ext 07/01/2016  . Chronic pain in right foot 07/01/2016  . Increased vitamin B12 level due to excess supplements 05/17/2016  . Restless legs syndrome (RLS) 05/17/2016  . seasonal or environmental allergies 05/16/2016  . Insomnia 04/18/2016  . Vitamin D deficiency 04/18/2016  . Morbidly Obesity- 39.89 BMI 04/18/2016  . type II diabetes mellitus- diet controlled 04/18/2016  . GERD (gastroesophageal reflux disease) 04/18/2016  . Screening for STD (sexually transmitted disease) 04/18/2016  . HLD (hyperlipidemia) 04/15/2016  . Hypertension 04/15/2016  . hypothyroidism 04/15/2016  . Chronic pain 11/30/2011     Past Medical History:  Diagnosis Date  . Allergy   . Insomnia   . Vitamin D deficiency      Past Surgical  History:  Procedure Laterality Date  . ELBOW SURGERY Right 1999     Family History  Problem Relation Age of Onset  . Diabetes Mother   . COPD Mother   . Cancer Mother     lung  . Multiple sclerosis Father   . Heart disease Father   . Cancer Sister     lung  . Stroke Sister   . Cancer Brother     bladder  . Diabetes Daughter   . Stroke Sister   . Leukemia Sister   . Alcohol abuse Brother   . Healthy Brother   . Healthy Daughter      History  Drug Use No     History  Alcohol Use  . 3.0 oz/week  . 5 Shots of liquor per week     History  Smoking Status  . Never Smoker  Smokeless Tobacco  . Never Used     Outpatient Encounter Prescriptions as of 01/02/2017  Medication Sig Note  . cholecalciferol (VITAMIN D) 1000 units tablet Take 5,000 Units by mouth daily.   Marland Kitchen levothyroxine (SYNTHROID, LEVOTHROID) 100 MCG tablet Take 1 tablet (100 mcg total) by mouth daily.   . Melatonin 5 MG TABS Take 1 tablet by mouth at bedtime.   . meloxicam (MOBIC) 15 MG tablet TAKE ONE TABLET BY MOUTH ONCE DAILY   . Multiple Vitamins-Minerals (B COMPLEX PLUS VITAMIN C PO) Take 1 tablet by mouth daily. 04/15/2016: Received from: [facility]  Sig: Take by mouth.  . ranitidine (ZANTAC) 150 MG capsule Take 1 capsule (150 mg total) by mouth daily.   Marland Kitchen rOPINIRole (REQUIP) 1 MG tablet Take 1 tablet (1 mg total) by mouth at bedtime.   . [DISCONTINUED] rOPINIRole (REQUIP) 1 MG tablet Take 1 tablet (1 mg total) by mouth at bedtime.   Marland Kitchen HYDROcodone-acetaminophen (NORCO/VICODIN) 5-325 MG tablet Take 1-2 tablets by mouth at bedtime as needed for severe pain.    No facility-administered encounter medications on file as of 01/02/2017.     Allergies: Patient has no known allergies.  Body mass index is 40.31 kg/m.  Blood pressure 119/72, pulse 78, height 5' 3.75" (1.619 m), weight 233 lb (105.7 kg).     Review of Systems  Constitutional: Negative for activity change, appetite change,  chills, diaphoresis, fatigue, fever and unexpected weight change.  Respiratory: Negative for cough and shortness of breath.   Cardiovascular: Positive for leg swelling. Negative for chest pain and palpitations.  Gastrointestinal: Negative for abdominal distention, constipation, diarrhea, nausea and vomiting.  Endocrine: Negative for cold intolerance, heat intolerance, polydipsia, polyphagia and polyuria.  Genitourinary: Negative for difficulty urinating and flank pain.  Musculoskeletal: Positive for arthralgias, back pain, gait problem, joint swelling and myalgias.  Skin: Positive for wound. Negative for color change, pallor and rash.       2 bruises-one on each hip/buttuck.  Neurological: Negative for dizziness, tremors and weakness.       Objective:   Physical Exam  Constitutional: She is oriented to person, place, and time. She appears well-developed and well-nourished. No distress.  HENT:  Head: Normocephalic and atraumatic.  Cardiovascular: Normal rate, regular rhythm, normal heart sounds and intact distal pulses.   Pulmonary/Chest: Effort normal and breath sounds normal. No respiratory distress. She has no rales. She exhibits no tenderness.  Musculoskeletal: Normal range of motion. She exhibits edema and tenderness.       Right hip: She exhibits tenderness.       Right knee: She exhibits swelling. Tenderness found.       Right ankle: She exhibits swelling. She exhibits normal pulse.       Lumbar back: She exhibits tenderness.       Right upper leg: She exhibits tenderness.       Right lower leg: She exhibits tenderness and edema.       Legs: Two areas of ecchymosis as indicated on diagram. No open tissue/excessive warmth/drainage noted.   Neurological: She is alert and oriented to person, place, and time. She has normal reflexes.  Skin: Skin is warm and dry. No rash noted. She is not diaphoretic. No pallor.  Psychiatric: She has a normal mood and affect. Her behavior is normal.  Judgment and thought content normal.          Assessment & Plan:   1. Contusion of right hip, initial encounter   2. Right hip pain     Contusion of right hip May use Norco as needed at bedtime only. Do not drink alcohol and take narcotic pain medication. DEA registry reviewed prior to writing narcotic Rx. Continue morning Meloxicam as needed, then may use OTC Tylenol for breakthough pain during the day.  Do not take other NSAIDs when taking Meloxicam. Apply ice for 20 mins several times daily to right hip. If pain does not improve in 1-2 weeks, please call clinic.  Right hip pain 07/2016 cortisone injection in right hip. Continue morning Meloxicam as needed, then may use OTC Tylenol for breakthough  pain during the day.  Do not take other NSAIDs when taking Meloxicam. Take breaks when working long hours, I.e. Sit, stretch.    FOLLOW-UP:  Return if symptoms worsen or fail to improve.

## 2017-01-02 NOTE — Assessment & Plan Note (Addendum)
May use Norco as needed at bedtime only. Do not drink alcohol and take narcotic pain medication. DEA registry reviewed prior to writing narcotic Rx. Continue morning Meloxicam as needed, then may use OTC Tylenol for breakthough pain during the day.  Do not take other NSAIDs when taking Meloxicam. Apply ice for 20 mins several times daily to right hip. If pain does not improve in 1-2 weeks, please call clinic.

## 2017-01-02 NOTE — Patient Instructions (Addendum)
Hip Pain Your hip is the joint between your upper legs and your lower pelvis. The bones, cartilage, tendons, and muscles of your hip joint perform a lot of work each day supporting your body weight and allowing you to move around. Hip pain can range from a minor ache to severe pain in one or both of your hips. Pain may be felt on the inside of the hip joint near the groin, or the outside near the buttocks and upper thigh. You may have swelling or stiffness as well. Follow these instructions at home:  Take medicines only as directed by your health care provider.  Apply ice to the injured area:  Put ice in a plastic bag.  Place a towel between your skin and the bag.  Leave the ice on for 15-20 minutes at a time, 3-4 times a day.  Keep your leg raised (elevated) when possible to lessen swelling.  Avoid activities that cause pain.  Follow specific exercises as directed by your health care provider.  Sleep with a pillow between your legs on your most comfortable side.  Record how often you have hip pain, the location of the pain, and what it feels like. Contact a health care provider if:  You are unable to put weight on your leg.  Your hip is red or swollen or very tender to touch.  Your pain or swelling continues or worsens after 1 week.  You have increasing difficulty walking.  You have a fever. Get help right away if:  You have fallen.  You have a sudden increase in pain and swelling in your hip. This information is not intended to replace advice given to you by your health care provider. Make sure you discuss any questions you have with your health care provider. Document Released: 04/13/2010 Document Revised: 03/31/2016 Document Reviewed: 06/20/2013 Elsevier Interactive Patient Education  2017 [place] A contusion is a deep bruise. Contusions are the result of a blunt injury to tissues and muscle fibers under the skin. The injury causes bleeding under  the skin. The skin overlying the contusion may turn blue, purple, or yellow. Minor injuries will give you a painless contusion, but more severe contusions may stay painful and swollen for a few weeks. What are the causes? This condition is usually caused by a blow, trauma, or direct force to an area of the body. What are the signs or symptoms? Symptoms of this condition include:  Swelling of the injured area.  Pain and tenderness in the injured area.  Discoloration. The area may have redness and then turn blue, purple, or yellow. How is this diagnosed? This condition is diagnosed based on a physical exam and medical history. An X-ray, CT scan, or MRI may be needed to determine if there are any associated injuries, such as broken bones (fractures). How is this treated? Specific treatment for this condition depends on what area of the body was injured. In general, the best treatment for a contusion is resting, icing, applying pressure to (compression), and elevating the injured area. This is often called the RICE strategy. Over-the-counter anti-inflammatory medicines may also be recommended for pain control. Follow these instructions at home:  Rest the injured area.  If directed, apply ice to the injured area:  Put ice in a plastic bag.  Place a towel between your skin and the bag.  Leave the ice on for 20 minutes, 2-3 times per day.  If directed, apply light compression to the injured area  using an elastic bandage. Make sure the bandage is not wrapped too tightly. Remove and reapply the bandage as directed by your health care provider.  If possible, raise (elevate) the injured area above the level of your heart while you are sitting or lying down.  Take over-the-counter and prescription medicines only as told by your health care provider. Contact a health care provider if:  Your symptoms do not improve after several days of treatment.  Your symptoms get worse.  You have  difficulty moving the injured area. Get help right away if:  You have severe pain.  You have numbness in a hand or foot.  Your hand or foot turns pale or cold. This information is not intended to replace advice given to you by your health care provider. Make sure you discuss any questions you have with your health care provider. Document Released: 08/03/2005 Document Revised: 03/03/2016 Document Reviewed: 03/11/2015 Elsevier Interactive Patient Education  2017 [place]  May use Norco as needed at bedtime only. Do not drink alcohol and take narcotic pain medication. Continue morning Meloxicam as needed, then may use OTC Tylenol for breakthough pain during the day.  Do not take other NSAIDs when taking Meloxicam. Apply ice for 20 mins several times daily to right hip. If pain does not improve in 1-2 weeks, please call clinic.

## 2017-01-02 NOTE — Assessment & Plan Note (Signed)
07/2016 cortisone injection in right hip. Continue morning Meloxicam as needed, then may use OTC Tylenol for breakthough pain during the day.  Do not take other NSAIDs when taking Meloxicam. Take breaks when working long hours, I.e. Sit, stretch.

## 2017-01-24 ENCOUNTER — Other Ambulatory Visit: Payer: Self-pay | Admitting: Adult Health

## 2017-01-24 ENCOUNTER — Ambulatory Visit: Payer: BLUE CROSS/BLUE SHIELD

## 2017-01-24 ENCOUNTER — Ambulatory Visit (INDEPENDENT_AMBULATORY_CARE_PROVIDER_SITE_OTHER): Payer: BLUE CROSS/BLUE SHIELD | Admitting: Adult Health

## 2017-01-24 ENCOUNTER — Encounter: Payer: Self-pay | Admitting: Adult Health

## 2017-01-24 VITALS — BP 138/79 | HR 70 | Ht 64.0 in | Wt 225.1 lb

## 2017-01-24 DIAGNOSIS — M25551 Pain in right hip: Secondary | ICD-10-CM

## 2017-01-24 MED ORDER — GABAPENTIN 300 MG PO CAPS: 300.0000 mg | ORAL_CAPSULE | Freq: Every day | ORAL | 0 refills | Status: DC

## 2017-01-24 NOTE — Patient Instructions (Addendum)
Hip Exercises Ask your health care provider which exercises are safe for you. Do exercises exactly as told by your health care provider and adjust them as directed. It is normal to feel mild stretching, pulling, tightness, or discomfort as you do these exercises, but you should stop right away if you feel sudden pain or your pain gets worse.Do not begin these exercises until told by your health care provider. STRETCHING AND RANGE OF MOTION EXERCISES  These exercises warm up your muscles and joints and improve the movement and flexibility of your hip. These exercises also help to relieve pain, numbness, and tingling. Exercise A: Hamstrings, Supine   1. Lie on your back. 2. Loop a belt or towel over the ball of your left / rightfoot. The ball of your foot is on the walking surface, right under your toes. 3. Straighten your left / rightknee and slowly pull on the belt to raise your leg.  Do not let your left / right knee bend while you do this.  Keep your other leg flat on the floor.  Raise the left / right leg until you feel a gentle stretch behind your left / right knee or thigh. 4. Hold this position for __________ seconds. 5. Slowly return your leg to the starting position. Repeat __________ times. Complete this stretch __________ times a day. Exercise B: Hip Rotators   1. Lie on your back on a firm surface. 2. Hold your left / right knee with your left / right hand. Hold your ankle with your other hand. 3. Gently pull your left / right knee and rotate your lower leg toward your other shoulder.  Pull until you feel a stretch in your buttocks.  Keep your hips and shoulders firmly planted while you do this stretch. 4. Hold this position for __________ seconds. Repeat __________ times. Complete this stretch __________ times a day. Exercise C: V-Sit (Hamstrings and Adductors)   1. Sit on the floor with your legs extended in a large "V" shape. Keep your knees straight during this  exercise. 2. Start with your head and chest upright, then bend at your waist to reach for your left foot (position A). You should feel a stretch in your right inner thigh. 3. Hold this position for __________ seconds. Then slowly return to the upright position. 4. Bend at your waist to reach forward (position B). You should feel a stretch behind both of your thighs and knees. 5. Hold this position for __________ seconds. Then slowly return to the upright position. 6. Bend at your waist to reach for your right foot (position C). You should feel a stretch in your left inner thigh. 7. Hold this position for __________ seconds. Then slowly return to the upright position. Repeat __________ times. Complete this stretch __________ times a day. Exercise D: Lunge (Hip Flexors)   1. Place your left / right knee on the floor and bend your other knee so that is directly over your ankle. You should be half-kneeling. 2. Keep good posture with your head over your shoulders. 3. Tighten your buttocks to point your tailbone downward. This helps your back to keep from arching too much. 4. You should feel a gentle stretch in the front of your left / right thigh and hip. If you do not feel any resistance, slightly slide your other foot forward and then slowly lunge forward so your knee once again lines up over your ankle. 5. Make sure your tailbone continues to point downward. 6. Hold this   position for __________ seconds. Repeat __________ times. Complete this stretch __________ times a day. STRENGTHENING EXERCISES  These exercises build strength and endurance in your hip. Endurance is the ability to use your muscles for a long time, even after they get tired. Exercise E: Bridge (Hip Extensors)   1. Lie on your back on a firm surface with your knees bent and your feet flat on the floor. 2. Tighten your buttocks muscles and lift your bottom off the floor until the trunk of your body is level with your thighs.  Do  not arch your back.  You should feel the muscles working in your buttocks and the back of your thighs. If you do not feel these muscles, slide your feet 1-2 inches (2.5-5 cm) farther away from your buttocks. 3. Hold this position for __________ seconds. 4. Slowly lower your hips to the starting position. 5. Let your muscles relax completely between repetitions. 6. If this exercise is too easy, try doing it with your arms crossed over your chest. Repeat __________ times. Complete this exercise __________ times a day. Exercise F: Straight Leg Raises - Hip Abductors   1. Lie on your side with your left / right leg in the top position. Lie so your head, shoulder, knee, and hip line up with each other. You may bend your bottom knee to help you balance. 2. Roll your hips slightly forward, so your hips are stacked directly over each other and your left / right knee is facing forward. 3. Leading with your heel, lift your top leg 4-6 inches (10-15 cm). You should feel the muscles in your outer hip lifting.  Do not let your foot drift forward.  Do not let your knee roll toward the ceiling. 4. Hold this position for __________ seconds. 5. Slowly return to the starting position. 6. Let your muscles relax completely between repetitions. Repeat __________ times. Complete this exercise __________ times a day. Exercise G: Straight Leg Raises - Hip Adductors   1. Lie on your side with your left / right leg in the bottom position. Lie so your head, shoulder, knee, and hip line up. You may place your upper foot in front to help you balance. 2. Roll your hips slightly forward, so your hips are stacked directly over each other and your left / right knee is facing forward. 3. Tense the muscles in your inner thigh and lift your bottom leg 4-6 inches (10-15 cm). 4. Hold this position for __________ seconds. 5. Slowly return to the starting position. 6. Let your muscles relax completely between  repetitions. Repeat __________ times. Complete this exercise __________ times a day. Exercise H: Straight Leg Raises - Quadriceps   1. Lie on your back with your left / right leg extended and your other knee bent. 2. Tense the muscles in the front of your left / right thigh. When you do this, you should see your kneecap slide up or see increased dimpling just above your knee. 3. Tighten these muscles even more and raise your leg 4-6 inches (10-15 cm) off the floor. 4. Hold this position for __________ seconds. 5. Keep these muscles tense as you lower your leg. 6. Relax the muscles slowly and completely between repetitions. Repeat __________ times. Complete this exercise __________ times a day. Exercise I: Hip Abductors, Standing  1. Tie one end of a rubber exercise band or tubing to a secure surface, such as a table or pole. 2. Loop the other end of the band or tubing   around your left / right ankle. 3. Keeping your ankle with the band or tubing directly opposite of the secured end, step away until there is tension in the tubing or band. Hold onto a chair as needed for balance. 4. Lift your left / right leg out to your side. While you do this:  Keep your back upright.  Keep your shoulders over your hips.  Keep your toes pointing forward.  Make sure to use your hip muscles to lift your leg. Do not "throw" your leg or tip your body to lift your leg. 5. Hold this position for __________ seconds. 6. Slowly return to the starting position. Repeat __________ times. Complete this exercise __________ times a day. Exercise J: Squats (Quadriceps)  1. Stand in a door frame so your feet and knees are in line with the frame. You may place your hands on the frame for balance. 2. Slowly bend your knees and lower your hips like you are going to sit in a chair.  Keep your lower legs in a straight-up-and-down position.  Do not let your hips go lower than your knees.  Do not bend your knees lower than  told by your health care provider.  If your hip pain increases, do not bend as low. 3. Hold this position for ___________ seconds. 4. Slowly push with your legs to return to standing. Do not use your hands to pull yourself to standing. Repeat __________ times. Complete this exercise __________ times a day. This information is not intended to replace advice given to you by your health care provider. Make sure you discuss any questions you have with your health care provider. Document Released: 11/11/2005 Document Revised: 07/18/2016 Document Reviewed: 10/19/2015 Elsevier Interactive Patient Education  2017 [employer]   Hip Pain The hip is the joint between the upper legs and the lower pelvis. The bones, cartilage, tendons, and muscles of your hip joint support your body and allow you to move around. Hip pain can range from a minor ache to severe pain in one or both of your hips. The pain may be felt on the inside of the hip joint near the groin, or the outside near the buttocks and upper thigh. You may also have swelling or stiffness. Follow these instructions at home: Managing pain, stiffness, and swelling   If directed, apply ice to the injured area.  Put ice in a plastic bag.  Place a towel between your skin and the bag.  Leave the ice on for 20 minutes, 2-3 times a day  Sleep with a pillow between your legs on your most comfortable side.  Avoid any activities that cause pain. General instructions   Take over-the-counter and prescription medicines only as told by your health care provider.  Do any exercises as told by your health care provider.  Record the following:  How often you have hip pain.  The location of your pain.  What the pain feels like.  What makes the pain worse.  Keep all follow-up visits as told by your health care provider. This is important. Contact a health care provider if:  You cannot put weight on your leg.  Your pain or swelling continues  or gets worse after one week.  It gets harder to walk.  You have a fever. Get help right away if:  You fall.  You have a sudden increase in pain and swelling in your hip.  Your hip is red or swollen or very tender to touch. Summary  Hip pain can range from a minor ache to severe pain in one or both of your hips.  The pain may be felt on the inside of the hip joint near the groin, or the outside near the buttocks and upper thigh.  Avoid any activities that cause pain.  Record how often you have hip pain, the location of the pain, what makes it worse and what it feels like. This information is not intended to replace advice given to you by your health care provider. Make sure you discuss any questions you have with your health care provider. Document Released: 04/13/2010 Document Revised: 09/26/2016 Document Reviewed: 09/26/2016 Elsevier Interactive Patient Education  2017 [employer]  Alternate OTC Acetaminophen and Ibuprofen per [employer] instructions. Use Neurontin as needed at night for burning pain. Perform hip exercises as directed. Will call with Xrays results when available. If pain still persists in 4 weeks (DOI- 12/22/2016), then will refer to PT/Orthopedic Specialist.

## 2017-01-24 NOTE — Addendum Note (Signed)
Addended by: Fonnie Mu on: 01/24/2017 02:13 PM   Modules accepted: Orders

## 2017-01-24 NOTE — Addendum Note (Signed)
Addended by: Marily Lente on: 01/24/2017 10:55 AM   Modules accepted: Orders

## 2017-01-24 NOTE — Assessment & Plan Note (Addendum)
Alternate OTC Acetaminophen and Ibuprofen per manufacturer's instructions. Use Neurontin as needed at night for burning pain. Perform hip exercises as directed. Will call with Xrays results when available. If pain still persists in 4 weeks (DOI- 12/22/2016), then will refer to PT/Orthopedic Specialist.

## 2017-01-24 NOTE — Progress Notes (Signed)
Subjective:    Patient ID: Dawn Alexander, female    DOB: [birth year], 61 y.o.   MRN: [id number]  HPI:  Dawn Alexander presents for continued right hip pain that is 8/10 during the day and increases to 10/10 at night when she is trying to sleep.  She describes the pain as throbbing, burning and radiates down leg to lateral right calf.  She reports insomnia r/t pain.  She completed short course of Hydrocodone/Acetaminophen-which she reports helped.  She now has been using OTC Acetaminophen and Ibuprofen.  Shew has not been resting (works at [employer] and on her feet all shift and in the middle of a home move) or using ice.  She denies bowel/bladder dysfunction.  She denies difficulty ambulating or instability of R hip/knee.     Patient Care Team    Relationship Specialty Notifications Start End  [name], DO PCP - General Family Medicine  04/15/16     Patient Active Problem List   Diagnosis Date Noted  . Contusion of right hip 01/02/2017  . Acute right hip pain 01/02/2017  . Greater trochanteric bursitis of right hip 09/23/2016  . Right lower extremity edema 07/01/2016  . Chronic foot pain 07/01/2016  . Venous (peripheral) insufficiency- B\L L Ext 07/01/2016  . Chronic pain in right foot 07/01/2016  . Increased vitamin B12 level due to excess supplements 05/17/2016  . Restless legs syndrome (RLS) 05/17/2016  . seasonal or environmental allergies 05/16/2016  . Insomnia 04/18/2016  . Vitamin D deficiency 04/18/2016  . Morbidly Obesity- 39.89 BMI 04/18/2016  . type II diabetes mellitus- diet controlled 04/18/2016  . GERD (gastroesophageal reflux disease) 04/18/2016  . Screening for STD (sexually transmitted disease) 04/18/2016  . HLD (hyperlipidemia) 04/15/2016  . Hypertension 04/15/2016  . hypothyroidism 04/15/2016  . Chronic pain 11/30/2011     Past Medical History:  Diagnosis Date  . Allergy   . Insomnia   . Vitamin D deficiency      Past Surgical History:  Procedure  Laterality Date  . ELBOW SURGERY Right 1999     Family History  Problem Relation Age of Onset  . Diabetes Mother   . COPD Mother   . Cancer Mother     lung  . Multiple sclerosis Father   . Heart disease Father   . Cancer Sister     lung  . Stroke Sister   . Cancer Brother     bladder  . Diabetes Daughter   . Stroke Sister   . Leukemia Sister   . Alcohol abuse Brother   . Healthy Brother   . Healthy Daughter      History  Drug Use No     History  Alcohol Use  . 3.0 oz/week  . 5 Shots of liquor per week     History  Smoking Status  . Never Smoker  Smokeless Tobacco  . Never Used     Outpatient Encounter Prescriptions as of 01/24/2017  Medication Sig Note  . cholecalciferol (VITAMIN D) 1000 units tablet Take 5,000 Units by mouth daily.   Marland Kitchen levothyroxine (SYNTHROID, LEVOTHROID) 100 MCG tablet Take 1 tablet (100 mcg total) by mouth daily.   . Melatonin 5 MG TABS Take 1 tablet by mouth at bedtime.   . meloxicam (MOBIC) 15 MG tablet TAKE ONE TABLET BY MOUTH ONCE DAILY   . Multiple Vitamins-Minerals (B COMPLEX PLUS VITAMIN C PO) Take 1 tablet by mouth daily. 04/15/2016: Received from: [facility]: Take by mouth.  Marland Kitchen  ranitidine (ZANTAC) 150 MG capsule Take 1 capsule (150 mg total) by mouth daily.   Marland Kitchen rOPINIRole (REQUIP) 1 MG tablet Take 1 tablet (1 mg total) by mouth at bedtime.   . gabapentin (NEURONTIN) 300 MG capsule Take 1 capsule (300 mg total) by mouth at bedtime.   Marland Kitchen HYDROcodone-acetaminophen (NORCO/VICODIN) 5-325 MG tablet Take 1-2 tablets by mouth at bedtime as needed for severe pain. (Patient not taking: Reported on 01/24/2017)    No facility-administered encounter medications on file as of 01/24/2017.     Allergies: Patient has no known allergies.  Body mass index is 38.64 kg/m.  Blood pressure 138/79, pulse 70, height 5\' 4"  (1.626 m), weight 225 lb 1.9 oz (102.1 kg), SpO2 98 %.     Review of Systems     Objective:   Physical  Exam  Constitutional: She is oriented to person, place, and time. She appears well-developed and well-nourished. No distress.  Musculoskeletal: Normal range of motion. She exhibits edema and tenderness. She exhibits no deformity.       Right hip: She exhibits tenderness and swelling. She exhibits normal range of motion, normal strength, no bony tenderness, no crepitus, no deformity and no laceration.       Left hip: She exhibits normal range of motion, normal strength, no tenderness, no bony tenderness and no swelling.       Right knee: She exhibits normal range of motion and no swelling. No tenderness found.       Lumbar back: She exhibits no tenderness, no swelling and no edema.  Neurological: She is alert and oriented to person, place, and time. Coordination normal.  Skin: Skin is warm and dry. No rash noted. She is not diaphoretic. No erythema. No pallor.  Psychiatric: She has a normal mood and affect. Her behavior is normal. Judgment and thought content normal.  Nursing note and vitals reviewed.         Assessment & Plan:   1. Acute right hip pain     Acute right hip pain Alternate OTC Acetaminophen and Ibuprofen per manufacturer's instructions. Use Neurontin as needed at night for burning pain. Perform hip exercises as directed. Will call with Xrays results when available. If pain still persists in 4 weeks (DOI- 12/22/2016), then will refer to PT/Orthopedic Specialist.    FOLLOW-UP:  Return if symptoms worsen or fail to improve.

## 2017-01-24 NOTE — Progress Notes (Unsigned)
CT

## 2017-01-25 ENCOUNTER — Encounter: Payer: Self-pay | Admitting: Family Medicine

## 2017-01-25 ENCOUNTER — Ambulatory Visit
Admission: RE | Admit: 2017-01-25 | Discharge: 2017-01-25 | Disposition: A | Discharge status: Home / Self Care | Payer: BLUE CROSS/BLUE SHIELD | Source: Ambulatory Visit | Attending: Adult Health | Admitting: Adult Health

## 2017-01-25 ENCOUNTER — Other Ambulatory Visit: Payer: Self-pay | Admitting: Adult Health

## 2017-01-25 DIAGNOSIS — M25551 Pain in right hip: Secondary | ICD-10-CM

## 2017-01-25 DIAGNOSIS — D259 Leiomyoma of uterus, unspecified: Secondary | ICD-10-CM

## 2017-01-25 NOTE — Progress Notes (Signed)
Called pt and reviewed Xray and CT results in detail. Continue treatment for right hip as discussed during yesterday's encounter: Alternate OTC Acetaminophen and Ibuprofen per manufacturer's instructions. Use Neurontin as needed at night for burning pain. Perform hip exercises as directed. If pain still persists in 4 weeks (DOI- 12/22/2016), then will refer to PT/Orthopedic Specialist. She continues to have pain and would like the opportunity to rest/recover and her job requires her to be on her feet 8-10- hrs a shift. She requests a work note to allow for healing-provided. CT scan revealed a large uterine fibroid.  She denies abdominal/pelvic pain or vaginal bleeding and denies previous hx of fibroids. Referral for OB/GYN placed. She was instructed to call clinic with any questions/concerns.

## 2017-01-31 ENCOUNTER — Ambulatory Visit (INDEPENDENT_AMBULATORY_CARE_PROVIDER_SITE_OTHER): Payer: BLUE CROSS/BLUE SHIELD | Admitting: Obstetrics and Gynecology

## 2017-01-31 ENCOUNTER — Encounter: Payer: Self-pay | Admitting: Obstetrics and Gynecology

## 2017-01-31 VITALS — BP 140/98 | HR 64 | Resp 16 | Ht 63.25 in | Wt 224.0 lb

## 2017-01-31 DIAGNOSIS — D259 Leiomyoma of uterus, unspecified: Secondary | ICD-10-CM

## 2017-01-31 DIAGNOSIS — R3915 Urgency of urination: Secondary | ICD-10-CM

## 2017-01-31 DIAGNOSIS — R350 Frequency of micturition: Secondary | ICD-10-CM | POA: Diagnosis not present

## 2017-01-31 NOTE — Patient Instructions (Signed)

## 2017-01-31 NOTE — Progress Notes (Signed)
61 y.o. [id number] MarriedCaucasianF here referred by Lillard Anes, NP for a new diagnosis of uterine fibroids. She had a CT scan on 3/21 for hip pain and was incidentally noted to have a "large fibroid uterus", normal adnexa. She has bursitis in her right hip, then fell and injured her hip. She has been having bad pain. She is PMP, LMP around 2010. No abdominal pain, no vaginal bleeding. Sexually active without intercourse (ED), no pain.  Never had heavy cycles.  She c/o a 2-3 year h/o frequent urination. She notices that when she has tea it goes right through her. She also takes Excedrin with caffeine for her headaches. If not drinking tea she can go 4-5 hours in between voids. She voids normal amounts. No leakage. Some urgency to void. She is up 2-3 x a night to void. Often has tea at dinner. She sometimes has a wine cooler at night.     No LMP recorded. Patient is postmenopausal.          Sexually active: Yes.    The current method of family planning is postmenopause.    Exercising: No.  The patient does not participate in regular exercise at present. Smoker:  no  Health Maintenance: Pap:  02/05/16 WNL   History of abnormal Pap:  no MMG:  01-08-16 WNL  Colonoscopy:  2010 Normal per patient  BMD:   unsure TDaP:  03-12-14 Gardasil: N/A   reports that she has never smoked. She has never used smokeless tobacco. She reports that she drinks about 3.0 oz of alcohol per week . She reports that she does not use drugs.  Past Medical History:  Diagnosis Date  . Allergy   . Chronic lower back pain   . Fibroid   . Insomnia   . Restless leg syndrome   . Thyroid disease   . Vitamin D deficiency     Past Surgical History:  Procedure Laterality Date  . ELBOW SURGERY Right 1999    Current Outpatient Prescriptions  Medication Sig Dispense Refill  . Acetaminophen-Caffeine (EXCEDRIN ASPIRIN FREE PO) Take by mouth daily as needed.    . cholecalciferol (VITAMIN D) 1000 units tablet Take 5,000 Units by  mouth daily.    . Cyanocobalamin (B-12) 1000 MCG CAPS     . gabapentin (NEURONTIN) 300 MG capsule Take 1 capsule (300 mg total) by mouth at bedtime. 60 capsule 0  . levothyroxine (SYNTHROID, LEVOTHROID) 100 MCG tablet Take 1 tablet (100 mcg total) by mouth daily. 90 tablet 1  . Melatonin 5 MG TABS Take 1 tablet by mouth at bedtime.    . meloxicam (MOBIC) 15 MG tablet TAKE ONE TABLET BY MOUTH ONCE DAILY 90 tablet 1  . Multiple Vitamins-Minerals (B COMPLEX PLUS VITAMIN C PO) Take 1 tablet by mouth daily.    . ranitidine (ZANTAC) 150 MG capsule Take 1 capsule (150 mg total) by mouth daily. 90 capsule 1  . rOPINIRole (REQUIP) 1 MG tablet Take 1 tablet (1 mg total) by mouth at bedtime. 90 tablet 1   No current facility-administered medications for this visit.     Family History  Problem Relation Age of Onset  . Diabetes Mother   . COPD Mother   . Cancer Mother     lung  . Multiple sclerosis Father   . Heart disease Father   . Cancer Sister     lung  . Stroke Sister   . Cancer Brother     bladder  . Stroke Sister   .  Leukemia Sister   . Alcohol abuse Brother   . Healthy Brother   . Healthy Daughter   . Leukemia Brother 5    Review of Systems  Constitutional: Negative.   HENT: Negative.   Eyes: Negative.   Respiratory: Negative.   Cardiovascular: Negative.   Gastrointestinal: Negative.   Endocrine: Negative.   Genitourinary: Negative.   Musculoskeletal:       Right hip pain   Skin: Negative.   Allergic/Immunologic: Negative.   Neurological: Negative.   Psychiatric/Behavioral: Negative.     Exam:   BP (!) 140/98 (BP Location: Right Arm, Patient Position: Sitting, Cuff Size: Large)   Pulse 64   Resp 16   Ht 5' 3.25" (1.607 m)   Wt 224 lb (101.6 kg)   BMI 39.37 kg/m   Weight change: @WEIGHTCHANGE @ Height:   Height: 5' 3.25" (160.7 cm)  Ht Readings from Last 3 Encounters:  01/31/17 5' 3.25" (1.607 m)  01/24/17 5\' 4"  (1.626 m)  01/02/17 5' 3.75" (1.619 m)     General appearance: alert, cooperative and appears stated age Abdomen: soft, non-tender; bowel sounds normal; no masses,  no organomegaly   Pelvic: External genitalia:  no lesions              Urethra:  normal appearing urethra with no masses, tenderness or lesions              Bartholins and Skenes: normal                 Vagina: normal appearing atrophic vagina with normal color and discharge, no lesions              Cervix: no lesions               Bimanual Exam:  Uterus:  anteverted, mobile, slightly enlarged, irregular on the left side, not tender              Adnexa: no mass, fullness, tenderness               Rectovaginal: Confirms               Anus:  normal sphincter tone, no lesions  Chaperone was present for exam.  CT report and images were reviewed  A:  Incidental finding of fibroid uterus on CT scan, no symptoms, no worrisome features on exam  CT report and images reviewed with the patient, suspect she has had the fibroids for a long time (I would not have described the CT findings of the uterus as a "large fibroid uterus")  Urinary frequency and urgency, suspect from her caffeine intake  P:   Discussed fibroid uterus  F/U exam in 6 months to document stability  Will request the radiologist measure her uterus  Call with any questions or concerns  Try and decrease her caffeine intake  Will check urine for ua, c&s   CC: Lillard Anes, NP Letter sent

## 2017-02-01 LAB — URINALYSIS, MICROSCOPIC ONLY
BACTERIA UA: NONE SEEN [HPF]
Casts: NONE SEEN [LPF]
Crystals: NONE SEEN [HPF]
RBC / HPF: NONE SEEN RBC/HPF (ref <=2)
Squamous Epithelial / LPF: NONE SEEN [HPF] (ref <=5)
WBC, UA: NONE SEEN WBC/HPF (ref <=5)
Yeast: NONE SEEN [HPF]

## 2017-02-01 LAB — URINE CULTURE: Organism ID, Bacteria: NO GROWTH

## 2017-03-03 LAB — HM MAMMOGRAPHY

## 2017-03-16 ENCOUNTER — Encounter: Payer: Self-pay | Admitting: Family Medicine

## 2017-03-16 ENCOUNTER — Ambulatory Visit (INDEPENDENT_AMBULATORY_CARE_PROVIDER_SITE_OTHER): Payer: BLUE CROSS/BLUE SHIELD | Admitting: Family Medicine

## 2017-03-16 VITALS — BP 120/80 | HR 75 | Ht 63.25 in | Wt 229.7 lb

## 2017-03-16 DIAGNOSIS — M5136 Other intervertebral disc degeneration, lumbar region: Secondary | ICD-10-CM | POA: Diagnosis not present

## 2017-03-16 DIAGNOSIS — N3941 Urge incontinence: Secondary | ICD-10-CM

## 2017-03-16 DIAGNOSIS — G2581 Restless legs syndrome: Secondary | ICD-10-CM | POA: Diagnosis not present

## 2017-03-16 DIAGNOSIS — S7001XS Contusion of right hip, sequela: Secondary | ICD-10-CM

## 2017-03-16 DIAGNOSIS — G4700 Insomnia, unspecified: Secondary | ICD-10-CM | POA: Diagnosis not present

## 2017-03-16 DIAGNOSIS — M25551 Pain in right hip: Secondary | ICD-10-CM | POA: Diagnosis not present

## 2017-03-16 DIAGNOSIS — K5730 Diverticulosis of large intestine without perforation or abscess without bleeding: Secondary | ICD-10-CM | POA: Insufficient documentation

## 2017-03-16 DIAGNOSIS — G8929 Other chronic pain: Secondary | ICD-10-CM

## 2017-03-16 DIAGNOSIS — M25559 Pain in unspecified hip: Secondary | ICD-10-CM

## 2017-03-16 MED ORDER — TOLTERODINE TARTRATE ER 2 MG PO CP24: 2.0000 mg | ORAL_CAPSULE | Freq: Every day | ORAL | 3 refills | Status: DC

## 2017-03-16 MED ORDER — ROPINIROLE HCL 4 MG PO TABS: 2.0000 mg | ORAL_TABLET | Freq: Two times a day (BID) | ORAL | 0 refills | Status: DC

## 2017-03-16 MED ORDER — GABAPENTIN 300 MG PO CAPS: 300.0000 mg | ORAL_CAPSULE | Freq: Three times a day (TID) | ORAL | 0 refills | Status: DC

## 2017-03-16 NOTE — Patient Instructions (Addendum)
For the Neurontin, this is a medicine that can potentially cause side effects and we will need to go up on the dose slowly over time.  - Start off with taking one Neurontin cap every evening for 3 days, if well tolerated then-  - take 1 cap twice daily for 3 days, if still well tolerated then-   - take 1 cap 3 times daily for 3 days, if tolerated then-   - take 1 in the morning, 1 in the afternoon and 2 caps at night for 3 days, if well tolerated-  (such as:  0,0,1 * 3 d 0,1,1 * 3 days,  1,1,1, * 3 days 1,1,2 * 3 days 1,2,2 * 3 days)   - take 1 in the morning,  2 in the afternoon, and 2 at night for 3 days and then slowly work your way up to 2 caps 3 times a day, then   - take 2, 2, 3 caps * 3 days etc.       --> can go up to 1200mg  three times daily if tolerated and still need pain control, BUT ONLY TAKE AMNT NEEDED FOR GOOD PAIN CONTROL.    We will go up on the dose and give you a new prescription once you are running low ( down to about 1 week of pills).   _____________________________________________________  --->  You may not need to take your sleep medicines with the inc in doses of neurontin.  Take melatonin see how you feel     --> let me know if you would like referral to sprts med for hip injection in future.      Kegel Exercises Kegel exercises help strengthen the muscles that support the rectum, vagina, small intestine, bladder, and uterus. Doing Kegel exercises can help:  Improve bladder and bowel control.  Improve sexual response.  Reduce problems and discomfort during pregnancy. Kegel exercises involve squeezing your pelvic floor muscles, which are the same muscles you squeeze when you try to stop the flow of urine. The exercises can be done while sitting, standing, or lying down, but it is best to vary your position. Phase 1 exercises 1. Squeeze your pelvic floor muscles tight. You should feel a tight lift in your rectal area. If you are a female, you  should also feel a tightness in your vaginal area. Keep your stomach, buttocks, and legs relaxed. 2. Hold the muscles tight for up to 10 seconds. 3. Relax your muscles. Repeat this exercise 50 times a day or as many times as told by your health care provider. Continue to do this exercise for at least 4-6 weeks or for as long as told by your health care provider. This information is not intended to replace advice given to you by your health care provider. Make sure you discuss any questions you have with your health care provider. Document Released: 10/10/2012 Document Revised: 06/18/2016 Document Reviewed: 09/13/2015 Elsevier Interactive Patient Education  2017 [employer]     Urinary Incontinence Urinary incontinence is the involuntary loss of urine from your bladder. What are the causes? There are many causes of urinary incontinence. They include:  Medicines.  Infections.  Prostatic enlargement, leading to overflow of urine from your bladder.  Surgery.  Neurological diseases.  Emotional factors. What are the signs or symptoms? Urinary Incontinence can be divided into four types: 4. Urge incontinence. Urge incontinence is the involuntary loss of urine before you have the opportunity to go to the bathroom. There is a  sudden urge to void but not enough time to reach a bathroom. 5. Stress incontinence. Stress incontinence is the sudden loss of urine with any activity that forces urine to pass. It is commonly caused by anatomical changes to the pelvis and sphincter areas of your body. 6. Overflow incontinence. Overflow incontinence is the loss of urine from an obstructed opening to your bladder. This results in a backup of urine and a resultant buildup of pressure within the bladder. When the pressure within the bladder exceeds the closing pressure of the sphincter, the urine overflows, which causes incontinence, similar to water overflowing a dam. 7. Total incontinence. Total  incontinence is the loss of urine as a result of the inability to store urine within your bladder. How is this diagnosed? Evaluating the cause of incontinence may require:  A thorough and complete medical and obstetric history.  A complete physical exam.  Laboratory tests such as a urine culture and sensitivities. When additional tests are indicated, they can include:  An ultrasound exam.  Kidney and bladder X-rays.  Cystoscopy. This is an exam of the bladder using a narrow scope.  Urodynamic testing to test the nerve function to the bladder and sphincter areas. How is this treated? Treatment for urinary incontinence depends on the cause:  For urge incontinence caused by a bacterial infection, antibiotics will be prescribed. If the urge incontinence is related to medicines you take, your health care provider may have you change the medicine.  For stress incontinence, surgery to re-establish anatomical support to the bladder or sphincter, or both, will often correct the condition.  For overflow incontinence caused by an enlarged prostate, an operation to open the channel through the enlarged prostate will allow the flow of urine out of the bladder. In women with fibroids, a hysterectomy may be recommended.  For total incontinence, surgery on your urinary sphincter may help. An artificial urinary sphincter (an inflatable cuff placed around the urethra) may be required. In women who have developed a hole-like passage between their bladder and vagina (vesicovaginal fistula), surgery to close the fistula often is required. Follow these instructions at home:  Normal daily hygiene and the use of pads or adult diapers that are changed regularly will help prevent odors and skin damage.  Avoid caffeine. It can overstimulate your bladder.  Use the bathroom regularly. Try about every 2-3 hours to go to the bathroom, even if you do not feel the need to do so. Take time to empty your bladder  completely. After urinating, wait a minute. Then try to urinate again.  For causes involving nerve dysfunction, keep a log of the medicines you take and a journal of the times you go to the bathroom. Contact a health care provider if:  You experience worsening of pain instead of improvement in pain after your procedure.  Your incontinence becomes worse instead of better. Get help right away if:  You experience fever or shaking chills.  You are unable to pass your urine.  You have redness spreading into your groin or down into your thighs. This information is not intended to replace advice given to you by your health care provider. Make sure you discuss any questions you have with your health care provider. Document Released: 12/01/2004 Document Revised: 06/03/2016 Document Reviewed: 04/02/2013 Elsevier Interactive Patient Education  2017 [employer].

## 2017-03-16 NOTE — Progress Notes (Signed)
Impression and Recommendations:    1. Chronic pain of right hip   2. Degenerative disc disease, lumbar- noted on CT 01-25-17   3. Contusion of right hip, sequela   4. Restless legs syndrome (RLS)   5. Insomnia, unspecified type   6. Urinary incontinence, urge    For RLS and chronic hip pain ( d/c pt could be coming from L-spine after recent CT scan)  - Patient on low-dose ropinirole 1mg  qhs as well as Neurontin has room to titrate up.  We will increase both doses.   - INC requip dose from 1 mg q hs-->  to a max of 4mg  q hs.  She will titrate up dose over time to achieve pain relief from RLS.  Has requip script alreadyand pt will inc dose to see if better efficacy achieved.  Hopefully this will help her restless legs with the new higher dose of Requip. - New script given for neurontin,  - For the Neurontin, this is a medicine that can potentially cause side effects and we will need to go up on the dose slowly over time.  See AVS for slow titration of dosing instructions given to patient.   Insomnia --->   You may not need to take your sleep medicines with the inc in doses of neurontin.   - Take melatonin, see how you feel.  - Sleep hygiene d/c pt   R hip pain ---> explained to patient with only mild tenderness of her greater trochanteric region, doubtful another injection would be helpful today.    CT was negative For hip arthritis or fracture etc. but I did tell patient to let me know if she would like referral to sprts med or ortho for further evaluation.    - Did discuss with patient that there was degenerative disc disease in her lumbar spine and pain can be coming from there.   - She declined any further workup or MRI of her L-spine at this time.   Urinary incontinence:  - We discussed importance of improving pelvic musculature.  Patient declines physical therapy for this.  Handouts on Kegel exercises given to patient. - Patient declines referral to urology - Prescription  for Detrol LA given after patient declined further evaluation or workup, and risks and benefits of med were discussed - Given handout on urinary incontinence  The patient was counseled, risk factors were discussed, anticipatory guidance given.  - see med list from today for dose changes.   - Pt was in the office today for 40+ minutes, with over 50% time spent in face to face counseling of patients various medical conditions, treatment plans of those medical conditions including medicine management and lifestyle modification, strategies to improve health and well being; and in coordination of care. SEE ABOVE FOR DETAILS   Gross side effects, risk and benefits, and alternatives of medications and treatment plan in general discussed with patient.  Patient is aware that all medications have potential side effects and we are unable to predict every side effect or drug-drug interaction that may occur.   Patient will call with any questions prior to using medication if they have concerns.  Expresses verbal understanding and consents to current therapy and treatment regimen.  No barriers to understanding were identified.  Red flag symptoms and signs discussed in detail.  Patient expressed understanding regarding what to do in case of emergency\urgent symptoms  Please see AVS handed out to patient at the end of our visit  for further patient instructions/ counseling done pertaining to today's office visit.   Return in about 4 weeks (around 04/13/2017) for also make appt for CPE in mid july/aug as well.     Note: This document was prepared using Dragon voice recognition software and may include unintentional dictation errors.  [name] 11:37 AM --------------------------------------------------------------------------------------------------------------------------------------------------------------------------------------------------------------------------------------------    Subjective:     CC:  Chief Complaint  Patient presents with  . Hip Pain    right    HPI: Dawn Alexander is a 61 y.o. female who presents to [place] at [facility] today for issues as discussed below.   Fell on 12/22/16 - contused hip and xrays as well as CT scan was negative for fx on 01/25/17.   Gave pt vicoden and and started gabapentin in a f/up OV on 01/24/17.   Now her R pain is still bad.   I had seen patient for right hip pain back in November 2017 of 6-8 months duration.  She had no injury or ensuing event that caused this pain.  At that time I gave her greater trochanteric bursitis injection which she tolerated well and worked well for her.  RLS- worse since injury and Neurontin is no help, which my colleague started her on for the neuropathic type symptoms related to her right hip pain that would seem to be worse at night and she had difficulty sleeping.  - husband retired today and pt is on a 12 wk leave of absence- then will be fully retired from [employer].  Patient still working at [employer] and is on her feet all day.  Patient is chills over this and causing some tension between her and her husband.  Otherwise mood stable.  Urinary incontinence worsening now- for 3 months now.     Did kegel exercises at home occasionally--> but never consistently.  Did help some.   Wt Readings from Last 3 Encounters:  05/17/17 229 lb 14.4 oz (104.3 kg)  04/05/17 230 lb 3.2 oz (104.4 kg)  03/16/17 229 lb 11.2 oz (104.2 kg)   BP Readings from Last 3 Encounters:  05/17/17 120/77  04/05/17 120/78  03/16/17 120/80   Pulse Readings from Last 3 Encounters:  05/17/17 76  04/05/17 67  03/16/17 75   BMI Readings from Last 3 Encounters:  05/17/17 40.40 kg/m  04/05/17 40.46 kg/m  03/16/17 40.37 kg/m     Patient Care Team    Relationship Specialty Notifications Start End  [name], DO PCP - General Family Medicine  04/15/16   Salvadore Dom, MD Consulting Physician  Obstetrics and Gynecology  03/16/17      Patient Active Problem List   Diagnosis Date Noted  . Class 3 obesity with serious comorbidity and body mass index (BMI) of 40.0 to 44.9 in adult [facility]) 04/18/2016    Priority: High  . type II diabetes mellitus- diet controlled 04/18/2016    Priority: High  . h/o HLD (hyperlipidemia) 04/15/2016    Priority: High  . Hypertension 04/15/2016    Priority: High  . hypothyroidism 04/15/2016    Priority: High  . Uterine fibroid- noted on CT scan from 3\21\18 07/15/2017    Priority: Medium  . Degenerative disc disease, lumbar- noted on CT 01-25-17 07/15/2017    Priority: Medium  . Venous (peripheral) insufficiency- B\L L Ext 07/01/2016    Priority: Medium  . Restless legs syndrome (RLS) 05/17/2016    Priority: Medium  . Insomnia 04/18/2016    Priority: Medium  .  GERD (gastroesophageal reflux disease) 04/18/2016    Priority: Medium  . Urinary incontinence, urge 03/16/2017    Priority: Low  . Greater trochanteric bursitis of right hip 09/23/2016    Priority: Low  . Vitamin D deficiency 04/18/2016    Priority: Low  . Chronic hip pain- R 03/16/2017  . Diverticulosis of sigmoid colon 03/16/2017  . Contusion of right hip- fell 12/22/16 01/02/2017  . Right lower extremity edema 07/01/2016  . Chronic foot pain 07/01/2016  . Chronic pain in right foot 07/01/2016  . Increased vitamin B12 level due to excess supplements 05/17/2016  . seasonal or environmental allergies 05/16/2016  . Chronic pain 11/30/2011    Past Medical history, Surgical history, Family history, Social history, Allergies and Medications have been entered into the medical record, reviewed and changed as needed.    Current Meds  Medication Sig  . Acetaminophen-Caffeine (EXCEDRIN ASPIRIN FREE PO) Take by mouth daily as needed.  . [DISCONTINUED] cholecalciferol (VITAMIN D) 1000 units tablet Take 5,000 Units by mouth daily.  . [DISCONTINUED] Cyanocobalamin (B-12) 1000 MCG CAPS   .  [DISCONTINUED] gabapentin (NEURONTIN) 300 MG capsule Take 1 capsule (300 mg total) by mouth at bedtime.  . [DISCONTINUED] gabapentin (NEURONTIN) 300 MG capsule Take 1 capsule (300 mg total) by mouth 3 (three) times daily.  . [DISCONTINUED] levothyroxine (SYNTHROID, LEVOTHROID) 100 MCG tablet Take 1 tablet (100 mcg total) by mouth daily.  . [DISCONTINUED] meloxicam (MOBIC) 15 MG tablet TAKE ONE TABLET BY MOUTH ONCE DAILY  . [DISCONTINUED] Multiple Vitamins-Minerals (B COMPLEX PLUS VITAMIN C PO) Take 1 tablet by mouth daily.  . [DISCONTINUED] ranitidine (ZANTAC) 150 MG capsule Take 1 capsule (150 mg total) by mouth daily.  . [DISCONTINUED] rOPINIRole (REQUIP) 1 MG tablet Take 1 tablet (1 mg total) by mouth at bedtime.  . [DISCONTINUED] rOPINIRole (REQUIP) 4 MG tablet Take 0.5 tablets (2 mg total) by mouth 2 (two) times daily.    Allergies:  No Known Allergies   Review of Systems: General:   Denies fever, chills, unexplained weight loss.  Optho/Auditory:   Denies visual changes, blurred vision/LOV Respiratory:   Denies wheeze, DOE more than baseline levels.  Cardiovascular:   Denies chest pain, palpitations, new onset peripheral edema  Gastrointestinal:   Denies nausea, vomiting, diarrhea, abd pain.  Genitourinary: Denies dysuria, freq/ urgency, flank pain or discharge from genitals.  Endocrine:     Denies hot or cold intolerance, polyuria, polydipsia. Musculoskeletal:   Denies unexplained myalgias, joint swelling, unexplained arthralgias, gait problems.  Skin:  Denies new onset rash, suspicious lesions Neurological:     Denies dizziness, unexplained weakness, numbness  Psychiatric/Behavioral:   Denies mood changes, suicidal or homicidal ideations, hallucinations    Objective:   Blood pressure 120/80, pulse 75, height 5' 3.25" (1.607 m), weight 229 lb 11.2 oz (104.2 kg). Body mass index is 40.37 kg/m. General:  Well Developed, well nourished, appropriate for stated age.  Neuro:  Alert  and oriented,  extra-ocular muscles intact  HEENT:  Normocephalic, atraumatic, neck supple, no carotid bruits appreciated  Skin:  no gross rash, warm, pink. Cardiac:  RRR Respiratory: Not using accessory muscles, speaking in full sentences- unlabored. Hip: ROM- R- significantly decreased due to pain and body habitus. Pelvic alignment unremarkable to inspection and palpation. Standing hip rotation and gait without trendelenburg sign / unsteadiness. Greater trochanter with very mild tenderness to palpation again- improved from prior exams. Some tenderness over piriformis. Unable to perform FABERs. No SI joint tenderness Vascular:  Ext warm, no  cyanosis apprec.; cap RF less 2 sec. Psych:  No HI/SI, judgement and insight good, Euthymic mood. Full Affect.

## 2017-03-24 ENCOUNTER — Telehealth: Payer: Self-pay | Admitting: Family Medicine

## 2017-03-24 NOTE — Telephone Encounter (Signed)
Pt called states she is out on the road but wanted to know is spouse Randall Dawn Alexander) could be prescribed the Gabapentin medication since he has the same condition as her?-- advised that  Provider& nurse have gone for the day.-- pt will call back on Monday.--glh

## 2017-03-28 ENCOUNTER — Telehealth: Payer: Self-pay | Admitting: Family Medicine

## 2017-03-28 NOTE — Telephone Encounter (Signed)
Message has already been sent to Dr. Raliegh Scarlet.  Awaiting her response. Charyl Bigger, CMA

## 2017-03-28 NOTE — Telephone Encounter (Signed)
Pt called states spouse/Eric has same condition as her and wants to know if he can be prescribed (Gabapentin ) same as her--Please call them. --glh

## 2017-04-05 ENCOUNTER — Encounter: Payer: Self-pay | Admitting: Adult Health

## 2017-04-05 ENCOUNTER — Ambulatory Visit (INDEPENDENT_AMBULATORY_CARE_PROVIDER_SITE_OTHER): Payer: BLUE CROSS/BLUE SHIELD | Admitting: Adult Health

## 2017-04-05 DIAGNOSIS — L237 Allergic contact dermatitis due to plants, except food: Secondary | ICD-10-CM | POA: Insufficient documentation

## 2017-04-05 MED ORDER — PREDNISONE 10 MG (21) PO TBPK: ORAL_TABLET | ORAL | 0 refills | Status: DC

## 2017-04-05 NOTE — Assessment & Plan Note (Signed)
Please take Prednisone per [facility] instructions. OTC Calamine lotion per manufacturer's instructions. Avoid scratching sites. OTC Benadryl at bedtime per manufacturer's instructions. If symptoms do not resolve after Prednisone please call clinic.

## 2017-04-05 NOTE — Patient Instructions (Signed)
Poison Ivy Dermatitis Poison ivy dermatitis is inflammation of the skin that is caused by the allergens on the leaves of the poison ivy plant. The skin reaction often involves redness, swelling, blisters, and extreme itching. What are the causes? This condition is caused by a specific chemical (urushiol) found in the sap of the poison ivy plant. This chemical is sticky and can be easily spread to people, animals, and objects. You can get poison ivy dermatitis by:  Having direct contact with a poison ivy plant.  Touching animals, other people, or objects that have come in contact with poison ivy and have the chemical on them. What increases the risk? This condition is more likely to develop in:  People who are outdoors often.  People who go outdoors without wearing protective clothing, such as closed shoes, long pants, and a long-sleeved shirt. What are the signs or symptoms? Symptoms of this condition include:  Redness and itching.  A rash that often includes bumps and blisters. The rash usually appears 48 hours after exposure.  Swelling. This may occur if the reaction is more severe. Symptoms usually last for 1-2 weeks. However, the first time you develop this condition, symptoms may last 3-4 weeks. How is this diagnosed? This condition may be diagnosed based on your symptoms and a physical exam. Your health care provider may also ask you about any recent outdoor activity. How is this treated? Treatment for this condition will vary depending on how severe it is. Treatment may include:  Hydrocortisone creams or calamine lotions to relieve itching.  Oatmeal baths to soothe the skin.  Over-the-counter antihistamine tablets.  Oral steroid medicine for more severe outbreaks. Follow these instructions at home:  Take or apply over-the-counter and prescription medicines only as told by your health care provider.  Wash exposed skin as soon as possible with soap and cold water.  Use  hydrocortisone creams or calamine lotion as needed to soothe the skin and relieve itching.  Take oatmeal baths as needed. Use colloidal oatmeal. You can get this at your local pharmacy or grocery store. Follow the instructions on the packaging.  Do not scratch or rub your skin.  While you have the rash, wash clothes right after you wear them. How is this prevented?  Learn to identify the poison ivy plant and avoid contact with the plant. This plant can be recognized by the number of leaves. Generally, poison ivy has three leaves with flowering branches on a single stem. The leaves are typically glossy, and they have jagged edges that come to a point at the front.  If you have been exposed to poison ivy, thoroughly wash with soap and water right away. You have about 30 minutes to remove the plant resin before it will cause the rash. Be sure to wash under your fingernails because any plant resin there will continue to spread the rash.  When hiking or camping, wear clothes that will help you to avoid exposure on the skin. This includes long pants, a long-sleeved shirt, tall socks, and hiking boots. You can also apply preventive lotion to your skin to help limit exposure.  If you suspect that your clothes or outdoor gear came in contact with poison ivy, rinse them off outside with a garden hose before you bring them inside your house. Contact a health care provider if:  You have open sores in the rash area.  You have more redness, swelling, or pain in the affected area.  You have redness that spreads beyond  the rash area.  You have fluid, blood, or pus coming from the affected area.  You have a fever.  You have a rash over a large area of your body.  You have a rash on your eyes, mouth, or genitals.  Your rash does not improve after a few days. Get help right away if:  Your face swells or your eyes swell shut.  You have trouble breathing.  You have trouble swallowing. This  information is not intended to replace advice given to you by your health care provider. Make sure you discuss any questions you have with your health care provider. Document Released: 10/21/2000 Document Revised: 03/31/2016 Document Reviewed: 04/01/2015 Elsevier Interactive Patient Education  2017 [employer].  Please take Prednisone per [facility] instructions. OTC Calamine lotion per manufacturer's instructions. Avoid scratching sites. OTC Benadryl at bedtime per manufacturer's instructions. If symptoms do not resolve after Prednisone please call clinic.

## 2017-04-05 NOTE — Progress Notes (Signed)
Subjective:    Patient ID: Dawn Alexander, female    DOB: 28-Jul-1956, 61 y.o.   MRN: [id number]  HPI:  Ms. Fowle is here for poison ivy.  She was working in the yard last Tuesday/wed and she was wearing capri pants and a tank top.  She reports coming into contact with poison ivy while weeding and then rash developed on bil lower legs, bil arms, and forehead.  All areas itch, however she denies drainage or fever/night sweats. She has hx of poison ivy exposure with severe reaction.  Patient Care Team    Relationship Specialty Notifications Start End  [name], DO PCP - General Family Medicine  04/15/16   Salvadore Dom, MD Consulting Physician Obstetrics and Gynecology  03/16/17     Patient Active Problem List   Diagnosis Date Noted  . Contact dermatitis due to poison ivy 04/05/2017  . Chronic hip pain- R 03/16/2017  . Urinary incontinence, urge 03/16/2017  . Contusion of right hip 01/02/2017  . Greater trochanteric bursitis of right hip 09/23/2016  . Right lower extremity edema 07/01/2016  . Chronic foot pain 07/01/2016  . Venous (peripheral) insufficiency- B\L L Ext 07/01/2016  . Chronic pain in right foot 07/01/2016  . Increased vitamin B12 level due to excess supplements 05/17/2016  . Restless legs syndrome (RLS) 05/17/2016  . seasonal or environmental allergies 05/16/2016  . Insomnia 04/18/2016  . Vitamin D deficiency 04/18/2016  . Morbidly Obesity- 39.89 BMI 04/18/2016  . type II diabetes mellitus- diet controlled 04/18/2016  . GERD (gastroesophageal reflux disease) 04/18/2016  . Screening for STD (sexually transmitted disease) 04/18/2016  . HLD (hyperlipidemia) 04/15/2016  . Hypertension 04/15/2016  . hypothyroidism 04/15/2016  . Chronic pain 11/30/2011     Past Medical History:  Diagnosis Date  . Allergy   . Chronic lower back pain   . Fibroid   . Insomnia   . Restless leg syndrome   . Thyroid disease   . Vitamin D deficiency      Past  Surgical History:  Procedure Laterality Date  . ELBOW SURGERY Right 1999     Family History  Problem Relation Age of Onset  . Diabetes Mother   . COPD Mother   . Cancer Mother        lung  . Multiple sclerosis Father   . Heart disease Father   . Cancer Sister        lung  . Stroke Sister   . Cancer Brother        bladder  . Stroke Sister   . Leukemia Sister   . Alcohol abuse Brother   . Healthy Brother   . Healthy Daughter   . Leukemia Brother 5     History  Drug Use No     History  Alcohol Use  . 3.0 oz/week  . 5 Shots of liquor per week     History  Smoking Status  . Never Smoker  Smokeless Tobacco  . Never Used     Outpatient Encounter Prescriptions as of 04/05/2017  Medication Sig Note  . Acetaminophen-Caffeine (EXCEDRIN ASPIRIN FREE PO) Take by mouth daily as needed.   . cholecalciferol (VITAMIN D) 1000 units tablet Take 5,000 Units by mouth daily.   . Cyanocobalamin (B-12) 1000 MCG CAPS    . gabapentin (NEURONTIN) 300 MG capsule Take 600 mg by mouth 3 (three) times daily.   Marland Kitchen levothyroxine (SYNTHROID, LEVOTHROID) 100 MCG tablet Take 1 tablet (100 mcg total) by mouth daily.   Marland Kitchen  meloxicam (MOBIC) 15 MG tablet TAKE ONE TABLET BY MOUTH ONCE DAILY   . Multiple Vitamins-Minerals (B COMPLEX PLUS VITAMIN C PO) Take 1 tablet by mouth daily. 04/15/2016: Received from: [facility]: Take by mouth.  . ranitidine (ZANTAC) 150 MG capsule Take 1 capsule (150 mg total) by mouth daily.   Marland Kitchen rOPINIRole (REQUIP) 4 MG tablet Take 0.5 tablets (2 mg total) by mouth 2 (two) times daily.   Marland Kitchen tolterodine (DETROL LA) 2 MG 24 hr capsule Take 1 capsule (2 mg total) by mouth daily.   . predniSONE (STERAPRED UNI-PAK 21 TAB) 10 MG (21) TBPK tablet Per Pak instructions   . [DISCONTINUED] gabapentin (NEURONTIN) 300 MG capsule Take 1 capsule (300 mg total) by mouth 3 (three) times daily.   . [DISCONTINUED] Melatonin 5 MG TABS Take 1 tablet by mouth at bedtime.    No  facility-administered encounter medications on file as of 04/05/2017.     Allergies: Patient has no known allergies.  Body mass index is 40.46 kg/m.  Blood pressure 120/78, pulse 67, height 5' 3.25" (1.607 m), weight 230 lb 3.2 oz (104.4 kg).    Review of Systems  Constitutional: Negative for activity change, appetite change, chills, diaphoresis, fatigue, fever and unexpected weight change.  Respiratory: Negative for cough, choking, shortness of breath, wheezing and stridor.   Cardiovascular: Negative for chest pain, palpitations and leg swelling.  Gastrointestinal: Negative for diarrhea and nausea.  Skin: Positive for rash. Negative for color change, pallor and wound.  Allergic/Immunologic: Negative for immunocompromised state.  Hematological: Does not bruise/bleed easily.       Objective:   Physical Exam  Constitutional: She appears well-developed and well-nourished. No distress.  Skin: Skin is warm and dry. Rash noted. Rash is pustular and vesicular. She is not diaphoretic. There is erythema. No pallor.     Psychiatric: She has a normal mood and affect. Her behavior is normal. Judgment and thought content normal.  Nursing note and vitals reviewed.         Assessment & Plan:   1. Contact dermatitis due to poison ivy     Contact dermatitis due to poison ivy Please take Prednisone per [facility] instructions. OTC Calamine lotion per manufacturer's instructions. Avoid scratching sites. OTC Benadryl at bedtime per manufacturer's instructions. If symptoms do not resolve after Prednisone please call clinic.    FOLLOW-UP:  Return if symptoms worsen or fail to improve.

## 2017-04-06 ENCOUNTER — Telehealth: Payer: Self-pay | Admitting: Family Medicine

## 2017-04-06 MED ORDER — GABAPENTIN 300 MG PO CAPS: ORAL_CAPSULE | ORAL | 0 refills | Status: DC

## 2017-04-06 NOTE — Addendum Note (Signed)
Addended by: Amado Coe on: 04/06/2017 01:16 PM   Modules accepted: Orders

## 2017-04-06 NOTE — Telephone Encounter (Signed)
Patient is taking gabapentin 2 (300mg ) tablets 3 times a day.  Refill sent to [facility].  Patient aware.

## 2017-04-06 NOTE — Telephone Encounter (Signed)
Patient was told to increase her dose of gabapentin and is now almost out based on the last refill. She is requesting another one and would need it before next week as they are going out of town. She also would like for it to be sent to [facility] and not [facility].

## 2017-04-17 ENCOUNTER — Telehealth: Payer: Self-pay | Admitting: Family Medicine

## 2017-04-17 ENCOUNTER — Other Ambulatory Visit: Payer: Self-pay | Admitting: Adult Health

## 2017-04-17 MED ORDER — PREDNISONE 20 MG PO TABS: 20.0000 mg | ORAL_TABLET | Freq: Every day | ORAL | 0 refills | Status: DC

## 2017-04-17 NOTE — Telephone Encounter (Signed)
PT called states treated by [name] last week fr Poison Ivy , says [name] advised her to call back if Rx didn't clear rash. Pt request review & possible addt'l Rx.--Use [facility] . --pls contact pt with any questions. --glh

## 2017-04-17 NOTE — Telephone Encounter (Signed)
Good Morning Dawn Alexander, Can you please call [name] and ask her how much of the poison ivy remains after Pred Dose Pak? Where are areas of rash, ie. Arms, legs, etc? Please ask her if she complete the entire Dose Pak?  Depending on her answers, I may send I short Pred Rx-not another Dose pak-too much Pred.  Thanks! [name]

## 2017-04-17 NOTE — Telephone Encounter (Signed)
Pt informed.  Also advised pt that there is a soap that she should purchase and use immediately after coming in contact with poison oak/ivy.  Pt expressed understanding and is agreeable.  Charyl Bigger, CMA

## 2017-04-17 NOTE — Telephone Encounter (Signed)
[name], I sent in short pred rx-she needs to completley cover her skin when working outside.  Continuous courses of Pred is not recommended and we will not send anymore in for months (increase risk of osteoarthritis, etc). Please tell her to use Calamine lotion on areas of rash. Thanks! [name]

## 2017-04-17 NOTE — Telephone Encounter (Signed)
Pt states that the poison ivy is just on bilateral legs, front and back.  No other areas affect.  She states that she believes this is a new case of poison ivy d/t yard work.  She states that she did finish the dose pack previously prescribed.  Please advise.  Charyl Bigger, CMA

## 2017-04-25 ENCOUNTER — Encounter: Payer: BLUE CROSS/BLUE SHIELD | Admitting: Family Medicine

## 2017-05-17 ENCOUNTER — Ambulatory Visit (INDEPENDENT_AMBULATORY_CARE_PROVIDER_SITE_OTHER): Payer: BLUE CROSS/BLUE SHIELD | Admitting: Family Medicine

## 2017-05-17 ENCOUNTER — Encounter: Payer: Self-pay | Admitting: Family Medicine

## 2017-05-17 ENCOUNTER — Other Ambulatory Visit: Payer: Self-pay

## 2017-05-17 VITALS — BP 120/77 | HR 76 | Ht 63.25 in | Wt 229.9 lb

## 2017-05-17 DIAGNOSIS — G2581 Restless legs syndrome: Secondary | ICD-10-CM | POA: Diagnosis not present

## 2017-05-17 DIAGNOSIS — K219 Gastro-esophageal reflux disease without esophagitis: Secondary | ICD-10-CM

## 2017-05-17 DIAGNOSIS — Z6841 Body Mass Index (BMI) 40.0 and over, adult: Secondary | ICD-10-CM

## 2017-05-17 DIAGNOSIS — G894 Chronic pain syndrome: Secondary | ICD-10-CM | POA: Diagnosis not present

## 2017-05-17 DIAGNOSIS — Z719 Counseling, unspecified: Secondary | ICD-10-CM

## 2017-05-17 DIAGNOSIS — N3941 Urge incontinence: Secondary | ICD-10-CM

## 2017-05-17 DIAGNOSIS — Z0000 Encounter for general adult medical examination without abnormal findings: Secondary | ICD-10-CM

## 2017-05-17 DIAGNOSIS — F5102 Adjustment insomnia: Secondary | ICD-10-CM | POA: Diagnosis not present

## 2017-05-17 DIAGNOSIS — IMO0001 Reserved for inherently not codable concepts without codable children: Secondary | ICD-10-CM

## 2017-05-17 DIAGNOSIS — E559 Vitamin D deficiency, unspecified: Secondary | ICD-10-CM

## 2017-05-17 DIAGNOSIS — E079 Disorder of thyroid, unspecified: Secondary | ICD-10-CM | POA: Diagnosis not present

## 2017-05-17 DIAGNOSIS — I10 Essential (primary) hypertension: Secondary | ICD-10-CM | POA: Diagnosis not present

## 2017-05-17 DIAGNOSIS — Z8639 Personal history of other endocrine, nutritional and metabolic disease: Secondary | ICD-10-CM

## 2017-05-17 DIAGNOSIS — E669 Obesity, unspecified: Secondary | ICD-10-CM

## 2017-05-17 DIAGNOSIS — G8929 Other chronic pain: Secondary | ICD-10-CM

## 2017-05-17 DIAGNOSIS — E782 Mixed hyperlipidemia: Secondary | ICD-10-CM

## 2017-05-17 DIAGNOSIS — Z113 Encounter for screening for infections with a predominantly sexual mode of transmission: Secondary | ICD-10-CM

## 2017-05-17 DIAGNOSIS — B083 Erythema infectiosum [fifth disease]: Secondary | ICD-10-CM | POA: Insufficient documentation

## 2017-05-17 DIAGNOSIS — I872 Venous insufficiency (chronic) (peripheral): Secondary | ICD-10-CM

## 2017-05-17 MED ORDER — GABAPENTIN 300 MG PO CAPS: ORAL_CAPSULE | ORAL | 2 refills | Status: DC

## 2017-05-17 MED ORDER — ROPINIROLE HCL 4 MG PO TABS: ORAL_TABLET | ORAL | 2 refills | Status: DC

## 2017-05-17 MED ORDER — ROPINIROLE HCL 4 MG PO TABS: 4.0000 mg | ORAL_TABLET | Freq: Two times a day (BID) | ORAL | 2 refills | Status: DC

## 2017-05-17 MED ORDER — TOLTERODINE TARTRATE ER 2 MG PO CP24: 2.0000 mg | ORAL_CAPSULE | Freq: Every day | ORAL | 1 refills | Status: DC

## 2017-05-17 MED ORDER — LEVOTHYROXINE SODIUM 100 MCG PO TABS: 100.0000 ug | ORAL_TABLET | Freq: Every day | ORAL | 1 refills | Status: DC

## 2017-05-17 MED ORDER — MELOXICAM 15 MG PO TABS: 15.0000 mg | ORAL_TABLET | Freq: Every day | ORAL | 1 refills | Status: DC

## 2017-05-17 MED ORDER — RANITIDINE HCL 150 MG PO CAPS: 150.0000 mg | ORAL_CAPSULE | Freq: Every day | ORAL | 1 refills | Status: DC

## 2017-05-17 NOTE — Patient Instructions (Addendum)
- Please do the stool cards at home and bring back in the near future.  Melissa my CMA will teach you how to do that.   - Return in the near future for fasting blood work since you're not fasting today.    Preventive Care for Adults, Female  A healthy lifestyle and preventive care can promote health and wellness. Preventive health guidelines for women include the following key practices.   A routine yearly physical is a good way to check with your health care provider about your health and preventive screening. It is a chance to share any concerns and updates on your health and to receive a thorough exam.   Visit your dentist for a routine exam and preventive care every 6 months. Brush your teeth twice a day and floss once a day. Good oral hygiene prevents tooth decay and gum disease.   The frequency of eye exams is based on your age, health, family medical history, use of contact lenses, and other factors. Follow your health care provider's recommendations for frequency of eye exams.   Eat a healthy diet. Foods like vegetables, fruits, whole grains, low-fat dairy products, and lean protein foods contain the nutrients you need without too many calories. Decrease your intake of foods high in solid fats, added sugars, and salt. Eat the right amount of calories for you.Get information about a proper diet from your health care provider, if necessary.   Regular physical exercise is one of the most important things you can do for your health. Most adults should get at least 150 minutes of moderate-intensity exercise (any activity that increases your heart rate and causes you to sweat) each week. In addition, most adults need muscle-strengthening exercises on 2 or more days a week.   Maintain a healthy weight. The body mass index (BMI) is a screening tool to identify possible weight problems. It provides an estimate of body fat based on height and weight. Your health care provider can find your  BMI, and can help you achieve or maintain a healthy weight.For adults 20 years and older:   - A BMI below 18.5 is considered underweight.   - A BMI of 18.5 to 24.9 is normal.   - A BMI of 25 to 29.9 is considered overweight.   - A BMI of 30 and above is considered obese.   Maintain normal blood lipids and cholesterol levels by exercising and minimizing your intake of trans and saturated fats.  Eat a balanced diet with plenty of fruit and vegetables. Blood tests for lipids and cholesterol should begin at age 20 and be repeated every 5 years minimum.  If your lipid or cholesterol levels are high, you are over 40, or you are at high risk for heart disease, you may need your cholesterol levels checked more frequently.Ongoing high lipid and cholesterol levels should be treated with medicines if diet and exercise are not working.   If you smoke, find out from your health care provider how to quit. If you do not use tobacco, do not start.   Lung cancer screening is recommended for adults aged 41-80 years who are at high risk for developing lung cancer because of a history of smoking. A yearly low-dose CT scan of the lungs is recommended for people who have at least a 30-pack-year history of smoking and are a current smoker or have quit within the past 15 years. A pack year of smoking is smoking an average of 1 pack of cigarettes a  day for 1 year (for example: 1 pack a day for 30 years or 2 packs a day for 15 years). Yearly screening should continue until the smoker has stopped smoking for at least 15 years. Yearly screening should be stopped for people who develop a health problem that would prevent them from having lung cancer treatment.   If you are pregnant, do not drink alcohol. If you are breastfeeding, be very cautious about drinking alcohol. If you are not pregnant and choose to drink alcohol, do not have more than 1 drink per day. One drink is considered to be 12 ounces (355 mL) of beer, 5 ounces  (148 mL) of wine, or 1.5 ounces (44 mL) of liquor.   Avoid use of street drugs. Do not share needles with anyone. Ask for help if you need support or instructions about stopping the use of drugs.   High blood pressure causes heart disease and increases the risk of stroke. Your blood pressure should be checked at least yearly.  Ongoing high blood pressure should be treated with medicines if weight loss and exercise do not work.   If you are 79-57 years old, ask your health care provider if you should take aspirin to prevent strokes.   Diabetes screening involves taking a blood sample to check your fasting blood sugar level. This should be done once every 3 years, after age 47, if you are within normal weight and without risk factors for diabetes. Testing should be considered at a younger age or be carried out more frequently if you are overweight and have at least 1 risk factor for diabetes.   Breast cancer screening is essential preventive care for women. You should practice "breast self-awareness."  This means understanding the normal appearance and feel of your breasts and may include breast self-examination.  Any changes detected, no matter how small, should be reported to a health care provider.  Women in their 41s and 30s should have a clinical breast exam (CBE) by a health care provider as part of a regular health exam every 1 to 3 years.  After age 66, women should have a CBE every year.  Starting at age 32, women should consider having a mammogram (breast X-ray test) every year.  Women who have a family history of breast cancer should talk to their health care provider about genetic screening.  Women at a high risk of breast cancer should talk to their health care providers about having an MRI and a mammogram every year.   -Breast cancer gene (BRCA)-related cancer risk assessment is recommended for women who have family members with BRCA-related cancers. BRCA-related cancers include breast,  ovarian, tubal, and peritoneal cancers. Having family members with these cancers may be associated with an increased risk for harmful changes (mutations) in the breast cancer genes BRCA1 and BRCA2. Results of the assessment will determine the need for genetic counseling and BRCA1 and BRCA2 testing.   The Pap test is a screening test for cervical cancer. A Pap test can show cell changes on the cervix that might become cervical cancer if left untreated. A Pap test is a procedure in which cells are obtained and examined from the lower end of the uterus (cervix).   - Women should have a Pap test starting at age 46.   - Between ages 100 and 59, Pap tests should be repeated every 2 years.   - Beginning at age 44, you should have a Pap test every 3 years as long as  the past 3 Pap tests have been normal.   - Some women have medical problems that increase the chance of getting cervical cancer. Talk to your health care provider about these problems. It is especially important to talk to your health care provider if a new problem develops soon after your last Pap test. In these cases, your health care provider may recommend more frequent screening and Pap tests.   - The above recommendations are the same for women who have or have not gotten the vaccine for human papillomavirus (HPV).   - If you had a hysterectomy for a problem that was not cancer or a condition that could lead to cancer, then you no longer need Pap tests. Even if you no longer need a Pap test, a regular exam is a good idea to make sure no other problems are starting.   - If you are between ages 81 and 30 years, and you have had normal Pap tests going back 10 years, you no longer need Pap tests. Even if you no longer need a Pap test, a regular exam is a good idea to make sure no other problems are starting.   - If you have had past treatment for cervical cancer or a condition that could lead to cancer, you need Pap tests and screening for  cancer for at least 20 years after your treatment.   - If Pap tests have been discontinued, risk factors (such as a new sexual partner) need to be reassessed to determine if screening should be resumed.   - The HPV test is an additional test that may be used for cervical cancer screening. The HPV test looks for the virus that can cause the cell changes on the cervix. The cells collected during the Pap test can be tested for HPV. The HPV test could be used to screen women aged 71 years and older, and should be used in women of any age who have unclear Pap test results. After the age of 57, women should have HPV testing at the same frequency as a Pap test.   Colorectal cancer can be detected and often prevented. Most routine colorectal cancer screening begins at the age of 49 years and continues through age 45 years. However, your health care provider may recommend screening at an earlier age if you have risk factors for colon cancer. On a yearly basis, your health care provider may provide home test kits to check for hidden blood in the stool.  Use of a small camera at the end of a tube, to directly examine the colon (sigmoidoscopy or colonoscopy), can detect the earliest forms of colorectal cancer. Talk to your health care provider about this at age 42, when routine screening begins. Direct exam of the colon should be repeated every 5 -10 years through age 21 years, unless early forms of pre-cancerous polyps or small growths are found.   People who are at an increased risk for hepatitis B should be screened for this virus. You are considered at high risk for hepatitis B if:  -You were born in a country where hepatitis B occurs often. Talk with your health care provider about which countries are considered high risk.  - Your parents were born in a high-risk country and you have not received a shot to protect against hepatitis B (hepatitis B vaccine).  - You have HIV or AIDS.  - You use needles to  inject street drugs.  - You live with, or have sex  with, someone who has Hepatitis B.  - You get hemodialysis treatment.  - You take certain medicines for conditions like cancer, organ transplantation, and autoimmune conditions.   Hepatitis C blood testing is recommended for all people born from [birth year] through 1967 and any individual with known risks for hepatitis C.   Practice safe sex. Use condoms and avoid high-risk sexual practices to reduce the spread of sexually transmitted infections (STIs). STIs include gonorrhea, chlamydia, syphilis, trichomonas, herpes, HPV, and human immunodeficiency virus (HIV). Herpes, HIV, and HPV are viral illnesses that have no cure. They can result in disability, cancer, and death. Sexually active women aged 70 years and younger should be checked for chlamydia. Older women with new or multiple partners should also be tested for chlamydia. Testing for other STIs is recommended if you are sexually active and at increased risk.   Osteoporosis is a disease in which the bones lose minerals and strength with aging. This can result in serious bone fractures or breaks. The risk of osteoporosis can be identified using a bone density scan. Women ages 61 years and over and women at risk for fractures or osteoporosis should discuss screening with their health care providers. Ask your health care provider whether you should take a calcium supplement or vitamin D to There are also several preventive steps women can take to avoid osteoporosis and resulting fractures or to keep osteoporosis from worsening. -->Recommendations include:  Eat a balanced diet high in fruits, vegetables, calcium, and vitamins.  Get enough calcium. The recommended total intake of is 1,200 mg daily; for best absorption, if taking supplements, divide doses into 250-500 mg doses throughout the day. Of the two types of calcium, calcium carbonate is best absorbed when taken with food but calcium citrate can be  taken on an empty stomach.  Get enough vitamin D. NAMS and the [place] recommend at least 1,000 IU per day for women age 69 and over who are at risk of vitamin D deficiency. Vitamin D deficiency can be caused by inadequate sun exposure (for example, those who live in [place]).  Avoid alcohol and smoking. Heavy alcohol intake (more than 7 drinks per week) increases the risk of falls and hip fracture and women smokers tend to lose bone more rapidly and have lower bone mass than nonsmokers. Stopping smoking is one of the most important changes women can make to improve their health and decrease risk for disease.  Be physically active every day. Weight-bearing exercise (for example, fast walking, hiking, jogging, and weight training) may strengthen bones or slow the rate of bone loss that comes with aging. Balancing and muscle-strengthening exercises can reduce the risk of falling and fracture.  Consider therapeutic medications. Currently, several types of effective drugs are available. Healthcare providers can recommend the type most appropriate for each woman.  Eliminate environmental factors that may contribute to accidents. Falls cause nearly 90% of all osteoporotic fractures, so reducing this risk is an important bone-health strategy. Measures include ample lighting, removing obstructions to walking, using nonskid rugs on floors, and placing mats and/or grab bars in showers.  Be aware of medication side effects. Some common medicines make bones weaker. These include a type of steroid drug called glucocorticoids used for arthritis and asthma, some antiseizure drugs, certain sleeping pills, treatments for endometriosis, and some cancer drugs. An overactive thyroid gland or using too much thyroid hormone for an underactive thyroid can also be a problem. If you are taking these medicines, talk to your  doctor about what you can do to help protect your bones.reduce the rate  of osteoporosis.    Menopause can be associated with physical symptoms and risks. Hormone replacement therapy is available to decrease symptoms and risks. You should talk to your health care provider about whether hormone replacement therapy is right for you.   Use sunscreen. Apply sunscreen liberally and repeatedly throughout the day. You should seek shade when your shadow is shorter than you. Protect yourself by wearing long sleeves, pants, a wide-brimmed hat, and sunglasses year round, whenever you are outdoors.   Once a month, do a whole body skin exam, using a mirror to look at the skin on your back. Tell your health care provider of new moles, moles that have irregular borders, moles that are larger than a pencil eraser, or moles that have changed in shape or color.   -Stay current with required vaccines (immunizations).   Influenza vaccine. All adults should be immunized every year.  Tetanus, diphtheria, and acellular pertussis (Td, Tdap) vaccine. Pregnant women should receive 1 dose of Tdap vaccine during each pregnancy. The dose should be obtained regardless of the length of time since the last dose. Immunization is preferred during the 27th 36th week of gestation. An adult who has not previously received Tdap or who does not know her vaccine status should receive 1 dose of Tdap. This initial dose should be followed by tetanus and diphtheria toxoids (Td) booster doses every 10 years. Adults with an unknown or incomplete history of completing a 3-dose immunization series with Td-containing vaccines should begin or complete a primary immunization series including a Tdap dose. Adults should receive a Td booster every 10 years.  Varicella vaccine. An adult without evidence of immunity to varicella should receive 2 doses or a second dose if she has previously received 1 dose. Pregnant females who do not have evidence of immunity should receive the first dose after pregnancy. This first dose  should be obtained before leaving the health care facility. The second dose should be obtained 4 8 weeks after the first dose.  Human papillomavirus (HPV) vaccine. Females aged 52 26 years who have not received the vaccine previously should obtain the 3-dose series. The vaccine is not recommended for use in pregnant females. However, pregnancy testing is not needed before receiving a dose. If a female is found to be pregnant after receiving a dose, no treatment is needed. In that case, the remaining doses should be delayed until after the pregnancy. Immunization is recommended for any person with an immunocompromised condition through the age of 29 years if she did not get any or all doses earlier. During the 3-dose series, the second dose should be obtained 4 8 weeks after the first dose. The third dose should be obtained 24 weeks after the first dose and 16 weeks after the second dose.  Zoster vaccine. One dose is recommended for adults aged 60 years or older unless certain conditions are present.  Measles, mumps, and rubella (MMR) vaccine. Adults born before [birth year] generally are considered immune to measles and mumps. Adults born in [birth year] or later should have 1 or more doses of MMR vaccine unless there is a contraindication to the vaccine or there is laboratory evidence of immunity to each of the three diseases. A routine second dose of MMR vaccine should be obtained at least 28 days after the first dose for students attending postsecondary schools, health care workers, or international travelers. People who received inactivated measles vaccine or  an unknown type of measles vaccine during 1963 1967 should receive 2 doses of MMR vaccine. People who received inactivated mumps vaccine or an unknown type of mumps vaccine before 1979 and are at high risk for mumps infection should consider immunization with 2 doses of MMR vaccine. For females of childbearing age, rubella immunity should be determined. If there is  no evidence of immunity, females who are not pregnant should be vaccinated. If there is no evidence of immunity, females who are pregnant should delay immunization until after pregnancy. Unvaccinated health care workers born before [birth year] who lack laboratory evidence of measles, mumps, or rubella immunity or laboratory confirmation of disease should consider measles and mumps immunization with 2 doses of MMR vaccine or rubella immunization with 1 dose of MMR vaccine.  Pneumococcal 13-valent conjugate (PCV13) vaccine. When indicated, a person who is uncertain of her immunization history and has no record of immunization should receive the PCV13 vaccine. An adult aged 29 years or older who has certain medical conditions and has not been previously immunized should receive 1 dose of PCV13 vaccine. This PCV13 should be followed with a dose of pneumococcal polysaccharide (PPSV23) vaccine. The PPSV23 vaccine dose should be obtained at least 8 weeks after the dose of PCV13 vaccine. An adult aged 62 years or older who has certain medical conditions and previously received 1 or more doses of PPSV23 vaccine should receive 1 dose of PCV13. The PCV13 vaccine dose should be obtained 1 or more years after the last PPSV23 vaccine dose.  Pneumococcal polysaccharide (PPSV23) vaccine. When PCV13 is also indicated, PCV13 should be obtained first. All adults aged 3 years and older should be immunized. An adult younger than age 47 years who has certain medical conditions should be immunized. Any person who resides in a nursing home or long-term care facility should be immunized. An adult smoker should be immunized. People with an immunocompromised condition and certain other conditions should receive both PCV13 and PPSV23 vaccines. People with human immunodeficiency virus (HIV) infection should be immunized as soon as possible after diagnosis. Immunization during chemotherapy or radiation therapy should be avoided. Routine use of  PPSV23 vaccine is not recommended for American Indians, [place] Natives, or people younger than 65 years unless there are medical conditions that require PPSV23 vaccine. When indicated, people who have unknown immunization and have no record of immunization should receive PPSV23 vaccine. One-time revaccination 5 years after the first dose of PPSV23 is recommended for people aged 57 64 years who have chronic kidney failure, nephrotic syndrome, asplenia, or immunocompromised conditions. People who received 1 2 doses of PPSV23 before age 7 years should receive another dose of PPSV23 vaccine at age 40 years or later if at least 5 years have passed since the previous dose. Doses of PPSV23 are not needed for people immunized with PPSV23 at or after age 52 years.  Meningococcal vaccine. Adults with asplenia or persistent complement component deficiencies should receive 2 doses of quadrivalent meningococcal conjugate (MenACWY-D) vaccine. The doses should be obtained at least 2 months apart. Microbiologists working with certain meningococcal bacteria, [place] recruits, people at risk during an outbreak, and people who travel to or live in countries with a high rate of meningitis should be immunized. A first-year college student up through age 11 years who is living in a residence hall should receive a dose if she did not receive a dose on or after her 16th birthday. Adults who have certain high-risk conditions should receive one or more doses  of vaccine.  Hepatitis A vaccine. Adults who wish to be protected from this disease, have certain high-risk conditions, work with hepatitis A-infected animals, work in hepatitis A research labs, or travel to or work in countries with a high rate of hepatitis A should be immunized. Adults who were previously unvaccinated and who anticipate close contact with an international adoptee during the first 60 days after arrival in the [place] from a country with a high rate of  hepatitis A should be immunized.  Hepatitis B vaccine.  Adults who wish to be protected from this disease, have certain high-risk conditions, may be exposed to blood or other infectious body fluids, are household contacts or sex partners of hepatitis B positive people, are clients or workers in certain care facilities, or travel to or work in countries with a high rate of hepatitis B should be immunized.  Haemophilus influenzae type b (Hib) vaccine. A previously unvaccinated person with asplenia or sickle cell disease or having a scheduled splenectomy should receive 1 dose of Hib vaccine. Regardless of previous immunization, a recipient of a hematopoietic stem cell transplant should receive a 3-dose series 6 12 months after her successful transplant. Hib vaccine is not recommended for adults with HIV infection.  Preventive Services / Frequency Ages 12 to 39years  Blood pressure check.** / Every 1 to 2 years.  Lipid and cholesterol check.** / Every 5 years beginning at age 48.  Clinical breast exam.** / Every 3 years for women in their 52s and 69s.  BRCA-related cancer risk assessment.** / For women who have family members with a BRCA-related cancer (breast, ovarian, tubal, or peritoneal cancers).  Pap test.** / Every 2 years from ages 69 through 50. Every 3 years starting at age 60 through age 73 or 10 with a history of 3 consecutive normal Pap tests.  HPV screening.** / Every 3 years from ages 62 through ages 85 to 66 with a history of 3 consecutive normal Pap tests.  Hepatitis C blood test.** / For any individual with known risks for hepatitis C.  Skin self-exam. / Monthly.  Influenza vaccine. / Every year.  Tetanus, diphtheria, and acellular pertussis (Tdap, Td) vaccine.** / Consult your health care provider. Pregnant women should receive 1 dose of Tdap vaccine during each pregnancy. 1 dose of Td every 10 years.  Varicella vaccine.** / Consult your health care provider. Pregnant  females who do not have evidence of immunity should receive the first dose after pregnancy.  HPV vaccine. / 3 doses over 6 months, if 27 and younger. The vaccine is not recommended for use in pregnant females. However, pregnancy testing is not needed before receiving a dose.  Measles, mumps, rubella (MMR) vaccine.** / You need at least 1 dose of MMR if you were born in 1957 or later. You may also need a 2nd dose. For females of childbearing age, rubella immunity should be determined. If there is no evidence of immunity, females who are not pregnant should be vaccinated. If there is no evidence of immunity, females who are pregnant should delay immunization until after pregnancy.  Pneumococcal 13-valent conjugate (PCV13) vaccine.** / Consult your health care provider.  Pneumococcal polysaccharide (PPSV23) vaccine.** / 1 to 2 doses if you smoke cigarettes or if you have certain conditions.  Meningococcal vaccine.** / 1 dose if you are age 34 to 54 years and a Market researcher living in a residence hall, or have one of several medical conditions, you need to get vaccinated against meningococcal  disease. You may also need additional booster doses.  Hepatitis A vaccine.** / Consult your health care provider.  Hepatitis B vaccine.** / Consult your health care provider.  Haemophilus influenzae type b (Hib) vaccine.** / Consult your health care provider.  Ages 71 to 64years  Blood pressure check.** / Every 1 to 2 years.  Lipid and cholesterol check.** / Every 5 years beginning at age 84 years.  Lung cancer screening. / Every year if you are aged 11 80 years and have a 30-pack-year history of smoking and currently smoke or have quit within the past 15 years. Yearly screening is stopped once you have quit smoking for at least 15 years or develop a health problem that would prevent you from having lung cancer treatment.  Clinical breast exam.** / Every year after age 12  years.  BRCA-related cancer risk assessment.** / For women who have family members with a BRCA-related cancer (breast, ovarian, tubal, or peritoneal cancers).  Mammogram.** / Every year beginning at age 31 years and continuing for as long as you are in good health. Consult with your health care provider.  Pap test.** / Every 3 years starting at age 23 years through age 24 or 18 years with a history of 3 consecutive normal Pap tests.  HPV screening.** / Every 3 years from ages 36 years through ages 67 to 19 years with a history of 3 consecutive normal Pap tests.  Fecal occult blood test (FOBT) of stool. / Every year beginning at age 62 years and continuing until age 91 years. You may not need to do this test if you get a colonoscopy every 10 years.  Flexible sigmoidoscopy or colonoscopy.** / Every 5 years for a flexible sigmoidoscopy or every 10 years for a colonoscopy beginning at age 34 years and continuing until age 31 years.  Hepatitis C blood test.** / For all people born from [birth year] through 1967 and any individual with known risks for hepatitis C.  Skin self-exam. / Monthly.  Influenza vaccine. / Every year.  Tetanus, diphtheria, and acellular pertussis (Tdap/Td) vaccine.** / Consult your health care provider. Pregnant women should receive 1 dose of Tdap vaccine during each pregnancy. 1 dose of Td every 10 years.  Varicella vaccine.** / Consult your health care provider. Pregnant females who do not have evidence of immunity should receive the first dose after pregnancy.  Zoster vaccine.** / 1 dose for adults aged 52 years or older.  Measles, mumps, rubella (MMR) vaccine.** / You need at least 1 dose of MMR if you were born in 1957 or later. You may also need a 2nd dose. For females of childbearing age, rubella immunity should be determined. If there is no evidence of immunity, females who are not pregnant should be vaccinated. If there is no evidence of immunity, females who are  pregnant should delay immunization until after pregnancy.  Pneumococcal 13-valent conjugate (PCV13) vaccine.** / Consult your health care provider.  Pneumococcal polysaccharide (PPSV23) vaccine.** / 1 to 2 doses if you smoke cigarettes or if you have certain conditions.  Meningococcal vaccine.** / Consult your health care provider.  Hepatitis A vaccine.** / Consult your health care provider.  Hepatitis B vaccine.** / Consult your health care provider.  Haemophilus influenzae type b (Hib) vaccine.** / Consult your health care provider.  Ages 18 years and over  Blood pressure check.** / Every 1 to 2 years.  Lipid and cholesterol check.** / Every 5 years beginning at age 37 years.  Lung cancer screening. /  Every year if you are aged 46 80 years and have a 30-pack-year history of smoking and currently smoke or have quit within the past 15 years. Yearly screening is stopped once you have quit smoking for at least 15 years or develop a health problem that would prevent you from having lung cancer treatment.  Clinical breast exam.** / Every year after age 60 years.  BRCA-related cancer risk assessment.** / For women who have family members with a BRCA-related cancer (breast, ovarian, tubal, or peritoneal cancers).  Mammogram.** / Every year beginning at age 86 years and continuing for as long as you are in good health. Consult with your health care provider.  Pap test.** / Every 3 years starting at age 60 years through age 72 or 3 years with 3 consecutive normal Pap tests. Testing can be stopped between 65 and 70 years with 3 consecutive normal Pap tests and no abnormal Pap or HPV tests in the past 10 years.  HPV screening.** / Every 3 years from ages 65 years through ages 105 or 42 years with a history of 3 consecutive normal Pap tests. Testing can be stopped between 65 and 70 years with 3 consecutive normal Pap tests and no abnormal Pap or HPV tests in the past 10 years.  Fecal occult  blood test (FOBT) of stool. / Every year beginning at age 104 years and continuing until age 69 years. You may not need to do this test if you get a colonoscopy every 10 years.  Flexible sigmoidoscopy or colonoscopy.** / Every 5 years for a flexible sigmoidoscopy or every 10 years for a colonoscopy beginning at age 40 years and continuing until age 51 years.  Hepatitis C blood test.** / For all people born from [birth year] through 1965 and any individual with known risks for hepatitis C.  Osteoporosis screening.** / A one-time screening for women ages 4 years and over and women at risk for fractures or osteoporosis.  Skin self-exam. / Monthly.  Influenza vaccine. / Every year.  Tetanus, diphtheria, and acellular pertussis (Tdap/Td) vaccine.** / 1 dose of Td every 10 years.  Varicella vaccine.** / Consult your health care provider.  Zoster vaccine.** / 1 dose for adults aged 80 years or older.  Pneumococcal 13-valent conjugate (PCV13) vaccine.** / Consult your health care provider.  Pneumococcal polysaccharide (PPSV23) vaccine.** / 1 dose for all adults aged 24 years and older.  Meningococcal vaccine.** / Consult your health care provider.  Hepatitis A vaccine.** / Consult your health care provider.  Hepatitis B vaccine.** / Consult your health care provider.  Haemophilus influenzae type b (Hib) vaccine.** / Consult your health care provider. ** Family history and personal history of risk and conditions may change your health care provider's recommendations. Document Released: 12/20/2001 Document Revised: 08/14/2013  [facility] Patient Information 2014 [place], [place].   EXERCISE AND DIET:  We recommended that you start or continue a regular exercise program for good health. Regular exercise means any activity that makes your heart beat faster and makes you sweat.  We recommend exercising at least 30 minutes per day at least 3 days a week, preferably 5.  We also recommend a diet low in fat  and sugar / carbohydrates.  Inactivity, poor dietary choices and obesity can cause diabetes, heart attack, stroke, and kidney damage, among others.     ALCOHOL AND SMOKING:  Women should limit their alcohol intake to no more than 7 drinks/beers/glasses of wine (combined, not each!) per week. Moderation of alcohol intake to  this level decreases your risk of breast cancer and liver damage.  ( And of course, no recreational drugs are part of a healthy lifestyle.)  Also, you should not be smoking at all or even being exposed to second hand smoke. Most people know smoking can cause cancer, and various heart and lung diseases, but did you know it also contributes to weakening of your bones?  Aging of your skin?  Yellowing of your teeth and nails?   CALCIUM AND VITAMIN D:  Adequate intake of calcium and Vitamin D are recommended.  The recommendations for exact amounts of these supplements seem to change often, but generally speaking 600 mg of calcium (either carbonate or citrate) and 800 units of Vitamin D per day seems prudent. Certain women may benefit from higher intake of Vitamin D.  If you are among these women, your doctor will have told you during your visit.     PAP SMEARS:  Pap smears, to check for cervical cancer or precancers,  have traditionally been done yearly, although recent scientific advances have shown that most women can have pap smears less often.  However, every woman still should have a physical exam from her gynecologist or primary care physician every year. It will include a breast check, inspection of the vulva and vagina to check for abnormal growths or skin changes, a visual exam of the cervix, and then an exam to evaluate the size and shape of the uterus and ovaries.  And after 63 years of age, a rectal exam is indicated to check for rectal cancers. We will also provide age appropriate advice regarding health maintenance, like when you should have certain vaccines, screening for  sexually transmitted diseases, bone density testing, colonoscopy, mammograms, etc.    MAMMOGRAMS:  All women over 52 years old should have a yearly mammogram. Many facilities now offer a "3D" mammogram, which may cost around $50 extra out of pocket. If possible,  we recommend you accept the option to have the 3D mammogram performed.  It both reduces the number of women who will be called back for extra views which then turn out to be normal, and it is better than the routine mammogram at detecting truly abnormal areas.     COLONOSCOPY:  Colonoscopy to screen for colon cancer is recommended for all women at age 79.  We know, you hate the idea of the prep.  We agree, BUT, having colon cancer and not knowing it is worse!!  Colon cancer so often starts as a polyp that can be seen and removed at colonscopy, which can quite literally save your life!  And if your first colonoscopy is normal and you have no family history of colon cancer, most women don't have to have it again for 10 years.  Once every ten years, you can do something that may end up saving your life, right?  We will be happy to help you get it scheduled when you are ready.  Be sure to check your insurance coverage so you understand how much it will cost.  It may be covered as a preventative service at no cost, but you should check your particular policy.

## 2017-05-17 NOTE — Progress Notes (Signed)
Impression and Recommendations:    1. Encounter for wellness examination   2. Health education/counseling   3. Class 3 obesity with serious comorbidity and body mass index (BMI) of 40.0 to 44.9 in adult, unspecified obesity type ([employer])   4. Restless legs syndrome (RLS)   5. Urinary incontinence, urge   6. hypothyroidism   7. Gastroesophageal reflux disease, esophagitis presence not specified   8. type II diabetes mellitus- diet controlled   9. Other chronic pain   10. Essential hypertension   11. Chronic pain syndrome   12. Adjustment insomnia   13. Screening for STD (sexually transmitted disease)   39. Venous (peripheral) insufficiency- B\L L Ext   15. Vitamin D deficiency   16. Mixed hyperlipidemia     Please see orders section below for further details of actions taken during this office visit.  Gross side effects, risk and benefits, and alternatives of medications discussed with patient.  Patient is aware that all medications have potential side effects and we are unable to predict every side effect or drug-drug interaction that may occur.  Expresses verbal understanding and consents to current therapy plan and treatment regiment.  1) Anticipatory Guidance: Discussed importance of wearing a seatbelt while driving, not texting while driving; sunscreen when outside along with yearly skin surveillance; eating a well balanced and modest diet; physical activity at least 25 minutes per day or 150 min/ week of moderate to intense activity.  2) Immunizations / Screenings / Labs:  All immunizations and screenings that patient agrees to, are up-to-date per recommendations or will be updated today.  Patient understands the needs for q 26mo dental and yearly vision screens which pt will schedule independently. Obtain CBC, CMP, HgA1c, Lipid panel, TSH and vit D when fasting if not already done recently.   3) Weight:   Discussed goal of losing even 5-10% of current body weight which would  improve overall feelings of well being and improve objective health data significantly.   Improve nutrient density of diet through increasing intake of fruits and vegetables and decreasing saturated/trans fats, white flour products and refined sugar products.   F-up preventative CPE in 1 year. F/up sooner for chronic care management as discussed and/or prn.   Meds ordered this encounter  Medications  . Multiple Vitamin (MULTIVITAMIN) tablet    Sig: Take 1 tablet by mouth daily.  Marland Kitchen gabapentin (NEURONTIN) 300 MG capsule    Sig: Take 3 (300mg ) tablets three times a day.    Dispense:  270 capsule    Refill:  2  . levothyroxine (SYNTHROID, LEVOTHROID) 100 MCG tablet    Sig: Take 1 tablet (100 mcg total) by mouth daily.    Dispense:  90 tablet    Refill:  1  . meloxicam (MOBIC) 15 MG tablet    Sig: Take 1 tablet (15 mg total) by mouth daily.    Dispense:  90 tablet    Refill:  1  . ranitidine (ZANTAC) 150 MG capsule    Sig: Take 1 capsule (150 mg total) by mouth daily.    Dispense:  90 capsule    Refill:  1  . DISCONTD: rOPINIRole (REQUIP) 4 MG tablet    Sig: Take 1 tablet (4 mg total) by mouth 2 (two) times daily. Take 1 tablet in the morning and 2 tablets at night    Dispense:  90 tablet    Refill:  2  . tolterodine (DETROL LA) 2 MG 24 hr capsule  Sig: Take 1 capsule (2 mg total) by mouth daily.    Dispense:  90 capsule    Refill:  1  . rOPINIRole (REQUIP) 4 MG tablet    Sig: Take 1 tablet in the morning and 2 tablets at night    Dispense:  90 tablet    Refill:  2     Discontinued Medications   CHOLECALCIFEROL (VITAMIN D) 1000 UNITS TABLET    Take 5,000 Units by mouth daily.   CYANOCOBALAMIN (B-12) 1000 MCG CAPS       MULTIPLE VITAMINS-MINERALS (B COMPLEX PLUS VITAMIN C PO)    Take 1 tablet by mouth daily.   PREDNISONE (DELTASONE) 20 MG TABLET    Take 1 tablet (20 mg total) by mouth daily with breakfast. 3 tabs day one.  2 tabs days 2-3, 1 tab days 4-5.      Please see  orders placed and AVS handed out to patient at the end of our visit for further patient instructions/ counseling done pertaining to today's office visit.     Subjective:    Chief Complaint  Patient presents with  . Annual Exam    HPI: Dawn Alexander is a 61 y.o. female who presents to [facility] today a yearly health maintenance exam.  Patient got a yearly eye exam at [facility] through the optometrist there.  She gets it done yearly and is up-to-date.  Up-to-date on her colonoscopy which was done in [place] around age 25 and was normal, no polyps and was told to repeat in 10 years.  She will get [place] those records.  Mammogram is up-to-date was done in april 2018 and was negative.  She sees Dr. Sumner Boast for her GYN care.  Dentist-  Dr Braxton Feathers 6-12 mo   Health Maintenance Summary Reviewed and updated, unless pt declines services.  Aspirin: advised 81 mg daily Colonoscopy:    UTD per pt report- asked to sign Forms for [place] to get those records.  Patient doesn't recall name of GI doctor in [place]  Tdap: Up to date: needs TD  Zostavax:    Postponed per pt Tobacco History Reviewed:   Y  CT scan for screening lung CA:   n/a Alcohol:    No concerns, no excessive use Exercise Habits:   Not meeting AHA guidelines Drug Use:   None  Per GYN:  STD concerns:   none Birth control method:   n/a Menses regular:     N/a- post Menop Lumps or breast concerns:      no Breast Cancer Family History:      No Bone/ DEXA scan:    Per GYN   Health Maintenance  Topic Date Due  . Hepatitis C Screening  05-07-1956  . HIV Screening  08/25/1969  . INFLUENZA VACCINE  06/07/2017  . COLONOSCOPY  11/07/2018  . PAP SMEAR  02/05/2019  . MAMMOGRAM  03/04/2019  . TETANUS/TDAP  03/12/2024     Wt Readings from Last 3 Encounters:  05/17/17 229 lb 14.4 oz (104.3 kg)  04/05/17 230 lb 3.2 oz (104.4 kg)  03/16/17 229 lb 11.2 oz (104.2 kg)   BP Readings from Last 3 Encounters:    05/17/17 120/77  04/05/17 120/78  03/16/17 120/80   Pulse Readings from Last 3 Encounters:  05/17/17 76  04/05/17 67  03/16/17 75     Past Medical History:  Diagnosis Date  . Allergy   . Chronic lower back pain   . Fibroid   .  Insomnia   . Restless leg syndrome   . Thyroid disease   . Vitamin D deficiency       Past Surgical History:  Procedure Laterality Date  . ELBOW SURGERY Right 1999      Family History  Problem Relation Age of Onset  . Diabetes Mother   . COPD Mother   . Cancer Mother        lung  . Multiple sclerosis Father   . Heart disease Father   . Cancer Sister        lung  . Stroke Sister   . Cancer Brother        bladder  . Stroke Sister   . Leukemia Sister   . Alcohol abuse Brother   . Healthy Brother   . Healthy Daughter   . Leukemia Brother 5      History  Drug Use No  ,   History  Alcohol Use  . 3.0 oz/week  . 5 Shots of liquor per week  ,   History  Smoking Status  . Never Smoker  Smokeless Tobacco  . Never Used  ,   History  Sexual Activity  . Sexual activity: Yes  . Partners: Male  . Birth control/ protection: Post-menopausal    Current Outpatient Prescriptions on File Prior to Visit  Medication Sig Dispense Refill  . Acetaminophen-Caffeine (EXCEDRIN ASPIRIN FREE PO) Take by mouth daily as needed.     No current facility-administered medications on file prior to visit.     Allergies: Patient has no known allergies.  Review of Systems: General:   Denies fever, chills, unexplained weight loss.  Optho/Auditory:   Denies visual changes, blurred vision/LOV Respiratory:   Denies SOB, DOE more than baseline levels.  Cardiovascular:   Denies chest pain, palpitations, new onset peripheral edema  Gastrointestinal:   Denies nausea, vomiting, diarrhea.  Genitourinary: Denies dysuria, freq/ urgency, flank pain or discharge from genitals.  Endocrine:     Denies hot or cold intolerance, polyuria,  polydipsia. Musculoskeletal:   Denies unexplained myalgias, joint swelling, unexplained arthralgias, gait problems.  Skin:  Denies rash, suspicious lesions Neurological:     Denies dizziness, unexplained weakness, numbness  Psychiatric/Behavioral:   Denies mood changes, suicidal or homicidal ideations, hallucinations    Objective:    Blood pressure 120/77, pulse 76, height 5' 3.25" (1.607 m), weight 229 lb 14.4 oz (104.3 kg). Body mass index is 40.4 kg/m. General Appearance:    Alert, cooperative, no distress, appears stated age  Head:    Normocephalic, without obvious abnormality, atraumatic  Eyes:    PERRL, conjunctiva/corneas clear, EOM's intact, fundi    benign, both eyes  Ears:    Normal TM's and external ear canals, both ears  Nose:   Nares normal, septum midline, mucosa normal, no drainage    or sinus tenderness  Throat:   Lips w/o lesion, mucosa moist, and tongue normal; teeth and   gums normal  Neck:   Supple, symmetrical, trachea midline, no adenopathy;    thyroid:  no enlargement/tenderness/nodules; no carotid   bruit or JVD  Back:     Symmetric, no obvious curvature, ROM normal, no CVA tenderness  Lungs:     Clear to auscultation bilaterally, respirations unlabored, no       Wh/ R/ R  Chest Wall:    No tenderness or gross deformity; normal excursion   Heart:    Regular rate and rhythm, S1 and S2 normal, no murmur, rub   or gallop  Breast Exam:    Deferred by pt-  Has GYN  Abdomen:     Obese, Soft, non-tender, bowel sounds active all four quadrants, NO   G/R/R, no masses, no organomegaly  Genitalia:     Deferred by pt-  Has GYN   Rectal:    Deferred by pt  Extremities:   Extremities normal, atraumatic, no cyanosis   Pulses:   2+ and symmetric all extremities  Skin:   Warm, dry, Skin color, texture, turgor normal, no obvious rashes or lesions Psych: No HI/SI, judgement and insight good, Euthymic mood. Full Affect.  Neurologic:   CNII-XII intact, normal strength,  sensation and reflexes    Throughout

## 2017-05-18 ENCOUNTER — Other Ambulatory Visit (INDEPENDENT_AMBULATORY_CARE_PROVIDER_SITE_OTHER): Payer: BLUE CROSS/BLUE SHIELD

## 2017-05-18 DIAGNOSIS — R748 Abnormal levels of other serum enzymes: Secondary | ICD-10-CM

## 2017-05-18 DIAGNOSIS — I10 Essential (primary) hypertension: Secondary | ICD-10-CM | POA: Diagnosis not present

## 2017-05-18 DIAGNOSIS — E079 Disorder of thyroid, unspecified: Secondary | ICD-10-CM

## 2017-05-18 DIAGNOSIS — Z8639 Personal history of other endocrine, nutritional and metabolic disease: Secondary | ICD-10-CM

## 2017-05-18 DIAGNOSIS — Z6841 Body Mass Index (BMI) 40.0 and over, adult: Secondary | ICD-10-CM

## 2017-05-18 DIAGNOSIS — IMO0001 Reserved for inherently not codable concepts without codable children: Secondary | ICD-10-CM

## 2017-05-18 DIAGNOSIS — E669 Obesity, unspecified: Secondary | ICD-10-CM

## 2017-05-18 DIAGNOSIS — E785 Hyperlipidemia, unspecified: Secondary | ICD-10-CM

## 2017-05-18 DIAGNOSIS — E559 Vitamin D deficiency, unspecified: Secondary | ICD-10-CM

## 2017-05-19 ENCOUNTER — Telehealth: Payer: Self-pay | Admitting: Family Medicine

## 2017-05-19 LAB — COMPREHENSIVE METABOLIC PANEL
ALT: 23 IU/L (ref 0–32)
AST: 21 IU/L (ref 0–40)
Albumin/Globulin Ratio: 1.6 (ref 1.2–2.2)
Albumin: 4.2 g/dL (ref 3.6–4.8)
Alkaline Phosphatase: 82 IU/L (ref 39–117)
BILIRUBIN TOTAL: 0.4 mg/dL (ref 0.0–1.2)
BUN/Creatinine Ratio: 39 — ABNORMAL HIGH (ref 12–28)
BUN: 31 mg/dL — AB (ref 8–27)
CALCIUM: 9.4 mg/dL (ref 8.7–10.3)
CHLORIDE: 102 mmol/L (ref 96–106)
CO2: 25 mmol/L (ref 20–29)
CREATININE: 0.79 mg/dL (ref 0.57–1.00)
GFR, EST AFRICAN AMERICAN: 93 mL/min/{1.73_m2} (ref >59)
GFR, EST NON AFRICAN AMERICAN: 80 mL/min/{1.73_m2} (ref >59)
GLUCOSE: 98 mg/dL (ref 65–99)
Globulin, Total: 2.7 g/dL (ref 1.5–4.5)
Potassium: 4.9 mmol/L (ref 3.5–5.2)
Sodium: 143 mmol/L (ref 134–144)
TOTAL PROTEIN: 6.9 g/dL (ref 6.0–8.5)

## 2017-05-19 LAB — CBC WITH DIFFERENTIAL/PLATELET
BASOS ABS: 0 10*3/uL (ref 0.0–0.2)
Basos: 0 %
EOS (ABSOLUTE): 0.3 10*3/uL (ref 0.0–0.4)
Eos: 7 %
Hematocrit: 41.8 % (ref 34.0–46.6)
Hemoglobin: 13.2 g/dL (ref 11.1–15.9)
IMMATURE GRANS (ABS): 0 10*3/uL (ref 0.0–0.1)
IMMATURE GRANULOCYTES: 0 %
LYMPHS: 28 %
Lymphocytes Absolute: 1.3 10*3/uL (ref 0.7–3.1)
MCH: 26.1 pg — ABNORMAL LOW (ref 26.6–33.0)
MCHC: 31.6 g/dL (ref 31.5–35.7)
MCV: 83 fL (ref 79–97)
MONOCYTES: 10 %
Monocytes Absolute: 0.5 10*3/uL (ref 0.1–0.9)
NEUTROS PCT: 55 %
Neutrophils Absolute: 2.6 10*3/uL (ref 1.4–7.0)
PLATELETS: 269 10*3/uL (ref 150–379)
RBC: 5.06 x10E6/uL (ref 3.77–5.28)
RDW: 14 % (ref 12.3–15.4)
WBC: 4.8 10*3/uL (ref 3.4–10.8)

## 2017-05-19 LAB — LIPID PANEL
CHOL/HDL RATIO: 3.5 ratio (ref 0.0–4.4)
CHOLESTEROL TOTAL: 188 mg/dL (ref 100–199)
HDL: 53 mg/dL (ref >39)
LDL Calculated: 107 mg/dL — ABNORMAL HIGH (ref 0–99)
TRIGLYCERIDES: 138 mg/dL (ref 0–149)
VLDL Cholesterol Cal: 28 mg/dL (ref 5–40)

## 2017-05-19 LAB — TSH: TSH: 3.76 u[IU]/mL (ref 0.450–4.500)

## 2017-05-19 LAB — VITAMIN D 25 HYDROXY (VIT D DEFICIENCY, FRACTURES): VIT D 25 HYDROXY: 36.3 ng/mL (ref 30.0–100.0)

## 2017-05-19 LAB — HEMOGLOBIN A1C
Est. average glucose Bld gHb Est-mCnc: 126 mg/dL
Hgb A1c MFr Bld: 6 % — ABNORMAL HIGH (ref 4.8–5.6)

## 2017-05-19 LAB — VITAMIN B12: Vitamin B-12: 822 pg/mL (ref 232–1245)

## 2017-05-19 NOTE — Telephone Encounter (Signed)
Pt called states [facility] at [facility] sent a request to Doctor to clarify dosage of the roPINirole/ Requip 4 MG--pls call them if possible 1st thing Monday 716--Pt is out of meds completely. --glh

## 2017-05-22 NOTE — Telephone Encounter (Signed)
Called [facility] and clarified directions - called and notified patient.  MPulliam, CMA/RT(R)

## 2017-06-13 ENCOUNTER — Telehealth: Payer: Self-pay | Admitting: Family Medicine

## 2017-06-13 NOTE — Telephone Encounter (Signed)
Patient is going out of town on Thurday and called to her pharmacy to get a refill of her gabapentin, ropinirole, and tolterodine. She was told by the pharmacy they would have to send a refill request to [place] (I haven't seen it come through yet). But I did tell the patient it looks like she has additional refills on all these meds from when they were last ordered on 05/17/17. Please advise.

## 2017-06-13 NOTE — Telephone Encounter (Signed)
Called the patient and notified her that we have not received any request from the pharmacy and that all of her medication should have refills.  Patient will call me back if she needs anything.  MPulliam, CMA/RT(R)

## 2017-06-23 ENCOUNTER — Telehealth: Payer: Self-pay | Admitting: Family Medicine

## 2017-06-23 NOTE — Telephone Encounter (Signed)
Called [facility] where the patient's medication was sent to.  Per the pharmacy the levothyroxine that the patient picked up from them is not the one recalled, different manufacturer.  Called patient left message to notify. MPulliam, CMA/RT(R)

## 2017-06-23 NOTE — Telephone Encounter (Signed)
Patient called stating that she heard there was a recall on her thyroid meds and was wondering if she should continue taking them. They are currently on vacation in [place]. Please advise.

## 2017-07-06 ENCOUNTER — Telehealth: Payer: Self-pay | Admitting: Family Medicine

## 2017-07-06 NOTE — Telephone Encounter (Signed)
Patient left VM asking for a dermatology referral for an unspecified reason, please advise if we can do this for her

## 2017-07-07 NOTE — Telephone Encounter (Signed)
Per Dr. Raliegh Scarlet patient can call and make appointment without a referral and it may be quicker.  Called the patient and notified and gave her the following names for derm based on Dr Hershal Coria recommendation: Dr Wilhemina Bonito, Dr. Renda Rolls, and Dr Lavonna Monarch.  MPulliam, CMA/RT(R)

## 2017-07-07 NOTE — Telephone Encounter (Signed)
Called patient left message for patient to call the office. MPulliam, CMA/RT(R)  

## 2017-07-15 DIAGNOSIS — D259 Leiomyoma of uterus, unspecified: Secondary | ICD-10-CM | POA: Insufficient documentation

## 2017-07-15 DIAGNOSIS — M5136 Other intervertebral disc degeneration, lumbar region: Secondary | ICD-10-CM | POA: Insufficient documentation

## 2017-07-23 ENCOUNTER — Other Ambulatory Visit: Payer: Self-pay | Admitting: Family Medicine

## 2017-07-23 DIAGNOSIS — G2581 Restless legs syndrome: Secondary | ICD-10-CM

## 2017-07-25 ENCOUNTER — Telehealth: Payer: Self-pay | Admitting: Family Medicine

## 2017-07-25 ENCOUNTER — Other Ambulatory Visit: Payer: Self-pay

## 2017-07-25 DIAGNOSIS — G2581 Restless legs syndrome: Secondary | ICD-10-CM

## 2017-07-25 MED ORDER — ROPINIROLE HCL 4 MG PO TABS: ORAL_TABLET | ORAL | 2 refills | Status: DC

## 2017-07-25 NOTE — Telephone Encounter (Signed)
Patient had a prescription sent to [place] for her Requip, she never picked it up and now can't get it transferred back here to [facility] per [employer]. She needs a new prescription sent to [facility] on [address]

## 2017-07-25 NOTE — Telephone Encounter (Signed)
Patient states that she had medication transferred to [facility] in [place] and was unable to pick up the medication before returning to [facility].  Patient states that the Pharmacy could not transfer it back due to [place] regulations. Called the [facility] in [place] and verified that the medication was not picked up and that could not be transferred back to local [facility]. I told them to cancel out that script and resent in script to [facility] on [address].   MPulliam, CMA/RT(R)

## 2017-07-25 NOTE — Telephone Encounter (Signed)
Resent and notified patient.  MPulliam, CMA/RT(R)

## 2017-07-25 NOTE — Telephone Encounter (Signed)
Called patient left message for patient to call the office back. MPulliam, CMA/RT(R)

## 2017-08-01 ENCOUNTER — Telehealth: Payer: Self-pay | Admitting: Obstetrics and Gynecology

## 2017-08-01 NOTE — Telephone Encounter (Signed)
Patient canceled her upcoming appointment 08/03/17 for  6 months  f/u exam, fibroid uterus. Patient states she will call later to reschedule.

## 2017-08-03 ENCOUNTER — Ambulatory Visit: Payer: BLUE CROSS/BLUE SHIELD | Admitting: Obstetrics and Gynecology

## 2017-08-21 NOTE — Telephone Encounter (Signed)
Second attempted to reschedule this appointment. Patient said she was not prepared to reschedule at this time. Patient said she would call when she is ready to reschedule. Okay to close encounter?

## 2017-08-22 NOTE — Telephone Encounter (Signed)
The patient understands the reasoning for the f/u. Will close the encounter.

## 2017-11-09 ENCOUNTER — Ambulatory Visit (INDEPENDENT_AMBULATORY_CARE_PROVIDER_SITE_OTHER): Payer: Self-pay | Admitting: Family Medicine

## 2017-11-09 ENCOUNTER — Encounter: Payer: Self-pay | Admitting: Family Medicine

## 2017-11-09 VITALS — BP 138/85 | HR 75 | Temp 98.5°F | Ht 63.25 in | Wt 233.0 lb

## 2017-11-09 DIAGNOSIS — L0390 Cellulitis, unspecified: Secondary | ICD-10-CM

## 2017-11-09 MED ORDER — CEPHALEXIN 500 MG PO CAPS: 500.0000 mg | ORAL_CAPSULE | Freq: Three times a day (TID) | ORAL | 0 refills | Status: DC

## 2017-11-09 NOTE — Progress Notes (Signed)
Pt here for an acute care OV today   Impression and Recommendations:    1. Cellulitis, unspecified cellulitis site     1. Medications given as listed below. Recommended warm compress with warm water and dial soap. Soak the affected area 15-2min.   Elevate your  leg above your heart for as long and often as possible.     Follow-up in 1 week, or if symptoms worsens, come into the office immediately.  2. Please use Dial soap soaks with warm water and put a warm washcloth over area while you have it elevated above the level of your heart.   3. for as long and often as you can during the day, please keep it elevated. 4. Continue to drink adequate amounts of water and take all antibiotics even if clearing up within a couple of days. 5. As discussed this should continue to slowly improve over time and not worsen.  If in a couple of days by Monday if it is worse or even over the weekend, if we are not here please go to an urgent care.  Pain and swelling as well as redness if you follow above instructions should not worsen with time with treatment and if it does then would you would need emergent further evaluation   Meds ordered this encounter  Medications  . cephALEXin (KEFLEX) 500 MG capsule    Sig: Take 1 capsule (500 mg total) by mouth 3 (three) times daily.    Dispense:  30 capsule    Refill:  0     Education and routine counseling performed. Handouts provided  Gross side effects, risk and benefits, and alternatives of medications and treatment plan in general discussed with patient.  Patient is aware that all medications have potential side effects and we are unable to predict every side effect or drug-drug interaction that may occur.   Patient will call with any questions prior to using medication if they have concerns.  Expresses verbal understanding and consents to current therapy and treatment regimen.  No barriers to understanding were identified.  Red flag symptoms and signs  discussed in detail.  Patient expressed understanding regarding what to do in case of emergency\urgent symptoms  Meds ordered this encounter  Medications  . cephALEXin (KEFLEX) 500 MG capsule    Sig: Take 1 capsule (500 mg total) by mouth 3 (three) times daily.    Dispense:  30 capsule    Refill:  0    Please see AVS handed out to patient at the end of our visit for further patient instructions/ counseling done pertaining to today's office visit.   Return if symptoms worsen or fail to improve over next couple days- left [place] know!, for cellulitis- f/up mid week next week. .     Note: This document was prepared using Dragon voice recognition software and may include unintentional dictation errors.  This document serves as a record of services personally performed by [name], DO. It was created on her behalf by Mayer Masker, a trained medical scribe. The creation of this record is based on the scribe's personal observations and the provider's statements to them.   I have reviewed medical documentation per the transcriptionist for accuracy and concur.  [name] 64/05/19 4:06 PM   --------------------------------------------------------------------------------------------------------------------------------------------------------------------------------------------------------------------------------------------    Subjective:    CC: Right leg pain Chief Complaint  Patient presents with  . Leg Swelling    right lower leg swelling, pain, and heat x 2 weeks  HPI: Dawn Alexander is a 62 y.o. female who presents to [place] at [facility] today for issues as discussed below.   Right leg:  Pt has had this issue for two-three weeks.  No insult she knows of- no evidence of bump or bite mark, but she states it looked like a small indention at first.   (She has had chronic pitting peripheral edema b/l)   She states she woke up one am about 2-3 wks ago  and it just felt sore.   - She went to [facility] 1.5 weeks ago when sx started but was not prescribed Abx at the time.  She was told it was likely a skin infection but not bad enough for meds.  She was told to use warm compresses and to return if W.    She did neither.   She has not been elevating it.      -  Her symptoms of redness began gradually and have slowly worsened over the past 2-3 wks.   Worsened in redness and pain this morning- called for appt.   - She tried using neosporin with no relief.   - She denies any insect bite, long-distance traveling, or trauma or injury to the area. She denies N/V/D/ill feelings o/w    Wt Readings from Last 3 Encounters:  11/09/17 233 lb (105.7 kg)  05/17/17 229 lb 14.4 oz (104.3 kg)  04/05/17 230 lb 3.2 oz (104.4 kg)   BP Readings from Last 3 Encounters:  11/09/17 138/85  05/17/17 120/77  04/05/17 120/78   BMI Readings from Last 3 Encounters:  11/09/17 40.95 kg/m  05/17/17 40.40 kg/m  04/05/17 40.46 kg/m     Patient Care Team    Relationship Specialty Notifications Start End  [name], DO PCP - General Family Medicine  04/15/16   Salvadore Dom, MD Consulting Physician Obstetrics and Gynecology  03/16/17      Patient Active Problem List   Diagnosis Date Noted  . Class 3 obesity with serious comorbidity and body mass index (BMI) of 40.0 to 44.9 in adult 04/18/2016    Priority: High  . type II diabetes mellitus- diet controlled 04/18/2016    Priority: High  . h/o HLD (hyperlipidemia) 04/15/2016    Priority: High  . Hypertension 04/15/2016    Priority: High  . hypothyroidism 04/15/2016    Priority: High  . Uterine fibroid- noted on CT scan from 3\21\18 07/15/2017    Priority: Medium  . Degenerative disc disease, lumbar- noted on CT 01-25-17 07/15/2017    Priority: Medium  . Venous (peripheral) insufficiency- B\L L Ext 07/01/2016    Priority: Medium  . Restless legs syndrome (RLS) 05/17/2016    Priority: Medium  .  Insomnia 04/18/2016    Priority: Medium  . GERD (gastroesophageal reflux disease) 04/18/2016    Priority: Medium  . Urinary incontinence, urge 03/16/2017    Priority: Low  . Greater trochanteric bursitis of right hip 09/23/2016    Priority: Low  . Vitamin D deficiency 04/18/2016    Priority: Low  . Chronic hip pain- R 03/16/2017  . Diverticulosis of sigmoid colon 03/16/2017  . Contusion of right hip- fell 12/22/16 01/02/2017  . Right lower extremity edema 07/01/2016  . Chronic foot pain 07/01/2016  . Chronic pain in right foot 07/01/2016  . Increased vitamin B12 level due to excess supplements 05/17/2016  . seasonal or environmental allergies 05/16/2016  . Chronic pain 11/30/2011    Past Medical history, Surgical  history, Family history, Social history, Allergies and Medications have been entered into the medical record, reviewed and changed as needed.    Current Meds  Medication Sig  . Acetaminophen-Caffeine (EXCEDRIN ASPIRIN FREE PO) Take by mouth daily as needed.  Marland Kitchen levothyroxine (SYNTHROID, LEVOTHROID) 100 MCG tablet Take 1 tablet (100 mcg total) by mouth daily.  . Multiple Vitamin (MULTIVITAMIN) tablet Take 1 tablet by mouth daily.  . [DISCONTINUED] gabapentin (NEURONTIN) 300 MG capsule Take 3 (300mg ) tablets three times a day.  . [DISCONTINUED] meloxicam (MOBIC) 15 MG tablet Take 1 tablet (15 mg total) by mouth daily.  . [DISCONTINUED] ranitidine (ZANTAC) 150 MG capsule Take 1 capsule (150 mg total) by mouth daily.  . [DISCONTINUED] rOPINIRole (REQUIP) 4 MG tablet Take 1 tablet in the morning and 2 tablets at night  . [DISCONTINUED] tolterodine (DETROL LA) 2 MG 24 hr capsule Take 1 capsule (2 mg total) by mouth daily.    Allergies:  No Known Allergies   Review of Systems: General:   Denies fever, chills, unexplained weight loss.  Optho/Auditory:   Denies visual changes, blurred vision/LOV Respiratory:   Denies wheeze, DOE more than baseline levels.  Cardiovascular:    Denies chest pain, palpitations, new onset peripheral edema  Gastrointestinal:   Denies nausea, vomiting, diarrhea, abd pain.  Genitourinary: Denies dysuria, freq/ urgency, flank pain or discharge from genitals.  Endocrine:     Denies hot or cold intolerance, polyuria, polydipsia. Musculoskeletal:   Denies unexplained myalgias, joint swelling, unexplained arthralgias, gait problems.  Skin:  Denies new onset rash, suspicious lesions Neurological:     Denies dizziness, unexplained weakness, numbness  Psychiatric/Behavioral:   Denies mood changes, suicidal or homicidal ideations, hallucinations    Objective:   Blood pressure 138/85, pulse 75, temperature 98.5 F (36.9 C), height 5' 3.25" (1.607 m), weight 233 lb (105.7 kg), SpO2 99 %. Body mass index is 40.95 kg/m. General:  Well Developed, well nourished, appropriate for stated age.  Neuro:  Alert and oriented,  extra-ocular muscles intact  HEENT:  Normocephalic, atraumatic, neck supple Skin:  Large Erythematous, indurated area of right anterior lower extremity approximately 5-6 cm diameter with increased peripheral swelling distal to the cellulitis No calf tenderness, no homan's neurovascularly intact distally  Cardiac:  RRR, S1 S2 Respiratory:  ECTA B/L and A/P, Not using accessory muscles, speaking in full sentences- unlabored. Vascular:  Ext warm, no cyanosis apprec.; cap RF less 2 sec. Psych:  No HI/SI, judgement and insight good, Euthymic mood. Full Affect.

## 2017-11-09 NOTE — Patient Instructions (Signed)
Please use Dial soap soaks with warm water and put a warm washcloth over area while you have it elevated above the level of your heart.     for as long and often as you can during the day, please keep it elevated.  Continue to drink adequate amounts of water and take all antibiotics even if clearing up within a couple of days.  As discussed this should continue to slowly improve over time and not worsen.  If in a couple of days by Monday if it is worse or even over the weekend, if we are not here please go to an urgent care.  Pain and swelling as well as redness if you follow above instructions should not worsen with time with treatment and if it does then would you would need emergent further evaluation     Cellulitis, Adult Cellulitis is a skin infection. The infected area is usually red and tender. This condition occurs most often in the arms and lower legs. The infection can travel to the muscles, blood, and underlying tissue and become serious. It is very important to get treated for this condition. What are the causes? Cellulitis is caused by bacteria. The bacteria enter through a break in the skin, such as a cut, burn, insect bite, open sore, or crack. What increases the risk? This condition is more likely to occur in people who:  Have a weak defense system (immune system).  Have open wounds on the skin such as cuts, burns, bites, and scrapes. Bacteria can enter the body through these open wounds.  Are older.  Have diabetes.  Have a type of long-lasting (chronic) liver disease (cirrhosis) or kidney disease.  Use IV drugs.  What are the signs or symptoms? Symptoms of this condition include:  Redness, streaking, or spotting on the skin.  Swollen area of the skin.  Tenderness or pain when an area of the skin is touched.  Warm skin.  Fever.  Chills.  Blisters.  How is this diagnosed? This condition is diagnosed based on a medical history and physical exam. You may  also have tests, including:  Blood tests.  Lab tests.  Imaging tests.  How is this treated? Treatment for this condition may include:  Medicines, such as antibiotic medicines or antihistamines.  Supportive care, such as rest and application of cold or warm cloths (cold or warm compresses) to the skin.  Hospital care, if the condition is severe.  The infection usually gets better within 1-2 days of treatment. Follow these instructions at home:  Take over-the-counter and prescription medicines only as told by your health care provider.  If you were prescribed an antibiotic medicine, take it as told by your health care provider. Do not stop taking the antibiotic even if you start to feel better.  Drink enough fluid to keep your urine clear or pale yellow.  Do not touch or rub the infected area.  Raise (elevate) the infected area above the level of your heart while you are sitting or lying down.  Apply warm or cold compresses to the affected area as told by your health care provider.  Keep all follow-up visits as told by your health care provider. This is important. These visits let your health care provider make sure a more serious infection is not developing. Contact a health care provider if:  You have a fever.  Your symptoms do not improve within 1-2 days of starting treatment.  Your bone or joint underneath the infected area becomes  painful after the skin has healed.  Your infection returns in the same area or another area.  You notice a swollen bump in the infected area.  You develop new symptoms.  You have a general ill feeling (malaise) with muscle aches and pains. Get help right away if:  Your symptoms get worse.  You feel very sleepy.  You develop vomiting or diarrhea that persists.  You notice red streaks coming from the infected area.  Your red area gets larger or turns dark in color. This information is not intended to replace advice given to you by  your health care provider. Make sure you discuss any questions you have with your health care provider. Document Released: 08/03/2005 Document Revised: 03/03/2016 Document Reviewed: 09/02/2015 Elsevier Interactive Patient Education  [employer].

## 2017-11-14 ENCOUNTER — Encounter: Payer: Self-pay | Admitting: Family Medicine

## 2017-11-14 ENCOUNTER — Ambulatory Visit (INDEPENDENT_AMBULATORY_CARE_PROVIDER_SITE_OTHER): Payer: Self-pay | Admitting: Family Medicine

## 2017-11-14 VITALS — BP 126/80 | HR 84 | Ht 63.25 in | Wt 228.2 lb

## 2017-11-14 DIAGNOSIS — L0390 Cellulitis, unspecified: Secondary | ICD-10-CM

## 2017-11-14 DIAGNOSIS — E079 Disorder of thyroid, unspecified: Secondary | ICD-10-CM

## 2017-11-14 DIAGNOSIS — R601 Generalized edema: Secondary | ICD-10-CM

## 2017-11-14 DIAGNOSIS — E119 Type 2 diabetes mellitus without complications: Secondary | ICD-10-CM

## 2017-11-14 LAB — POCT GLYCOSYLATED HEMOGLOBIN (HGB A1C): HEMOGLOBIN A1C: 5.8

## 2017-11-14 MED ORDER — LEVOTHYROXINE SODIUM 100 MCG PO TABS: 100.0000 ug | ORAL_TABLET | Freq: Every day | ORAL | 1 refills | Status: DC

## 2017-11-14 MED ORDER — TOLTERODINE TARTRATE ER 2 MG PO CP24
2.0000 mg | ORAL_CAPSULE | Freq: Every day | ORAL | 1 refills | Status: DC
Start: 2017-11-14 — End: 2017-12-12

## 2017-11-14 NOTE — Progress Notes (Signed)
Impression and Recommendations:    1. Cellulitis, unspecified cellulitis site   2. Type 2 diabetes mellitus without complication, without long-term current use of insulin ([place])- Diet controlled   3. hypothyroidism   4. Generalized edema     1. Cellulitis: Pt instructed to finish your antibiotics as prescribed and to gently wrap the area with a non-stick bandage to avoid irritating the area with your pants. Allow the area to breathe by wearing shorts or short pants. Do not squeeze or press on the area- we want this to drain on its own. Continue using a warm compress to the area but discontinue using your shower head and running hot water over the wound. Use a non-stick bandage cover and keflex wrap at night while you are sleeping to avoid irritating the area. Alternatively you can place a damp washcloth underneath a heating pad at night. If your symptoms worsen after finishing your Abx, call the office for another visit with me or go to an urgent care if we are not in the office. Pt states she will schedule an appointment but will cancel it if her symptoms continue to improve.   2. Muscle spasms: Pt instructed to use an ice cup massage for muscle spasms, otherwise apply heat to the area. This is likely a result of changing your gait due to your cellulitis condition. Stretching and walking or moving around should help alleviate your symptoms.    Orders Placed This Encounter  Procedures  . POCT glycosylated hemoglobin (Hb A1C)    Meds ordered this encounter  Medications  . levothyroxine (SYNTHROID, LEVOTHROID) 100 MCG tablet    Sig: Take 1 tablet (100 mcg total) by mouth daily.    Dispense:  90 tablet    Refill:  1  . tolterodine (DETROL LA) 2 MG 24 hr capsule    Sig: Take 1 capsule (2 mg total) by mouth daily.    Dispense:  90 capsule    Refill:  1    Gross side effects, risk and benefits, and alternatives of medications and treatment plan in general discussed with patient.   Patient is aware that all medications have potential side effects and we are unable to predict every side effect or drug-drug interaction that may occur.   Patient will call with any questions prior to using medication if they have concerns.  Expresses verbal understanding and consents to current therapy and treatment regimen.  No barriers to understanding were identified.  Red flag symptoms and signs discussed in detail.  Patient expressed understanding regarding what to do in case of emergency\urgent symptoms  Please see AVS handed out to patient at the end of our visit for further patient instructions/ counseling done pertaining to today's office visit.   Return if symptoms worsen or fail to improve.    Note: This note was prepared with assistance of Dragon voice recognition software. Occasional wrong-word or sound-a-like substitutions may have occurred due to the inherent limitations of voice recognition software.  This document serves as a record of services personally performed by [name], DO. It was created on her behalf by Mayer Masker, a trained medical scribe. The creation of this record is based on the scribe's personal observations and the provider's statements to them.   I have reviewed the above medical documentation for accuracy and completeness and I concur.  [name] 11/14/17 6:01 PM   -------------------------------------------------------------------------------------------------------------------------------------------------------------------------------------------------------------------------------------------- Subjective:     HPI: Dawn Alexander is a 62 y.o. female who presents to  [facility] today for issues as discussed below.  Right leg:  Pt presented to the office on 11-09-17 for an acute visit where she was diagnosed with cellulitis to her RLE and prescribed keflex 500mg  TID.   Pt was compliant with her Abx use as well  as using a warm compress 3x/day to the area for the first day, which she then switched to placing a wash cloth with dial soap and running warm water using a shower head over the area- she does this 3 times daily.   The area on her leg is now draining and has coalesced into a blister appearing head of cellultis.   She states she has mild nausea when she takes her Abx but this is usually when she takes her medication without eating.  Tolerable to pt.   She denies vomiting, fever and chills and any other associated symptoms.  Pt c/o muscle spasms/ pains in her muscles of the lower leg - occurring ever since the cellulitis stared and she started walking different due to pain.  NO travel, no recent sx, pt has always had inc swelling on R LExt more than L- this has bween chroni cans well documented in prior OV's    Wt Readings from Last 3 Encounters:  11/14/17 228 lb 3.2 oz (103.5 kg)  11/09/17 233 lb (105.7 kg)  05/17/17 229 lb 14.4 oz (104.3 kg)   BP Readings from Last 3 Encounters:  11/14/17 126/80  11/09/17 138/85  05/17/17 120/77   Pulse Readings from Last 3 Encounters:  11/14/17 84  11/09/17 75  05/17/17 76   BMI Readings from Last 3 Encounters:  11/14/17 40.10 kg/m  11/09/17 40.95 kg/m  05/17/17 40.40 kg/m     Patient Care Team    Relationship Specialty Notifications Start End  [name], DO PCP - General Family Medicine  04/15/16   Salvadore Dom, MD Consulting Physician Obstetrics and Gynecology  03/16/17      Patient Active Problem List   Diagnosis Date Noted  . Class 3 obesity with serious comorbidity and body mass index (BMI) of 40.0 to 44.9 in adult 04/18/2016    Priority: High  . DM (diabetes mellitus) ([place])-  diet controlled 04/18/2016    Priority: High  . Hyperlipidemia associated with type 2 diabetes mellitus ([place]) 04/15/2016    Priority: High  . Hypertension associated with diabetes ([place]) 04/15/2016    Priority: High  . Uterine fibroid- noted on  CT scan from 3\21\18 07/15/2017    Priority: Medium  . Degenerative disc disease, lumbar- noted on CT 01-25-17 07/15/2017    Priority: Medium  . Venous (peripheral) insufficiency- B\L L Ext 07/01/2016    Priority: Medium  . Restless legs syndrome (RLS) 05/17/2016    Priority: Medium  . Insomnia 04/18/2016    Priority: Medium  . GERD (gastroesophageal reflux disease) 04/18/2016    Priority: Medium  . hypothyroidism 04/15/2016    Priority: Medium  . Urinary incontinence, urge 03/16/2017    Priority: Low  . Greater trochanteric bursitis of right hip 09/23/2016    Priority: Low  . Vitamin D deficiency 04/18/2016    Priority: Low  . Cellulitis 11/14/2017  . Chronic hip pain- R 03/16/2017  . Diverticulosis of sigmoid colon 03/16/2017  . Contusion of right hip- fell 12/22/16 01/02/2017  . Chronic Right lower extremity edema 07/01/2016  . Chronic foot pain 07/01/2016  . Chronic pain in right foot 07/01/2016  . Increased vitamin B12 level  due to excess supplements 05/17/2016  . seasonal or environmental allergies 05/16/2016  . Chronic pain 11/30/2011    Past Medical history, Surgical history, Family history, Social history, Allergies and Medications have been entered into the medical record, reviewed and changed as needed.    Current Meds  Medication Sig  . Acetaminophen-Caffeine (EXCEDRIN ASPIRIN FREE PO) Take by mouth daily as needed.  . cephALEXin (KEFLEX) 500 MG capsule Take 1 capsule (500 mg total) by mouth 3 (three) times daily.  Marland Kitchen levothyroxine (SYNTHROID, LEVOTHROID) 100 MCG tablet Take 1 tablet (100 mcg total) by mouth daily.  . Multiple Vitamin (MULTIVITAMIN) tablet Take 1 tablet by mouth daily.  . [DISCONTINUED] levothyroxine (SYNTHROID, LEVOTHROID) 100 MCG tablet Take 1 tablet (100 mcg total) by mouth daily.    Allergies:  No Known Allergies   Review of Systems:  A fourteen system review of systems was performed and found to be positive as per HPI.   Objective:     Blood pressure 126/80, pulse 84, height 5' 3.25" (1.607 m), weight 228 lb 3.2 oz (103.5 kg), SpO2 98 %. Body mass index is 40.1 kg/m. General:  Well Developed, well nourished, appropriate for stated age.  Neuro:  Alert and oriented,  extra-ocular muscles intact  HEENT:  Normocephalic, atraumatic, neck supple, no carotid bruits appreciated  Skin:  no gross rash, warm, pink.  Right leg: significant regression of the erythema of the leg- much improved cellulitis, coalescence of infection with appearance of a blistering of skin. No pus, but clear fluid, still edematous distally- which pt always had though.  Patient with palpable muscle spasms with tenderness of those spasms upon palpation along medial and lateral heads of gastrocnemius.  No Homans sign, Vasc intact distally. Cardiac:  RRR, S1 S2 Respiratory:  ECTA B/L and A/P, Not using accessory muscles, speaking in full sentences- unlabored. Vascular:  Ext warm, no cyanosis apprec.; cap RF less 2 sec. Psych:  No HI/SI, judgement and insight good, Euthymic mood. Full Affect.

## 2017-11-23 ENCOUNTER — Ambulatory Visit: Payer: BLUE CROSS/BLUE SHIELD | Admitting: Family Medicine

## 2017-12-11 ENCOUNTER — Telehealth: Payer: Self-pay

## 2017-12-11 NOTE — Telephone Encounter (Signed)
Patient requesting a increase in the strength of Detrol LA states that medication helps some but she is still having incontinence problems.  Plaease Advise. MPulliam, CMA/RT(R)

## 2017-12-12 ENCOUNTER — Other Ambulatory Visit: Payer: Self-pay

## 2017-12-12 MED ORDER — TOLTERODINE TARTRATE ER 4 MG PO CP24: 4.0000 mg | ORAL_CAPSULE | Freq: Every day | ORAL | 0 refills | Status: DC

## 2017-12-12 NOTE — Telephone Encounter (Signed)
Medication sent into the pharmacy and patient notified. MPulliam, CMA/RT(R)

## 2017-12-12 NOTE — Progress Notes (Signed)
Per Dr. Hershal Coria note "Okay to increase to 4 mg Detrol LA extended release capsule once daily.  Prescribed 90, no refill as patient will need a follow-up office visit with me to assess how she is doing on this new dose and new medication."  Medication sent to the pharmacy and patient notified. MPulliam, CMA/RT(R)

## 2017-12-12 NOTE — Telephone Encounter (Signed)
Okay to increase to 4 mg Detrol LA extended release capsule once daily.  Prescribed 90, no refill as patient will need a follow-up office visit with me to assess how she is doing on this new dose and new medication.  Thank you

## 2018-01-29 ENCOUNTER — Encounter: Payer: Self-pay | Admitting: Adult Health

## 2018-01-29 ENCOUNTER — Ambulatory Visit (INDEPENDENT_AMBULATORY_CARE_PROVIDER_SITE_OTHER): Payer: Self-pay | Admitting: Adult Health

## 2018-01-29 VITALS — BP 114/77 | HR 106 | Temp 97.7°F | Ht 63.27 in | Wt 229.0 lb

## 2018-01-29 DIAGNOSIS — J40 Bronchitis, not specified as acute or chronic: Secondary | ICD-10-CM | POA: Insufficient documentation

## 2018-01-29 MED ORDER — AZITHROMYCIN 250 MG PO TABS: ORAL_TABLET | ORAL | 0 refills | Status: DC

## 2018-01-29 MED ORDER — HYDROCOD POLST-CPM POLST ER 10-8 MG/5ML PO SUER: 5.0000 mL | Freq: Two times a day (BID) | ORAL | 0 refills | Status: DC | PRN

## 2018-01-29 NOTE — Progress Notes (Signed)
Subjective:    Patient ID: Dawn Alexander, female    DOB: 12-Feb-1956, 62 y.o.   MRN: [id number]  HPI:  Dawn Alexander presents with constant dry cough that has been steadily worsening >1.5 weeks. She has also had occasional clear nasal drainage. She has been around multiple grandchildren that have been acutely ill with flu and bronchitis. She has been pushing fluids and OTC Mucinex with only minimal sx relief. She denies fever/night sweats/N/V/D/CP/palpitations. She denies tobacco use.  Patient Care Team    Relationship Specialty Notifications Start End  [name], DO PCP - General Family Medicine  04/15/16   Salvadore Dom, MD Consulting Physician Obstetrics and Gynecology  03/16/17     Patient Active Problem List   Diagnosis Date Noted  . Bronchitis 01/29/2018  . Cellulitis 11/14/2017  . Uterine fibroid- noted on CT scan from 3\21\18 07/15/2017  . Degenerative disc disease, lumbar- noted on CT 01-25-17 07/15/2017  . Chronic hip pain- R 03/16/2017  . Urinary incontinence, urge 03/16/2017  . Diverticulosis of sigmoid colon 03/16/2017  . Contusion of right hip- fell 12/22/16 01/02/2017  . Greater trochanteric bursitis of right hip 09/23/2016  . Chronic Right lower extremity edema 07/01/2016  . Chronic foot pain 07/01/2016  . Venous (peripheral) insufficiency- B\L L Ext 07/01/2016  . Chronic pain in right foot 07/01/2016  . Increased vitamin B12 level due to excess supplements 05/17/2016  . Restless legs syndrome (RLS) 05/17/2016  . seasonal or environmental allergies 05/16/2016  . Insomnia 04/18/2016  . Vitamin D deficiency 04/18/2016  . Class 3 obesity with serious comorbidity and body mass index (BMI) of 40.0 to 44.9 in adult 04/18/2016  . DM (diabetes mellitus) ([place])-  diet controlled 04/18/2016  . GERD (gastroesophageal reflux disease) 04/18/2016  . Hyperlipidemia associated with type 2 diabetes mellitus ([place]) 04/15/2016  . Hypertension associated with diabetes  ([place]) 04/15/2016  . hypothyroidism 04/15/2016  . Chronic pain 11/30/2011     Past Medical History:  Diagnosis Date  . Allergy   . Chronic lower back pain   . Fibroid   . Insomnia   . Restless leg syndrome   . Thyroid disease   . Vitamin D deficiency      Past Surgical History:  Procedure Laterality Date  . ELBOW SURGERY Right 1999     Family History  Problem Relation Age of Onset  . Diabetes Mother   . COPD Mother   . Cancer Mother        lung  . Multiple sclerosis Father   . Heart disease Father   . Cancer Sister        lung  . Stroke Sister   . Cancer Brother        bladder  . Stroke Sister   . Leukemia Sister   . Alcohol abuse Brother   . Healthy Brother   . Healthy Daughter   . Leukemia Brother 5     Social History   Substance and Sexual Activity  Drug Use No     Social History   Substance and Sexual Activity  Alcohol Use Yes  . Alcohol/week: 3.0 oz  . Types: 5 Shots of liquor per week     Social History   Tobacco Use  Smoking Status Never Smoker  Smokeless Tobacco Never Used     Outpatient Encounter Medications as of 01/29/2018  Medication Sig  . Acetaminophen-Caffeine (EXCEDRIN ASPIRIN FREE PO) Take by mouth daily as needed.  Marland Kitchen azithromycin (ZITHROMAX) 250 MG tablet 2  tabs day one.  1 tab days 2-5.  . cephALEXin (KEFLEX) 500 MG capsule Take 1 capsule (500 mg total) by mouth 3 (three) times daily.  . chlorpheniramine-HYDROcodone (TUSSIONEX) 10-8 MG/5ML SUER Take 5 mLs by mouth every 12 (twelve) hours as needed for cough.  . levothyroxine (SYNTHROID, LEVOTHROID) 100 MCG tablet Take 1 tablet (100 mcg total) by mouth daily.  . Multiple Vitamin (MULTIVITAMIN) tablet Take 1 tablet by mouth daily.  Marland Kitchen tolterodine (DETROL LA) 4 MG 24 hr capsule Take 1 capsule (4 mg total) by mouth daily.   No facility-administered encounter medications on file as of 01/29/2018.     Allergies: Patient has no known allergies.  Body mass index is 40.22  kg/m.  Blood pressure 114/77, pulse (!) 106, temperature 97.7 F (36.5 C), height 5' 3.27" (1.607 m), weight 229 lb (103.9 kg), SpO2 98 %.      Review of Systems  Constitutional: Positive for activity change, appetite change and fatigue. Negative for chills, diaphoresis, fever and unexpected weight change.  HENT: Positive for congestion, postnasal drip, rhinorrhea, sinus pressure, sneezing and sore throat. Negative for sinus pain.   Eyes: Negative for visual disturbance.  Respiratory: Positive for cough. Negative for chest tightness, shortness of breath, wheezing and stridor.   Cardiovascular: Negative for chest pain, palpitations and leg swelling.  Gastrointestinal: Negative for abdominal distention, abdominal pain, blood in stool, constipation, diarrhea, nausea and vomiting.  Endocrine: Negative for cold intolerance, heat intolerance, polydipsia, polyphagia and polyuria.  Neurological: Negative for dizziness and headaches.  Hematological: Does not bruise/bleed easily.       Objective:   Physical Exam  Constitutional: She is oriented to person, place, and time. She appears well-developed and well-nourished. No distress.  HENT:  Head: Normocephalic and atraumatic.  Right Ear: External ear normal. Tympanic membrane is bulging. Tympanic membrane is not erythematous. No decreased hearing is noted.  Left Ear: External ear normal. Tympanic membrane is bulging. Tympanic membrane is not erythematous. No decreased hearing is noted.  Nose: Mucosal edema and rhinorrhea present. Right sinus exhibits no maxillary sinus tenderness and no frontal sinus tenderness. Left sinus exhibits no maxillary sinus tenderness and no frontal sinus tenderness.  Mouth/Throat: Uvula is midline and mucous membranes are normal. Posterior oropharyngeal edema and posterior oropharyngeal erythema present. No oropharyngeal exudate or tonsillar abscesses.  Eyes: Pupils are equal, round, and reactive to light. Conjunctivae  are normal.  Neck: Normal range of motion. Neck supple.  Cardiovascular: Regular rhythm, normal heart sounds and intact distal pulses. Tachycardia present.  No murmur heard. Pulmonary/Chest: Breath sounds normal. No respiratory distress. She has no decreased breath sounds. She has no wheezes. She has no rhonchi. She has no rales. She exhibits no tenderness.  Constant,strong cough during OV  Lymphadenopathy:    She has no cervical adenopathy.  Neurological: She is alert and oriented to person, place, and time.  Skin: Skin is warm and dry. No rash noted. She is not diaphoretic. No erythema. No pallor.  Psychiatric: She has a normal mood and affect. Her behavior is normal. Judgment and thought content normal.  Nursing note and vitals reviewed.         Assessment & Plan:   1. Bronchitis     Bronchitis Azithromycin and Tussionex as directed. [place] Controlled Substance Database reviewed-no aberrances noted. Increase fluids/rest/vit c 2,000/mg If cough does not improve after antibiotic completed, then please call clinic.    FOLLOW-UP:  Return if symptoms worsen or fail to improve.

## 2018-01-29 NOTE — Assessment & Plan Note (Addendum)
Azithromycin and Tussionex as directed. [place] Controlled Substance Database reviewed-no aberrances noted. Increase fluids/rest/vit c 2,000/mg If cough does not improve after antibiotic completed, then please call clinic.

## 2018-01-29 NOTE — Patient Instructions (Signed)

## 2018-02-02 ENCOUNTER — Telehealth: Payer: Self-pay | Admitting: Family Medicine

## 2018-02-02 NOTE — Telephone Encounter (Signed)
Patient called states [name] told there to call back if symptoms not improved by Friday,-- Patient states not better but worsening.  Patient uses:   [place], [place] - [place] [phone] (Phone) [phone] (Fax     If Rx are to be called in for her.  --glh

## 2018-02-02 NOTE — Telephone Encounter (Signed)
Pt states that finished Zpack today, but she is not feeling better, and not feeling worse.  Advised pt that [name] is out of the office until 02/06/18 and that I may not get a response from her until she returns to the office.  Please advise.  Charyl Bigger, CMA

## 2018-02-05 ENCOUNTER — Other Ambulatory Visit: Payer: Self-pay | Admitting: Adult Health

## 2018-02-05 MED ORDER — AMOXICILLIN-POT CLAVULANATE 875-125 MG PO TABS: 1.0000 | ORAL_TABLET | Freq: Two times a day (BID) | ORAL | 0 refills | Status: DC

## 2018-02-05 NOTE — Telephone Encounter (Signed)
Good Morning Tonya, Can you please call Ms. Blanchet and tell her I sent in course of Augmetin. Increase fluids/rest/vit c If she is still having sx's after this course, then please have her make OV. Thanks! [name]

## 2018-02-05 NOTE — Telephone Encounter (Signed)
Pt informed.  Pt expressed understanding and is agreeable.  T. Meredyth Hornung, CMA  

## 2018-02-05 NOTE — Progress Notes (Unsigned)
aug

## 2018-02-07 ENCOUNTER — Other Ambulatory Visit: Payer: Self-pay

## 2018-02-07 DIAGNOSIS — E079 Disorder of thyroid, unspecified: Secondary | ICD-10-CM

## 2018-02-07 MED ORDER — LEVOTHYROXINE SODIUM 100 MCG PO TABS: 100.0000 ug | ORAL_TABLET | Freq: Every day | ORAL | 0 refills | Status: DC

## 2018-02-07 NOTE — Telephone Encounter (Signed)
Patient called requesting refill on Levothyroxine. Reviewed patient chart and based on office policy filled for 30 days as patient needs follow.  She states that she will call and make appointment as soon as she is back in town. MPulliam, CMA/RT(R)

## 2018-03-13 ENCOUNTER — Encounter: Payer: Self-pay | Admitting: Family Medicine

## 2018-03-13 ENCOUNTER — Ambulatory Visit (INDEPENDENT_AMBULATORY_CARE_PROVIDER_SITE_OTHER): Payer: Self-pay | Admitting: Family Medicine

## 2018-03-13 DIAGNOSIS — E559 Vitamin D deficiency, unspecified: Secondary | ICD-10-CM

## 2018-03-13 DIAGNOSIS — Z8639 Personal history of other endocrine, nutritional and metabolic disease: Secondary | ICD-10-CM | POA: Insufficient documentation

## 2018-03-13 DIAGNOSIS — E7841 Elevated Lipoprotein(a): Secondary | ICD-10-CM | POA: Insufficient documentation

## 2018-03-13 DIAGNOSIS — R7303 Prediabetes: Secondary | ICD-10-CM | POA: Insufficient documentation

## 2018-03-13 DIAGNOSIS — N3941 Urge incontinence: Secondary | ICD-10-CM

## 2018-03-13 DIAGNOSIS — Z8679 Personal history of other diseases of the circulatory system: Secondary | ICD-10-CM

## 2018-03-13 DIAGNOSIS — E079 Disorder of thyroid, unspecified: Secondary | ICD-10-CM

## 2018-03-13 MED ORDER — DULOXETINE HCL 30 MG PO CPEP: 30.0000 mg | ORAL_CAPSULE | Freq: Every day | ORAL | 0 refills | Status: DC

## 2018-03-13 NOTE — Progress Notes (Signed)
Impression and Recommendations:    1. Morbid obesity ([place])   2. Prediabetes   3. History of hypertension   4. Elevated lipoprotein(a)   5. Urinary incontinence, urge   6. hypothyroidism   7. Vitamin D deficiency    1. Pre-Diabetes  - Last A1c was over 3 months ago (January 2019) at 5.8.  - Discussed with patient today that even though her A1c is 5.8 now, she is a diagnosed diabetic due to past A1c's being over 6.5 and her meeting the criteria.  Explained that once a diabetic always a diabetic is just a matter of whether or not she is a diet controlled 1 etc.  - Advised patient to lose weight to increase her body's sensitivity to insulin, and lower her blood sugars with prudent diet and exercise.  2. Thyroid - Stable at last check July 2018.  Continue synthroid.  3. Vitamin Supplementation - Continue supplementation as established.  4. Blood Pressure - Discussed goal blood pressure of 130/80 or less, she remains off medications and at goal.  -She was on lisinopril in the past but due to healthier dietary and lifestyle modifications was able to come off.  I encouraged patient to monitor occasionally at home  - Lifestyle changes such as dash diet and engaging in a regular exercise program discussed with patient.   5. Urinary Incontinence -We discontinued Detrol LA as it was doing nothing for the patient   - did discuss risk benefits of Cymbalta with her.  We will start this for her urinary incontinence but I did explain that weight loss is #1 thing to help with her symptoms as well as bladder training, Keagle's exercise etc.  - Discussed lifestyle modifications including bladder training as well as doing kegel exercises regularly.  Handouts provided on both of these.  - Highly encouraged weight loss-which would have the largest effect on helping her with the symptoms  - Patient declines referral to PT/OT therapy for bladder training and pelvic floor training at this  time.    6. BMI Counseling Explained to patient what BMI refers to, and what it means medically.    Told patient to think about it as a "medical risk stratification measurement" and how increasing BMI is associated with increasing risk/ or worsening state of various diseases such as hypertension, hyperlipidemia, diabetes, premature OA, depression etc.  American Heart Association guidelines for healthy diet, basically Mediterranean diet, and exercise guidelines of 30 minutes 5 days per week or more discussed in detail.  Health counseling performed.  All questions answered.   7. Exercise & Lifestyle Habits - Advised patient to begin working toward exercising to improve health and blood flow.  - Patient may begin with 15 minutes of activity daily.  Recommended that the patient eventually strive for at least 150 minutes of moderate cardiovascular activity per week according to guidelines established by the [facility]   - Healthy dietary habits encouraged, including low-carb, and high amounts of lean protein in diet.   - Download the [employer] app or MyFitnessPal to track her intake.  - Patient should also consume adequate amounts of water - half of body weight in oz of water per day.  8. Lab Work - Last check was July 2018.  Will be due in future    Education and routine counseling performed. Handouts provided.    Meds ordered this encounter  Medications  . DULoxetine (CYMBALTA) 30 MG capsule    Sig: Take 1 capsule (30 mg total)  by mouth daily.    Dispense:  90 capsule    Refill:  0    Follow-up: Return for 42months- for cpe and FBW; sooner if concerns re new med for incontinence-duloxetine.   The patient was counseled, risk factors were discussed, anticipatory guidance given.  Gross side effects, risk and benefits, and alternatives of medications discussed with patient.  Patient is aware that all medications have potential side effects and we are unable to predict every side effect or  drug-drug interaction that may occur.  Expresses verbal understanding and consents to current therapy plan and treatment regimen.  Please see AVS handed out to patient at the end of our visit for further patient instructions/ counseling done pertaining to today's office visit.    Note: This document was prepared using Dragon voice recognition software and may include unintentional dictation errors.  This document serves as a record of services personally performed by [name], DO. It was created on her behalf by Toni Amend, a trained medical scribe. The creation of this record is based on the scribe's personal observations and the provider's statements to them.   I have reviewed the above medical documentation for accuracy and completeness and I concur.  [name] 03/13/18 1:05 PM    Subjective:    Chief Complaint  Patient presents with  . Follow-up    Dawn Alexander is a 62 y.o. female who presents to [facility] [facility] today for care of chronic health issues and Diabetes Management.    Mood Denies issues with mood.  Thyroid Last check was December of 2018. No acute complaints Takes synthroid every day.   Vitamins & Supplementation Takes two capsules of 2000 IU's of Vitamin D every day, as well as vitamin C, Super B Complex, Vitamin B12, and magnesium. -Has poor diet, not well-balanced and poor choices  Weight -Lowest since she has been seeing me was 224 pounds with a BMI just under 40. Patient does not feel good at her current weight. -She is not currently exercising/or watching her diet. -She is not currently ready to do anything about it. -She has not followed our advice in the past regarding dietary changes and lifestyle changes to help her with weight loss   Right LE Swelling Patient believes she may be developing lymphedema.  Reports family history in her father. Notes occassionally using compression socks on her right  leg.   Pre-DM HPI:  -A1c 6.2 in June 2017  -  She has not been working on diet and exercise for pre-diabetes.  Patient has not been keeping her diet under control and has not been exercising.  Notes that she's "moving all the time ", but hasn't been adhering to a designated activity plan.  Just got back from the [place] they ate and drank a lot  Patient does not take medications and does not monitor her blood glucose at home.   Denies polyuria/polydipsia.  Denies hypo/ hyperglycemia symptoms  - She denies new onset of: chest pain, exercise intolerance, shortness of breath, dizziness, visual changes, headache, lower extremity swelling or claudication.    Last A1C in the office was:  Lab Results  Component Value Date   HGBA1C 5.8 11/14/2017   HGBA1C 6.0 (H) 05/18/2017   HGBA1C 5.8 08/05/2016    Lab Results  Component Value Date   MICROALBUR 30 08/05/2016   LDLCALC 107 (H) 05/18/2017   CREATININE 0.79 05/18/2017    Last 3 blood pressure readings in our office  are as follows: BP Readings from Last 3 Encounters:  03/13/18 124/74  01/29/18 114/77  11/14/17 126/80    BMI Readings from Last 3 Encounters:  03/13/18 40.74 kg/m  01/29/18 40.22 kg/m  11/14/17 40.10 kg/m     Problem  Prediabetes  Elevated Lipoprotein(a)  Morbid Obesity ([facility])  History of Hypertension  History of Non Anemic Vitamin B12 Deficiency      Patient Care Team    Relationship Specialty Notifications Start End  [name], DO PCP - General Family Medicine  04/15/16   Salvadore Dom, MD Consulting Physician Obstetrics and Gynecology  03/16/17      Patient Active Problem List   Diagnosis Date Noted  . Prediabetes 03/13/2018    Priority: High  . Elevated lipoprotein(a) 03/13/2018    Priority: High  . Morbid obesity ([place]) 04/18/2016    Priority: High  . History of hypertension 03/13/2018    Priority: Medium  . Uterine fibroid- noted on CT scan from 3\21\18  07/15/2017    Priority: Medium  . Degenerative disc disease, lumbar- noted on CT 01-25-17 07/15/2017    Priority: Medium  . Venous (peripheral) insufficiency- B\L L Ext 07/01/2016    Priority: Medium  . Restless legs syndrome (RLS) 05/17/2016    Priority: Medium  . Insomnia 04/18/2016    Priority: Medium  . GERD (gastroesophageal reflux disease) 04/18/2016    Priority: Medium  . hypothyroidism 04/15/2016    Priority: Medium  . Urinary incontinence, urge 03/16/2017    Priority: Low  . Greater trochanteric bursitis of right hip 09/23/2016    Priority: Low  . Vitamin D deficiency 04/18/2016    Priority: Low  . History of non anemic vitamin B12 deficiency 03/13/2018  . Bronchitis 01/29/2018  . Cellulitis 11/14/2017  . Chronic hip pain- R 03/16/2017  . Diverticulosis of sigmoid colon 03/16/2017  . Contusion of right hip- fell 12/22/16 01/02/2017  . Chronic Right lower extremity edema 07/01/2016  . Chronic foot pain 07/01/2016  . Chronic pain in right foot 07/01/2016  . Increased vitamin B12 level due to excess supplements 05/17/2016  . seasonal or environmental allergies 05/16/2016  . Chronic pain 11/30/2011     Past Medical History:  Diagnosis Date  . Allergy   . Chronic lower back pain   . Fibroid   . Insomnia   . Restless leg syndrome   . Thyroid disease   . Vitamin D deficiency      Past Surgical History:  Procedure Laterality Date  . ELBOW SURGERY Right 1999     Family History  Problem Relation Age of Onset  . Diabetes Mother   . COPD Mother   . Cancer Mother        lung  . Multiple sclerosis Father   . Heart disease Father   . Cancer Sister        lung  . Stroke Sister   . Cancer Brother        bladder  . Stroke Sister   . Leukemia Sister   . Alcohol abuse Brother   . Healthy Brother   . Healthy Daughter   . Leukemia Brother 5     Social History   Substance and Sexual Activity  Drug Use No  ,  Social History   Substance and Sexual  Activity  Alcohol Use Yes  . Alcohol/week: 3.0 oz  . Types: 5 Shots of liquor per week  ,  Social History   Tobacco Use  Smoking Status Never Smoker  Smokeless Tobacco Never Used  ,    Current Outpatient Medications on File Prior to Visit  Medication Sig Dispense Refill  . Acetaminophen-Caffeine (EXCEDRIN ASPIRIN FREE PO) Take by mouth daily as needed.    . Ascorbic Acid (VITAMIN C) 1000 MG tablet Take 2,000 mg by mouth daily.    . Cholecalciferol (VITAMIN D3 SUPER STRENGTH) 2000 units CAPS Take 4,000 Units by mouth daily.    Marland Kitchen levothyroxine (SYNTHROID, LEVOTHROID) 100 MCG tablet Take 1 tablet (100 mcg total) by mouth daily. 30 tablet 0  . MAGNESIUM PO Take 1 tablet by mouth daily.    . SUPER B COMPLEX/C PO Take 1 tablet by mouth daily.    Marland Kitchen tolterodine (DETROL LA) 4 MG 24 hr capsule Take 1 capsule (4 mg total) by mouth daily. 90 capsule 0  . UNABLE TO FIND 1 tablet daily. Med Name: Eye Care Vitamin    . vitamin B-12 (CYANOCOBALAMIN) 500 MCG tablet Take 500 mcg by mouth daily.     No current facility-administered medications on file prior to visit.      No Known Allergies   Review of Systems:   General:  Denies fever, chills Optho/Auditory:   Denies visual changes, blurred vision Respiratory:   Denies SOB, cough, wheeze, DIB  Cardiovascular:   Denies chest pain, palpitations, painful respirations Gastrointestinal:   Denies nausea, vomiting, diarrhea.  Endocrine:     Denies new hot or cold intolerance Musculoskeletal:  Denies joint swelling, gait issues, or new unexplained myalgias/ arthralgias Skin:  Denies rash, suspicious lesions  Neurological:    Denies dizziness, unexplained weakness, numbness  Psychiatric/Behavioral:   Denies mood changes    Objective:     Blood pressure 124/74, pulse 67, height 5\' 3"  (1.6 m), weight 230 lb (104.3 kg), SpO2 98 %.  Body mass index is 40.74 kg/m.  General: Well Developed, well nourished, and in no acute distress.  HEENT:  Normocephalic, atraumatic, pupils equal round reactive to light, neck supple, No carotid bruits, no JVD Skin: Warm and dry, cap RF less 2 sec Cardiac: Regular rate and rhythm, S1, S2 WNL's, no murmurs rubs or gallops Respiratory: ECTA B/L, Not using accessory muscles, speaking in full sentences. NeuroM-Sk: Ambulates w/o assistance, moves ext * 4 w/o difficulty, sensation grossly intact.  Ext: scant edema b/l lower ext Psych: No HI/SI, judgement and insight good, Euthymic mood. Full Affect. Right Lower Extremity: With chronic pitting edema- now worse than left due to recent cellulitis.

## 2018-03-13 NOTE — Patient Instructions (Signed)
Please see separate handouts that I printed off of up to date regarding pelvic floor muscle training and bladder training exercises.  Please let me know if you change your mind regarding referral for further therapy for this.  It can be extremely beneficial.  -As mentioned as well, you can start tracking your food intake using the lose it app or my fitness pal app which can be very beneficial to help with weight loss and healthier dietary habits in general.    Behavior Modification Ideas for Weight Management  Weight management involves adopting a healthy lifestyle that includes a knowledge of nutrition and exercise, a positive attitude and the right kind of motivation. Internal motives such as better health, increased energy, self-esteem and personal control increase your chances of lifelong weight management success.  Remember to have realistic goals and think long-term success. Believe in yourself and you can do it. The following information will give you ideas to help you meet your goals.  Control Your Home Environment  Eat only while sitting down at the kitchen or dining room table. Do not eat while watching television, reading, cooking, talking on the phone, standing at the refrigerator or working on the computer. Keep tempting foods out of the house - don't buy them. Keep tempting foods out of sight. Have low-calorie foods ready to eat. Unless you are preparing a meal, stay out of the kitchen. Have healthy snacks at your disposal, such as small pieces of fruit, vegetables, canned fruit, pretzels, low-fat string cheese and nonfat cottage cheese.  Control Your Work Environment  Do not eat at [employer] or keep tempting snacks at your desk. If you get hungry between meals, plan healthy snacks and bring them with you to work. During your breaks, go for a walk instead of eating. If you work around food, plan in advance the one item you will eat at mealtime. Make it inconvenient to nibble  on food by chewing gum, sugarless candy or drinking water or another low-calorie beverage. Do not work through meals. Skipping meals slows down metabolism and may result in overeating at the next meal. If food is available for special occasions, either pick the healthiest item, nibble on low-fat snacks brought from home, don't have anything offered, choose one option and have a small amount, or have only a beverage.  Control Your Mealtime Environment  Serve your plate of food at the stove or kitchen counter. Do not put the serving dishes on the table. If you do put dishes on the table, remove them immediately when finished eating. Fill half of your plate with vegetables, a quarter with lean protein and a quarter with starch. Use smaller plates, bowls and glasses. A smaller portion will look large when it is in a little dish. Politely refuse second helpings. When fixing your plate, limit portions of food to one scoop/serving or less.   Daily Food Management  Replace eating with another activity that you will not associate with food. Wait 20 minutes before eating something you are craving. Drink a large glass of water or diet soda before eating. Always have a big glass or bottle of water to drink throughout the day. Avoid high-calorie add-ons such as cream with your coffee, butter, mayonnaise and salad dressings.  Shopping: Do not shop when hungry or tired. Shop from a list and avoid buying anything that is not on your list. If you must have tempting foods, buy individual-sized packages and try to find a lower-calorie alternative. Don't taste test  in the store. Read food labels. Compare products to help you make the healthiest choices.  Preparation: Chew a piece of gum while cooking meals. Use a quarter teaspoon if you taste test your food. Try to only fix what you are going to eat, leaving yourself no chance for seconds. If you have prepared more food than you need, portion it into  individual containers and freeze or refrigerate immediately. Don't snack while cooking meals.  Eating: Eat slowly. Remember it takes about 20 minutes for your stomach to send a message to your brain that it is full. Don't let fake hunger make you think you need more. The ideal way to eat is to take a bite, put your utensil down, take a sip of water, cut your next bite, take a bit, put your utensil down and so on. Do not cut your food all at one time. Cut only as needed. Take small bites and chew your food well. Stop eating for a minute or two at least once during a meal or snack. Take breaks to reflect and have conversation.  Cleanup and Leftovers: Label leftovers for a specific meal or snack. Freeze or refrigerate individual portions of leftovers. Do not clean up if you are still hungry.  Eating Out and Social Eating  Do not arrive hungry. Eat something light before the meal. Try to fill up on low-calorie foods, such as vegetables and fruit, and eat smaller portions of the high-calorie foods. Eat foods that you like, but choose small portions. If you want seconds, wait at least 20 minutes after you have eaten to see if you are actually hungry or if your eyes are bigger than your stomach. Limit alcoholic beverages. Try a soda water with a twist of lime. Do not skip other meals in the day to save room for the special event.  At Restaurants: Order  la carte rather than buffet style. Order some vegetables or a salad for an appetizer instead of eating bread. If you order a high-calorie dish, share it with someone. Try an after-dinner mint with your coffee. If you do have dessert, share it with two or more people. Don't overeat because you do not want to waste food. Ask for a doggie bag to take extra food home. Tell the server to put half of your entree in a to go bag before the meal is served to you. Ask for salad dressing, gravy or high-fat sauces on the side. Dip the tip of your fork in  the dressing before each bite. If bread is served, ask for only one piece. Try it plain without butter or oil. At [employer] where oil and vinegar is served with bread, use only a small amount of oil and a lot of vinegar for dipping.  At a Friend[facility] House: Offer to bring a dish, appetizer or dessert that is low in calories. Serve yourself small portions or tell the host that you only want a small amount. Stand or sit away from the snack table. Stay away from the kitchen or stay busy if you are near the food. Limit your alcohol intake.  At [employer] and Cafeterias: Cover most of your plate with lettuce and/or vegetables. Use a salad plate instead of a dinner plate. After eating, clear away your dishes before having coffee or tea.  Entertaining at Home: Explore low-fat, low-cholesterol cookbooks. Use single-serving foods like chicken breasts or hamburger patties. Prepare low-calorie appetizers and desserts.   Holidays: Keep tempting foods out of sight. Decorate  the house without using food. Have low-calorie beverages and foods on hand for guests. Allow yourself one planned treat a day. Don't skip meals to save up for the holiday feast. Eat regular, planned meals.   Exercise Well  Make exercise a priority and a planned activity in the day. If possible, walk the entire or part of the distance to work. Get an exercise buddy. Go for a walk with a colleague during one of your breaks, go to the gym, run or take a walk with a friend, walk in the mall with a shopping companion. Park at the end of the parking lot and walk to the store or office entrance. Always take the stairs all of the way or at least part of the way to your floor. If you have a desk job, walk around the office frequently. Do leg lifts while sitting at your desk. Do something outside on the weekends like going for a hike or a bike ride.   Have a Healthy Attitude  Make health your weight management priority. Be  realistic. Have a goal to achieve a healthier you, not necessarily the lowest weight or ideal weight based on calculations or tables. Focus on a healthy eating style, not on dieting. Dieting usually lasts for a short amount of time and rarely produces long-term success. Think long term. You are developing new healthy behaviors to follow next month, in a year and in a decade.    This information is for educational purposes only and is not intended to replace the advice of your doctor or health care provider. We encourage you to discuss with your doctor any questions or concerns you may have.        Guidelines for Losing Weight   We want weight loss that will last so you should lose 1-2 pounds a week.  THAT IS IT! Please pick THREE things a month to change. Once it is a habit check off the item. Then pick another three items off the list to become habits.  If you are already doing a habit on the list GREAT!  Cross that item off!  Don't drink your calories. Ie, alcohol, soda, fruit juice, and sweet tea.   Drink more water. Drink a glass when you feel hungry or before each meal.   Eat breakfast - Complex carb and protein (likeDannon light and fit yogurt, oatmeal, fruit, eggs, [place] bacon).  Measure your cereal.  Eat no more than one cup a day. (ie Kashi)  Eat an apple a day.  Add a vegetable a day.  Try a new vegetable a month.  Use Pam! Stop using oil or butter to cook.  Don't finish your plate or use smaller plates.  Share your dessert.  Eat sugar free Jello for dessert or frozen grapes.  Don't eat 2-3 hours before bed.  Switch to whole wheat bread, pasta, and brown rice.  Make healthier choices when you eat out. No fries!  Pick baked chicken, NOT fried.  Don't forget to SLOW DOWN when you eat. It is not going anywhere.   Take the stairs.  Park far away in the parking lot  Lift soup cans (or weights) for 10 minutes while watching TV.  Walk at work for 10 minutes  during break.  Walk outside 1 time a week with your friend, kids, dog, or significant other.  Start a walking group at church.  Walk the mall as much as you can tolerate.   Keep a food diary.  Weigh yourself daily.  Walk for 15 minutes 3 days per week.  Cook at home more often and eat out less. If life happens and you go back to old habits, it is okay.  Just start over. You can do it!  If you experience chest pain, get short of breath, or tired during the exercise, please stop immediately and inform your doctor.    Before you even begin to attack a weight-loss plan, it pays to remember this: You are not fat. You have fat. Losing weight isn't about blame or shame; it's simply another achievement to accomplish. Dieting is like any other skill-you have to buckle down and work at it. As long as you act in a smart, reasonable way, you'll ultimately get where you want to be. Here are some weight loss pearls for you.   1. It's Not a Diet. It's a Lifestyle Thinking of a diet as something you're on and suffering through only for the short term doesn't work. To shed weight and keep it off, you need to make permanent changes to the way you eat. It's OK to indulge occasionally, of course, but if you cut calories temporarily and then revert to your old way of eating, you'll gain back the weight quicker than you can say yo-yo. Use it to lose it. Research shows that one of the best predictors of long-term weight loss is how many pounds you drop in the first month. For that reason, nutritionists often suggest being stricter for the first two weeks of your new eating strategy to build momentum. Cut out added sugar and alcohol and avoid unrefined carbs. After that, figure out how you can reincorporate them in a way that's healthy and maintainable.  2. There's a Right Way to Exercise Working out burns calories and fat and boosts your metabolism by building muscle. But those trying to lose weight are notorious  for overestimating the number of calories they burn and underestimating the amount they take in. Unfortunately, your system is biologically programmed to hold on to extra pounds and that means when you start exercising, your body senses the deficit and ramps up its hunger signals. If you're not diligent, you'll eat everything you burn and then some. Use it, to lose it. Cardio gets all the exercise glory, but strength and interval training are the real heroes. They help you build lean muscle, which in turn increases your metabolism and calorie-burning ability 3. Don't Overreact to Mild Hunger Some people have a hard time losing weight because of hunger anxiety. To them, being hungry is bad-something to be avoided at all costs-so they carry snacks with them and eat when they don't need to. Others eat because they're stressed out or bored. While you never want to get to the point of being ravenous (that's when bingeing is likely to happen), a hunger pang, a craving, or the fact that it's 3:00 p.m. should not send you racing for the vending machine or obsessing about the energy bar in your purse. Ideally, you should put off eating until your stomach is growling and it's difficult to concentrate.  Use it to lose it. When you feel the urge to eat, use the HALT method. Ask yourself, Am I really hungry? Or am I angry or anxious, lonely or bored, or tired? If you're still not certain, try the apple test. If you're truly hungry, an apple should seem delicious; if it doesn't, something else is going on. Or you can try drinking water and making  yourself busy, if you are still hungry try a healthy snack.  4. Not All Calories Are Created Equal The mechanics of weight loss are pretty simple: Take in fewer calories than you use for energy. But the kind of food you eat makes all the difference. Processed food that's high in saturated fat and refined starch or sugar can cause inflammation that disrupts the hormone signals that  tell your brain you're full. The result: You eat a lot more.  Use it to lose it. Clean up your diet. Swap in whole, unprocessed foods, including vegetables, lean protein, and healthy fats that will fill you up and give you the biggest nutritional bang for your calorie buck. In a few weeks, as your brain starts receiving regular hunger and fullness signals once again, you'll notice that you feel less hungry overall and naturally start cutting back on the amount you eat.  5. Protein, Produce, and Plant-Based Fats Are Your Weight-Loss Trinity Here's why eating the three Ps regularly will help you drop pounds. Protein fills you up. You need it to build lean muscle, which keeps your metabolism humming so that you can torch more fat. People in a weight-loss program who ate double the recommended daily allowance for protein (about 110 grams for a 150-pound woman) lost 70 percent of their weight from fat, while people who ate the RDA lost only about 40 percent, one study found. Produce is packed with filling fiber. "It's very difficult to consume too many calories if you're eating a lot of vegetables. Example: Three cups of broccoli is a lot of food, yet only 93 calories. (Fruit is another story. It can be easy to overeat and can contain a lot of calories from sugar, so be sure to monitor your intake.) Plant-based fats like olive oil and those in avocados and nuts are healthy and extra satiating.  Use it to lose it. Aim to incorporate each of the three Ps into every meal and snack. People who eat protein throughout the day are able to keep weight off, according to a study in the [employer]. In addition to meat, poultry and seafood, good sources are beans, lentils, eggs, tofu, and yogurt. As for fat, keep portion sizes in check by measuring out salad dressing, oil, and nut butters (shoot for one to two tablespoons). Finally, eat veggies or a little fruit at every meal. People who did that  consumed 308 fewer calories but didn't feel any hungrier than when they didn't eat more produce.  7. How You Eat Is As Important As What You Eat In order for your brain to register that you're full, you need to focus on what you're eating. Sit down whenever you eat, preferably at a table. Turn off the TV or computer, put down your phone, and look at your food. Smell it. Chew slowly, and don't put another bite on your fork until you swallow. When women ate lunch this attentively, they consumed 30 percent less when snacking later than those who listened to an audiobook at lunchtime, according to a study in the [employer]. 8. Weighing Yourself Really Works The scale provides the best evidence about whether your efforts are paying off. Seeing the numbers tick up or down or stagnate is motivation to keep going-or to rethink your approach. A 2015 study at [facility] found that daily weigh-ins helped people lose more weight, keep it off, and maintain that loss, even after two years. Use it to lose  it. Step on the scale at the same time every day for the best results. If your weight shoots up several pounds from one weigh-in to the next, don't freak out. Eating a lot of salt the night before or having your period is the likely culprit. The number should return to normal in a day or two. It's a steady climb that you need to do something about. 9. Too Much Stress and Too Little Sleep Are Your Enemies When you're tired and frazzled, your body cranks up the production of cortisol, the stress hormone that can cause carb cravings. Not getting enough sleep also boosts your levels of ghrelin, a hormone associated with hunger, while suppressing leptin, a hormone that signals fullness and satiety. People on a diet who slept only five and a half hours a night for two weeks lost 55 percent less fat and were hungrier than those who slept eight and a half hours, according to a study in the [place]. Use it to lose it. Prioritize sleep, aiming for seven hours or more a night, which research shows helps lower stress. And make sure you're getting quality zzz's. If a snoring spouse or a fidgety cat wakes you up frequently throughout the night, you may end up getting the equivalent of just four hours of sleep, according to a study from [facility]. Keep pets out of the bedroom, and use a white-noise app to drown out snoring. 10. You Will Hit a plateau-And You Can Bust Through It As you slim down, your body releases much less leptin, the fullness hormone.  If you're not strength training, start right now. Building muscle can raise your metabolism to help you overcome a plateau. To keep your body challenged and burning calories, incorporate new moves and more intense intervals into your workouts or add another sweat session to your weekly routine. Alternatively, cut an extra 100 calories or so a day from your diet. Now that you've lost weight, your body simply doesn't need as much fuel.    Since food equals calories, in order to lose weight you must either eat fewer calories, exercise more to burn off calories with activity, or both. Food that is not used to fuel the body is stored as fat. A major component of losing weight is to make smarter food choices. Here's how:  1)   Limit non-nutritious foods, such as: Sugar, honey, syrups and candy Pastries, donuts, pies, cakes and cookies Soft drinks, sweetened juices and alcoholic beverages  2)  Cut down on high-fat foods by: - Choosing poultry, fish or lean red meat - Choosing low-fat cooking methods, such as baking, broiling, steaming, grilling and boiling - Using low-fat or non-fat dairy products - Using vinaigrette, herbs, lemon or fat-free salad dressings - Avoiding fatty meats, such as bacon, sausage, franks, ribs and luncheon meats - Avoiding high-fat snacks like nuts, chips and chocolate - Avoiding fried  foods - Using less butter, margarine, oil and mayonnaise - Avoiding high-fat gravies, cream sauces and cream-based soups  3) Eat a variety of foods, including: - Fruit and vegetables that are raw, steamed or baked - Whole grains, breads, cereal, rice and pasta - Dairy products, such as low-fat or non-fat milk or yogurt, low-fat cottage cheese and low-fat cheese - Protein-rich foods like chicken, [place], fish, lean meat and legumes, or beans  4) Change your eating habits by: - Eat three balanced meals a day to help control your hunger - Watch portion sizes and  eat small servings of a variety of foods - Choose low-calorie snacks - Eat only when you are hungry and stop when you are satisfied - Eat slowly and try not to perform other tasks while eating - Find other activities to distract you from food, such as walking, taking up a hobby or being involved in the community - Include regular exercise in your daily routine ( minimum of 20 min of moderate-intensity exercise at least 5 days/week)  - Find a support group, if necessary, for emotional support in your weight loss journey           Easy ways to cut 100 calories   1. Eat your eggs with hot sauce OR salsa instead of cheese.  Eggs are great for breakfast, but many people consider eggs and cheese to be BFFs. Instead of cheese-1 oz. of cheddar has 114 calories-top your eggs with hot sauce, which contains no calories and helps with satiety and metabolism. Salsa is also a great option!!  2. Top your toast, waffles or pancakes with fresh berries instead of jelly or syrup. Half a cup of berries-fresh, frozen or thawed-has about 40 calories, compared with 2 tbsp. of maple syrup or jelly, which both have about 100 calories. The berries will also give you a good punch of fiber, which helps keep you full and satisfied and won't spike blood sugar quickly like the jelly or syrup. 3. Swap the non-fat latte for black coffee with a splash of  half-and-half. Contrary to its name, that non-fat latte has 130 calories and a startling 19g of carbohydrates per 16 oz. serving. Replacing that 'light' drinkable dessert with a black coffee with a splash of half-and-half saves you more than 100 calories per 16 oz. serving. 4. Sprinkle salads with freeze-dried raspberries instead of dried cranberries. If you want a sweet addition to your nutritious salad, stay away from dried cranberries. They have a whopping 130 calories per  cup and 30g carbohydrates. Instead, sprinkle freeze-dried raspberries guilt-free and save more than 100 calories per  cup serving, adding 3g of belly-filling fiber. 5. Go for mustard in place of mayo on your sandwich. Mustard can add really nice flavor to any sandwich, and there are tons of varieties, from spicy to honey. A serving of mayo is 95 calories, versus 10 calories in a serving of mustard.  Or try an avocado mayo spread: You can find the recipe few click this link: https://www.californiaavocado.com/recipes/recipe-container/[place]-avocado-mayo 6. Choose a DIY salad dressing instead of the store-bought kind. Mix Dijon or whole grain mustard with low-fat Kefir or red wine vinegar and garlic. 7. Use hummus as a spread instead of a dip. Use hummus as a spread on a high-fiber cracker or tortilla with a sandwich and save on calories without sacrificing taste. 8. Pick just one salad "accessory." Salad isn't automatically a calorie winner. It's easy to over-accessorize with toppings. Instead of topping your salad with nuts, avocado and cranberries (all three will clock in at 313 calories), just pick one. The next day, choose a different accessory, which will also keep your salad interesting. You don't wear all your jewelry every day, right? 9. Ditch the white pasta in favor of spaghetti squash. One cup of cooked spaghetti squash has about 40 calories, compared with traditional spaghetti, which comes with more than 200.  Spaghetti squash is also nutrient-dense. It's a good source of fiber and Vitamins A and C, and it can be eaten just like you would eat pasta-with a great tomato sauce  and [place] meatballs or with pesto, tofu and spinach, for example. 10. Dress up your chili, soups and stews with non-fat [place] yogurt instead of sour cream. Just a 'dollop' of sour cream can set you back 115 calories and a whopping 12g of fat-seven of which are of the artery-clogging variety. Added bonus: [place] yogurt is packed with muscle-building protein, calcium and B Vitamins. 11. Mash cauliflower instead of mashed potatoes. One cup of traditional mashed potatoes-in all their creamy goodness-has more than 200 calories, compared to mashed cauliflower, which you can typically eat for less than 100 calories per 1 cup serving. Cauliflower is a great source of the antioxidant indole-3-carbinol (I3C), which may help reduce the risk of some cancers, like breast cancer. 12. Ditch the ice cream sundae in favor of a [place] yogurt parfait. Instead of a cup of ice cream or fro-yo for dessert, try 1 cup of nonfat Greek yogurt topped with fresh berries and a sprinkle of cacao nibs. Both toppings are packed with antioxidants, which can help reduce cellular inflammation and oxidative damage. And the comparison is a no-brainer: One cup of ice cream has about 275 calories; one cup of frozen yogurt has about 230; and a cup of Greek yogurt has just 130, plus twice the protein, so you're less likely to return to the freezer for a second helping. 13. Put olive oil in a spray container instead of using it directly from the bottle. Each tablespoon of olive oil is 120 calories and 15g of fat. Use a mister instead of pouring it straight into the pan or onto a salad. This allows for portion control and will save you more than 100 calories. 14. When baking, substitute canned pumpkin for butter or oil. Canned pumpkin-not pumpkin pie mix-is loaded with Vitamin A, which  is important for skin and eye health, as well as immunity. And the comparisons are pretty crazy:  cup of canned pumpkin has about 40 calories, compared to butter or oil, which has more than 800 calories. Yes, 800 calories. Applesauce and mashed banana can also serve as good substitutions for butter or oil, usually in a 1:1 ratio. 15. Top casseroles with high-fiber cereal instead of breadcrumbs. Breadcrumbs are typically made with white bread, while breakfast cereals contain 5-9g of fiber per serving. Not only will you save more than 150 calories per  cup serving, the swap will also keep you more full and you'll get a metabolism boost from the added fiber. 16. Snack on pistachios instead of macadamia nuts. Believe it or not, you get the same amount of calories from 35 pistachios (100 calories) as you would from only five macadamia nuts. 17. Chow down on kale chips rather than potato chips. This is my favorite 'don't knock it 'till you try it' swap. Kale chips are so easy to make at home, and you can spice them up with a little grated parmesan or chili powder. Plus, they're a mere fraction of the calories of potato chips, but with the same crunch factor we crave so often. 18. Add seltzer and some fruit slices to your cocktail instead of soda or fruit juice. One cup of soda or fruit juice can pack on as much as 140 calories. Instead, use seltzer and fruit slices. The fruit provides valuable phytochemicals, such as flavonoids and anthocyanins, which help to combat cancer and stave off the aging process.

## 2018-05-02 ENCOUNTER — Other Ambulatory Visit: Payer: Self-pay

## 2018-05-02 ENCOUNTER — Encounter: Payer: Self-pay | Admitting: Obstetrics and Gynecology

## 2018-05-02 ENCOUNTER — Ambulatory Visit (INDEPENDENT_AMBULATORY_CARE_PROVIDER_SITE_OTHER): Payer: Self-pay | Admitting: Obstetrics and Gynecology

## 2018-05-02 VITALS — BP 148/100 | HR 92 | Resp 16 | Wt 228.0 lb

## 2018-05-02 DIAGNOSIS — D252 Subserosal leiomyoma of uterus: Secondary | ICD-10-CM

## 2018-05-02 NOTE — Progress Notes (Signed)
GYNECOLOGY  VISIT   HPI: 62 y.o.   Married  Caucasian  female   G2P2002 with No LMP recorded. Patient is postmenopausal.   here for follow up on fibroids. She was incidentally noted to have a "large fibroid uterus" on CT scan in 3/19 for hip pain.  At the time of her exam in 3/19 her uterus was slightly enlarged, irregular on the left side, not tender.  The fibroid measured 7.5 x 7.4 x 6.9 cm She denied any symptoms with the fibroid uterus  She denies bleeding. She does have some back pain if she is on her feel all day.  She continues to have OAB and urge incontinence, being managed by her primary.     GYNECOLOGIC HISTORY: No LMP recorded. Patient is postmenopausal. Contraception:postmenopause  Menopausal hormone therapy: none         OB History    Gravida  2   Para  2   Term  2   Preterm      AB      Living  2     SAB      TAB      Ectopic      Multiple      Live Births  2              Patient Active Problem List   Diagnosis Date Noted  . History of non anemic vitamin B12 deficiency 03/13/2018  . Prediabetes 03/13/2018  . History of hypertension 03/13/2018  . 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 Bronchitis 01/29/2018  . Cellulitis 11/14/2017  . Uterine fibroid- noted on CT scan from 3\21\18 07/15/2017  . Degenerative disc disease, lumbar- noted on CT 01-25-17 07/15/2017  . Chronic hip pain- R 03/16/2017  . Urinary incontinence, urge 03/16/2017  . Diverticulosis of sigmoid colon 03/16/2017  . Contusion of right hip- fell 12/22/16 01/02/2017  . Greater trochanteric bursitis of right hip 09/23/2016  . Chronic Right lower extremity edema 07/01/2016  . Chronic foot pain 07/01/2016  . Venous (peripheral) insufficiency- B\L L Ext 07/01/2016  . Chronic pain in right foot 07/01/2016  . Increased vitamin B12 level due to excess supplements 05/17/2016  . Restless legs syndrome (RLS) 05/17/2016  . seasonal or environmental allergies 05/16/2016  . Insomnia  04/18/2016  . Vitamin D deficiency 04/18/2016  . Morbid obesity ([place]) 04/18/2016  . GERD (gastroesophageal reflux disease) 04/18/2016  . hypothyroidism 04/15/2016  . Chronic pain 11/30/2011    Past Medical History:  Diagnosis Date  . Allergy   . Chronic lower back pain   . Fibroid   . Insomnia   . Restless leg syndrome   . Thyroid disease   . Vitamin D deficiency     Past Surgical History:  Procedure Laterality Date  . ELBOW SURGERY Right 1999    Current Outpatient Medications  Medication Sig Dispense Refill  . Acetaminophen-Caffeine (EXCEDRIN ASPIRIN FREE PO) Take by mouth daily as needed.    . Ascorbic Acid (VITAMIN C) 1000 MG tablet Take 2,000 mg by mouth daily.    . Cholecalciferol (VITAMIN D3 SUPER STRENGTH) 2000 units CAPS Take 4,000 Units by mouth daily.    Marland Kitchen levothyroxine (SYNTHROID, LEVOTHROID) 100 MCG tablet Take 1 tablet (100 mcg total) by mouth daily. 30 tablet 0  . MAGNESIUM PO Take 1 tablet by mouth daily.    . SUPER B COMPLEX/C PO Take 1 tablet by mouth daily.    Marland Kitchen UNABLE TO FIND 1 tablet daily. Med Name: Eye Care Vitamin    .  vitamin B-12 (CYANOCOBALAMIN) 500 MCG tablet Take 500 mcg by mouth daily.     No current facility-administered medications for this visit.      ALLERGIES: Patient has no known allergies.  Family History  Problem Relation Age of Onset  . Diabetes Mother   . COPD Mother   . Cancer Mother        lung  . Multiple sclerosis Father   . Heart disease Father   . Cancer Sister        lung  . Stroke Sister   . Cancer Brother        bladder  . Stroke Sister   . Leukemia Sister   . Alcohol abuse Brother   . Healthy Brother   . Healthy Daughter   . Leukemia Brother 5    Social History   Socioeconomic History  . Marital status: Married    Spouse name: Not on file  . Number of children: Not on file  . Years of education: Not on file  . Highest education level: Not on file  Occupational History  . Not on file  Social Needs   . Financial resource strain: Not on file  . Food insecurity:    Worry: Not on file    Inability: Not on file  . Transportation needs:    Medical: Not on file    Non-medical: Not on file  Tobacco Use  . Smoking status: Never Smoker  . Smokeless tobacco: Never Used  Substance and Sexual Activity  . Alcohol use: Yes    Alcohol/week: 3.0 oz    Types: 5 Shots of liquor per week  . Drug use: No  . Sexual activity: Yes    Partners: Male    Birth control/protection: Post-menopausal  Lifestyle  . Physical activity:    Days per week: Not on file    Minutes per session: Not on file  . Stress: Not on file  Relationships  . Social connections:    Talks on phone: Not on file    Gets together: Not on file    Attends religious service: Not on file    Active member of club or organization: Not on file    Attends meetings of clubs or organizations: Not on file    Relationship status: Not on file  . Intimate partner violence:    Fear of current or ex partner: Not on file    Emotionally abused: Not on file    Physically abused: Not on file    Forced sexual activity: Not on file  Other Topics Concern  . Not on file  Social History Narrative  . Not on file    Review of Systems  Constitutional: Negative.   HENT: Negative.   Eyes: Negative.   Respiratory: Negative.   Cardiovascular: Negative.   Gastrointestinal: Negative.   Genitourinary: Positive for frequency and urgency.  Musculoskeletal: Negative.   Skin: Negative.   Neurological: Negative.   Endo/Heme/Allergies: Negative.   Psychiatric/Behavioral: Negative.     PHYSICAL EXAMINATION:    BP (!) 148/100 (BP Location: Right Arm, Patient Position: Sitting, Cuff Size: Normal)   Pulse 92   Resp 16   Wt 228 lb (103.4 kg)   BMI 40.39 kg/m     General appearance: alert, cooperative and appears stated age  Pelvic: External genitalia:  no lesions              Urethra:  normal appearing urethra with no masses, tenderness or  lesions  Bartholins and Skenes: normal                 Cervix: no cervical motion tenderness              Bimanual Exam:  Uterus:  anteverted, mobile, normal sized with fibroid palpated off of the left side of her uterus, feels about 5 cm (radiology report 7 cm). Not tender              Adnexa: no mass, fullness, tenderness              Rectovaginal: Yes.  .  Confirms.              Anus:  normal sphincter tone, no lesions  Chaperone was present for exam.  ASSESSMENT Incidental finding of a fibroid uterus in a PMP woman. Not very large, stable in size. She has issues with OAB and back pain, I don't think her symptoms are caused by or worsened with her small fibroid.     PLAN She is due for a pap in 3/20, recommend repeat pelvic at that time Otherwise f/u as needed   An After Visit Summary was printed and given to the patient.  CC: Dr Raliegh Scarlet

## 2018-05-03 ENCOUNTER — Ambulatory Visit: Payer: Self-pay | Admitting: Family Medicine

## 2018-06-26 ENCOUNTER — Other Ambulatory Visit: Payer: Self-pay | Admitting: Family Medicine

## 2018-06-26 DIAGNOSIS — E079 Disorder of thyroid, unspecified: Secondary | ICD-10-CM

## 2018-06-28 ENCOUNTER — Other Ambulatory Visit: Payer: Self-pay | Admitting: Family Medicine

## 2018-06-28 DIAGNOSIS — N3941 Urge incontinence: Secondary | ICD-10-CM

## 2018-12-25 ENCOUNTER — Encounter: Payer: Self-pay | Admitting: Family Medicine

## 2018-12-25 ENCOUNTER — Other Ambulatory Visit (HOSPITAL_COMMUNITY)
Admission: RE | Admit: 2018-12-25 | Discharge: 2018-12-25 | Disposition: A | Discharge status: Home / Self Care | Payer: Self-pay | Source: Ambulatory Visit | Attending: Family Medicine | Admitting: Family Medicine

## 2018-12-25 ENCOUNTER — Ambulatory Visit (INDEPENDENT_AMBULATORY_CARE_PROVIDER_SITE_OTHER): Payer: Self-pay | Admitting: Family Medicine

## 2018-12-25 DIAGNOSIS — R32 Unspecified urinary incontinence: Secondary | ICD-10-CM | POA: Insufficient documentation

## 2018-12-25 DIAGNOSIS — R7303 Prediabetes: Secondary | ICD-10-CM

## 2018-12-25 DIAGNOSIS — Z124 Encounter for screening for malignant neoplasm of cervix: Secondary | ICD-10-CM

## 2018-12-25 DIAGNOSIS — E785 Hyperlipidemia, unspecified: Secondary | ICD-10-CM | POA: Insufficient documentation

## 2018-12-25 DIAGNOSIS — E079 Disorder of thyroid, unspecified: Secondary | ICD-10-CM

## 2018-12-25 DIAGNOSIS — E7841 Elevated Lipoprotein(a): Secondary | ICD-10-CM

## 2018-12-25 MED ORDER — MIRABEGRON ER 25 MG PO TB24: 25.0000 mg | ORAL_TABLET | Freq: Every day | ORAL | 1 refills | Status: DC

## 2018-12-25 NOTE — Progress Notes (Signed)
Impression and Recommendations:    1. M.O. ([place])   2. Pap smear for cervical cancer screening   3. Urinary incontinence in female   4. Prediabetes   5. hypothyroidism   6. Hyperlipidemia, unspecified hyperlipidemia type   7. Elevated lipoprotein(a)     - Lab work obtained today PRN (prediabetes, hypothyroidism, hyperlipidemia).  1. Screening for Cervical Cancer - Pap Smear - Pap smear obtained today.  - Patient's last pap smear on record was in 2017.  - Education about pap smear and other health screening recommendations provided today.  2. Whitish Discharge in Genital Region - Educated patient about possible causes for her whitish discharge.   - Advised patient to clean her genital region using unscented soap without perfumes or dyes, such as Dove for sensitive skin.  3. Frequent Urination & Urinary Incontinence - Education provided to patient today about urinary incontinence.  All questions were answered.  - Patient was historically treated on Detrol LA, but notes this prescription was rather expensive. - Prescription for [facility] written today as an alternative.  - In the meantime, advised patient to use GoodRx to search for generic urinary incontinence medicines, which may be more affordable to her.  - Encouraged patient to utilize tools such as Ben-Wa balls to help improve her pelvic floor strength.   4. Use of OTC Sleep Aid - Advised patient to utilize melatonin instead of benadryl-based products.  - Encouraged patient to look up sleep meditations on youtube.  5. Hyperlipidemia - Lab work obtained today.  - The 10-year ASCVD risk score Mikey Bussing DC Brooke Bonito., et al., 2013) is: 9.5%   Values used to calculate the score:     Age: 63 years     Sex: Female     Is Non-Hispanic African American: No     Diabetic: Yes     Tobacco smoker: No     Systolic Blood Pressure: 341 mmHg     Is BP treated: No     HDL Cholesterol: 53 mg/dL     Total Cholesterol: 188  mg/dL  - Will continue to monitor with further evaluation PRN.  6. Hypothyroidism - Lab work obtained today.  - Will continue to monitor with further evaluation PRN.  7. BMI Counseling Explained to patient what BMI refers to, and what it means medically.    Told patient to think about it as a "medical risk stratification measurement" and how increasing BMI is associated with increasing risk/ or worsening state of various diseases such as hypertension, hyperlipidemia, diabetes, premature OA, depression etc.  American Heart Association guidelines for healthy diet, basically Mediterranean diet, and exercise guidelines of 30 minutes 5 days per week or more discussed in detail.  Health counseling performed.  All questions answered.  8. Lifestyle & Preventative Health Maintenance - Advised patient to continue working toward exercising to improve overall mental, physical, and emotional health.    - Reviewed the "spokes of the wheel" of mood and health management, especially as patient is now retired.  Stressed the importance of ongoing prudent habits, including regular exercise, appropriate sleep hygiene, healthful dietary habits, and prayer/meditation to relax.  - Encouraged patient to engage in daily physical activity, especially a formal exercise routine.  Recommended that the patient eventually strive for at least 150 minutes of moderate cardiovascular activity per week according to guidelines established by the [facility].   - Healthy dietary habits encouraged, including low-carb, and high amounts of lean protein in diet.   - Patient  should also consume adequate amounts of water.   Orders Placed This Encounter  Procedures  . TSH  . T4, free  . Hemoglobin A1c  . Lipid panel    Meds ordered this encounter  Medications  . DISCONTD: mirabegron ER (MYRBETRIQ) 25 MG TB24 tablet    Sig: Take 1 tablet (25 mg total) by mouth daily.    Dispense:  90 tablet    Refill:  1    Please use discount  card  . mirabegron ER (MYRBETRIQ) 25 MG TB24 tablet    Sig: Take 1 tablet (25 mg total) by mouth daily.    Dispense:  90 tablet    Refill:  1    Please use discount card    Medications Discontinued During This Encounter  Medication Reason  . Cholecalciferol (VITAMIN D3 SUPER STRENGTH) 2000 units CAPS Patient Preference  . vitamin B-12 (CYANOCOBALAMIN) 500 MCG tablet Patient Preference  . UNABLE TO FIND Patient Preference  . DULoxetine (CYMBALTA) 30 MG capsule No longer needed (for PRN medications)  . mirabegron ER (MYRBETRIQ) 25 MG TB24 tablet      Gross side effects, risk and benefits, and alternatives of medications and treatment plan in general discussed with patient.  Patient is aware that all medications have potential side effects and we are unable to predict every side effect or drug-drug interaction that may occur.   Patient will call with any questions prior to using medication if they have concerns.    Expresses verbal understanding and consents to current therapy and treatment regimen.  No barriers to understanding were identified.  Red flag symptoms and signs discussed in detail.  Patient expressed understanding regarding what to do in case of emergency\urgent symptoms  Please see AVS handed out to patient at the end of our visit for further patient instructions/ counseling done pertaining to today's office visit.   Return for f/up 53mo or sooner if issues.     Note:  This note was prepared with assistance of Dragon voice recognition software. Occasional wrong-word or sound-a-like substitutions may have occurred due to the inherent limitations of voice recognition software.   This document serves as a record of services personally performed by [name], DO. It was created on her behalf by Toni Amend, a trained medical scribe. The creation of this record is based on the scribe's personal observations and the provider's statements to them.   I have reviewed the  above medical documentation for accuracy and completeness and I concur.  [name], DO 12/25/2018 8:50 PM       ----------------------------------------------------------------------------------------------------------------------------    Subjective:     HPI: Dawn Alexander is a 63 y.o. female who presents to [place] [facility] today for issues as discussed below.  Notes she's been good overall.  In general, feeling well.  Denies walking or exercising.  Comments she's mainly been "bored."  Genitalia Patient notes she cleans herself every day with [employer].  Denies feeling like she smells anything unusual down there.  Frequent Urination - Historically Treated on Detrol LA Notes she wears a pad all the time.  Took the Detrol LA.  Notes "it works for two weeks," and experienced nausea and S-E from it, but after two weeks "it doesn't do anything."  Sleep Aid use Takes equate sleeping pills.   Wt Readings from Last 3 Encounters:  12/25/18 226 lb 9.6 oz (102.8 kg)  05/02/18 228 lb (103.4 kg)  03/13/18 230 lb (104.3  kg)   BP Readings from Last 3 Encounters:  12/25/18 129/81  05/02/18 (!) 148/100  03/13/18 124/74   Pulse Readings from Last 3 Encounters:  12/25/18 74  05/02/18 92  03/13/18 67   BMI Readings from Last 3 Encounters:  12/25/18 40.14 kg/m  05/02/18 40.39 kg/m  03/13/18 40.74 kg/m     Patient Care Team    Relationship Specialty Notifications Start End  [name], DO PCP - General Family Medicine  04/15/16   Salvadore Dom, MD Consulting Physician Obstetrics and Gynecology  03/16/17      Patient Active Problem List   Diagnosis Date Noted  . Prediabetes 03/13/2018    Priority: High  . Elevated lipoprotein(a) 03/13/2018    Priority: High  . Morbid obesity ([place]) 04/18/2016    Priority: High  . History of hypertension 03/13/2018    Priority: Medium  . Uterine fibroid- noted on CT scan from  3\21\18 07/15/2017    Priority: Medium  . Degenerative disc disease, lumbar- noted on CT 01-25-17 07/15/2017    Priority: Medium  . Venous (peripheral) insufficiency- B\L L Ext 07/01/2016    Priority: Medium  . Restless legs syndrome (RLS) 05/17/2016    Priority: Medium  . Insomnia 04/18/2016    Priority: Medium  . GERD (gastroesophageal reflux disease) 04/18/2016    Priority: Medium  . hypothyroidism 04/15/2016    Priority: Medium  . Urinary incontinence, urge 03/16/2017    Priority: Low  . Greater trochanteric bursitis of right hip 09/23/2016    Priority: Low  . Vitamin D deficiency 04/18/2016    Priority: Low  . Urinary incontinence in female 12/25/2018  . Hyperlipidemia 12/25/2018  . History of non anemic vitamin B12 deficiency 03/13/2018  . Bronchitis 01/29/2018  . Cellulitis 11/14/2017  . Chronic hip pain- R 03/16/2017  . Diverticulosis of sigmoid colon 03/16/2017  . Contusion of right hip- fell 12/22/16 01/02/2017  . Chronic Right lower extremity edema 07/01/2016  . Chronic foot pain 07/01/2016  . Chronic pain in right foot 07/01/2016  . Increased vitamin B12 level due to excess supplements 05/17/2016  . seasonal or environmental allergies 05/16/2016  . Chronic pain 11/30/2011    Past Medical history, Surgical history, Family history, Social history, Allergies and Medications have been entered into the medical record, reviewed and changed as needed.    Current Meds  Medication Sig  . Acetaminophen-Caffeine (EXCEDRIN ASPIRIN FREE PO) Take by mouth daily as needed.  . Ascorbic Acid (VITAMIN C) 1000 MG tablet Take 2,000 mg by mouth daily.  . Cholecalciferol (VITAMIN D3) 125 MCG (5000 UT) CAPS Take 1 capsule by mouth daily.  Marland Kitchen levothyroxine (SYNTHROID, LEVOTHROID) 100 MCG tablet TAKE 1 TABLET BY MOUTH DAILY.  Marland Kitchen MAGNESIUM PO Take 1 tablet by mouth daily.  . SUPER B COMPLEX/C PO Take 1 tablet by mouth daily.  . vitamin B-12 (CYANOCOBALAMIN) 500 MCG tablet Take 500 mcg by  mouth daily.    Allergies:  No Known Allergies   Review of Systems:  A fourteen system review of systems was performed and found to be positive as per HPI.   Objective:   Blood pressure 129/81, pulse 74, temperature 98.2 F (36.8 C), height 5\' 3"  (1.6 m), weight 226 lb 9.6 oz (102.8 kg), SpO2 99 %. Body mass index is 40.14 kg/m. General:  Well Developed, well nourished, appropriate for stated age.  Neuro:  Alert and oriented,  extra-ocular muscles intact  HEENT:  Normocephalic, atraumatic, neck supple, no carotid bruits appreciated  Skin:  no gross rash, warm, pink. Cardiac:  RRR, S1 S2 Respiratory:  ECTA B/L and A/P, Not using accessory muscles, speaking in full sentences- unlabored. Vascular:  Ext warm, no cyanosis apprec.; cap RF less 2 sec. Psych:  No HI/SI, judgement and insight good, Euthymic mood. Full Affect. Genitalia: Bimanual exam is technically challenging due to body habitus.  Ext genitalia: without lesion, no rash.  Slight whitish discharge, No tenderness;  Cervix: WNL's w/o discharge or lesion; Adnexa:  No tenderness or palpable masses

## 2018-12-25 NOTE — Patient Instructions (Signed)

## 2018-12-26 ENCOUNTER — Other Ambulatory Visit: Payer: Self-pay | Admitting: Family Medicine

## 2018-12-26 LAB — LIPID PANEL
Chol/HDL Ratio: 3.4 ratio (ref 0.0–4.4)
Cholesterol, Total: 203 mg/dL — ABNORMAL HIGH (ref 100–199)
HDL: 60 mg/dL (ref >39)
LDL Calculated: 123 mg/dL — ABNORMAL HIGH (ref 0–99)
Triglycerides: 98 mg/dL (ref 0–149)
VLDL Cholesterol Cal: 20 mg/dL (ref 5–40)

## 2018-12-26 LAB — HEMOGLOBIN A1C
Est. average glucose Bld gHb Est-mCnc: 120 mg/dL
Hgb A1c MFr Bld: 5.8 % — ABNORMAL HIGH (ref 4.8–5.6)

## 2018-12-26 LAB — TSH: TSH: 4.33 u[IU]/mL (ref 0.450–4.500)

## 2018-12-26 LAB — T4, FREE: Free T4: 1.13 ng/dL (ref 0.82–1.77)

## 2018-12-26 MED ORDER — SOLIFENACIN SUCCINATE 5 MG PO TABS: 5.0000 mg | ORAL_TABLET | Freq: Every day | ORAL | 0 refills | Status: DC

## 2018-12-28 LAB — CYTOLOGY - PAP
Bacterial vaginitis: NEGATIVE
CANDIDA VAGINITIS: POSITIVE — AB
Diagnosis: NEGATIVE
HPV (WINDOPATH): NOT DETECTED

## 2019-01-02 ENCOUNTER — Other Ambulatory Visit: Payer: Self-pay

## 2019-01-02 MED ORDER — FLUCONAZOLE 150 MG PO TABS: 150.0000 mg | ORAL_TABLET | Freq: Once | ORAL | 0 refills | Status: AC

## 2019-01-21 ENCOUNTER — Other Ambulatory Visit: Payer: Self-pay | Admitting: Family Medicine

## 2019-01-21 DIAGNOSIS — E079 Disorder of thyroid, unspecified: Secondary | ICD-10-CM

## 2019-03-22 ENCOUNTER — Other Ambulatory Visit: Payer: Self-pay | Admitting: Family Medicine

## 2019-04-23 ENCOUNTER — Other Ambulatory Visit: Payer: Self-pay | Admitting: Family Medicine

## 2019-04-23 DIAGNOSIS — E079 Disorder of thyroid, unspecified: Secondary | ICD-10-CM

## 2019-06-12 LAB — HM MAMMOGRAPHY

## 2019-07-05 ENCOUNTER — Other Ambulatory Visit: Payer: Self-pay | Admitting: Family Medicine

## 2019-07-05 DIAGNOSIS — R7303 Prediabetes: Secondary | ICD-10-CM

## 2019-07-05 DIAGNOSIS — E785 Hyperlipidemia, unspecified: Secondary | ICD-10-CM

## 2019-07-05 DIAGNOSIS — Z8679 Personal history of other diseases of the circulatory system: Secondary | ICD-10-CM

## 2019-07-05 DIAGNOSIS — E559 Vitamin D deficiency, unspecified: Secondary | ICD-10-CM

## 2019-07-05 NOTE — Telephone Encounter (Signed)
Please call pt to schedule the below 2 appts.   Then please forward to cma for lab orders. Thanks team!    Good [name]!  You are due for blood work- an A1c, a cholesterol panel, a CMP (which checks your kidney and liver function)  as well as vitamin D level.    The latter 2 have not been checked in 2 years and for your prediabetes and cholesterol- last check was over 6 months ago.  So, please make sure you call the office and make a lab-only appt for these 4 labs in the near future.    Then, I recommend a telehealth visit which we can do either via live video or telephone office visit 3 to 5 days after the labs.  This will be to see how you are doing- to discuss your chronic conditions as well as go over the blood work.  If you have any questions, please let me know.   Dr Jenetta Downer

## 2019-07-08 NOTE — Telephone Encounter (Signed)
Lab orders in.  Please call pt to schedule appts (See Dr. Hershal Coria message).  Thanks!  Charyl Bigger, CMA

## 2019-07-08 NOTE — Addendum Note (Signed)
Addended by: Fonnie Mu on: 07/08/2019 11:41 AM   Modules accepted: Orders

## 2019-07-22 ENCOUNTER — Other Ambulatory Visit: Payer: Self-pay

## 2019-07-22 DIAGNOSIS — E079 Disorder of thyroid, unspecified: Secondary | ICD-10-CM

## 2019-07-22 MED ORDER — LEVOTHYROXINE SODIUM 100 MCG PO TABS: 100.0000 ug | ORAL_TABLET | Freq: Every day | ORAL | 1 refills | Status: DC

## 2019-07-29 ENCOUNTER — Other Ambulatory Visit: Payer: Self-pay | Admitting: Family Medicine

## 2019-07-29 DIAGNOSIS — E079 Disorder of thyroid, unspecified: Secondary | ICD-10-CM

## 2019-08-14 ENCOUNTER — Ambulatory Visit (INDEPENDENT_AMBULATORY_CARE_PROVIDER_SITE_OTHER): Payer: Medicare HMO | Admitting: Family Medicine

## 2019-08-14 ENCOUNTER — Encounter: Payer: Self-pay | Admitting: Family Medicine

## 2019-08-14 ENCOUNTER — Other Ambulatory Visit: Payer: Self-pay

## 2019-08-14 VITALS — BP 142/82 | HR 95 | Ht 63.5 in | Wt 241.0 lb

## 2019-08-14 DIAGNOSIS — E785 Hyperlipidemia, unspecified: Secondary | ICD-10-CM

## 2019-08-14 DIAGNOSIS — R32 Unspecified urinary incontinence: Secondary | ICD-10-CM

## 2019-08-14 DIAGNOSIS — R7303 Prediabetes: Secondary | ICD-10-CM

## 2019-08-14 DIAGNOSIS — Z0000 Encounter for general adult medical examination without abnormal findings: Secondary | ICD-10-CM

## 2019-08-14 DIAGNOSIS — Z23 Encounter for immunization: Secondary | ICD-10-CM

## 2019-08-14 DIAGNOSIS — E119 Type 2 diabetes mellitus without complications: Secondary | ICD-10-CM | POA: Diagnosis not present

## 2019-08-14 DIAGNOSIS — E079 Disorder of thyroid, unspecified: Secondary | ICD-10-CM | POA: Diagnosis not present

## 2019-08-14 DIAGNOSIS — E559 Vitamin D deficiency, unspecified: Secondary | ICD-10-CM

## 2019-08-14 DIAGNOSIS — Z6841 Body Mass Index (BMI) 40.0 and over, adult: Secondary | ICD-10-CM | POA: Diagnosis not present

## 2019-08-14 DIAGNOSIS — E7841 Elevated Lipoprotein(a): Secondary | ICD-10-CM | POA: Diagnosis not present

## 2019-08-14 DIAGNOSIS — Z1211 Encounter for screening for malignant neoplasm of colon: Secondary | ICD-10-CM

## 2019-08-14 MED ORDER — MIRABEGRON ER 25 MG PO TB24: 25.0000 mg | ORAL_TABLET | Freq: Every day | ORAL | 1 refills | Status: DC

## 2019-08-14 NOTE — Patient Instructions (Signed)
Preventive Care for Adults, Female  A healthy lifestyle and preventive care can promote health and wellness. Preventive health guidelines for women include the following key practices.   A routine yearly physical is a good way to check with your health care provider about your health and preventive screening. It is a chance to share any concerns and updates on your health and to receive a thorough exam.   Visit your dentist for a routine exam and preventive care every 6 months. Brush your teeth twice a day and floss once a day. Good oral hygiene prevents tooth decay and gum disease.   The frequency of eye exams is based on your age, health, family medical history, use of contact lenses, and other factors. Follow your health care provider's recommendations for frequency of eye exams.   Eat a healthy diet. Foods like vegetables, fruits, whole grains, low-fat dairy products, and lean protein foods contain the nutrients you need without too many calories. Decrease your intake of foods high in solid fats, added sugars, and salt. Eat the right amount of calories for you.Get information about a proper diet from your health care provider, if necessary.   Regular physical exercise is one of the most important things you can do for your health. Most adults should get at least 150 minutes of moderate-intensity exercise (any activity that increases your heart rate and causes you to sweat) each week. In addition, most adults need muscle-strengthening exercises on 2 or more days a week.   Maintain a healthy weight. The body mass index (BMI) is a screening tool to identify possible weight problems. It provides an estimate of body fat based on height and weight. Your health care provider can find your BMI, and can help you achieve or maintain a healthy weight.For adults 20 years and older:   - A BMI below 18.5 is considered underweight.   - A BMI of 18.5 to 24.9 is normal.   - A BMI of 25 to 29.9 is  considered overweight.   - A BMI of 30 and above is considered obese.   Maintain normal blood lipids and cholesterol levels by exercising and minimizing your intake of trans and saturated fats.  Eat a balanced diet with plenty of fruit and vegetables. Blood tests for lipids and cholesterol should begin at age 83 and be repeated every 5 years minimum.  If your lipid or cholesterol levels are high, you are over 40, or you are at high risk for heart disease, you may need your cholesterol levels checked more frequently.Ongoing high lipid and cholesterol levels should be treated with medicines if diet and exercise are not working.   If you smoke, find out from your health care provider how to quit. If you do not use tobacco, do not start.   Lung cancer screening is recommended for adults aged 58-80 years who are at high risk for developing lung cancer because of a history of smoking. A yearly low-dose CT scan of the lungs is recommended for people who have at least a 30-pack-year history of smoking and are a current smoker or have quit within the past 15 years. A pack year of smoking is smoking an average of 1 pack of cigarettes a day for 1 year (for example: 1 pack a day for 30 years or 2 packs a day for 15 years). Yearly screening should continue until the smoker has stopped smoking for at least 15 years. Yearly screening should be stopped for people who develop a  health problem that would prevent them from having lung cancer treatment.   If you are pregnant, do not drink alcohol. If you are breastfeeding, be very cautious about drinking alcohol. If you are not pregnant and choose to drink alcohol, do not have more than 1 drink per day. One drink is considered to be 12 ounces (355 mL) of beer, 5 ounces (148 mL) of wine, or 1.5 ounces (44 mL) of liquor.   Avoid use of street drugs. Do not share needles with anyone. Ask for help if you need support or instructions about stopping the use of  drugs.   High blood pressure causes heart disease and increases the risk of stroke. Your blood pressure should be checked at least yearly.  Ongoing high blood pressure should be treated with medicines if weight loss and exercise do not work.   If you are 69-55 years old, ask your health care provider if you should take aspirin to prevent strokes.   Diabetes screening involves taking a blood sample to check your fasting blood sugar level. This should be done once every 3 years, after age 38, if you are within normal weight and without risk factors for diabetes. Testing should be considered at a younger age or be carried out more frequently if you are overweight and have at least 1 risk factor for diabetes.   Breast cancer screening is essential preventive care for women. You should practice "breast self-awareness."  This means understanding the normal appearance and feel of your breasts and may include breast self-examination.  Any changes detected, no matter how small, should be reported to a health care provider.  Women in their 80s and 30s should have a clinical breast exam (CBE) by a health care provider as part of a regular health exam every 1 to 3 years.  After age 66, women should have a CBE every year.  Starting at age 1, women should consider having a mammogram (breast X-ray test) every year.  Women who have a family history of breast cancer should talk to their health care provider about genetic screening.  Women at a high risk of breast cancer should talk to their health care providers about having an MRI and a mammogram every year.   -Breast cancer gene (BRCA)-related cancer risk assessment is recommended for women who have family members with BRCA-related cancers. BRCA-related cancers include breast, ovarian, tubal, and peritoneal cancers. Having family members with these cancers may be associated with an increased risk for harmful changes (mutations) in the breast cancer genes BRCA1 and  BRCA2. Results of the assessment will determine the need for genetic counseling and BRCA1 and BRCA2 testing.   The Pap test is a screening test for cervical cancer. A Pap test can show cell changes on the cervix that might become cervical cancer if left untreated. A Pap test is a procedure in which cells are obtained and examined from the lower end of the uterus (cervix).   - Women should have a Pap test starting at age 57.   - Between ages 90 and 70, Pap tests should be repeated every 2 years.   - Beginning at age 63, you should have a Pap test every 3 years as long as the past 3 Pap tests have been normal.   - Some women have medical problems that increase the chance of getting cervical cancer. Talk to your health care provider about these problems. It is especially important to talk to your health care provider if a  new problem develops soon after your last Pap test. In these cases, your health care provider may recommend more frequent screening and Pap tests.   - The above recommendations are the same for women who have or have not gotten the vaccine for human papillomavirus (HPV).   - If you had a hysterectomy for a problem that was not cancer or a condition that could lead to cancer, then you no longer need Pap tests. Even if you no longer need a Pap test, a regular exam is a good idea to make sure no other problems are starting.   - If you are between ages 36 and 66 years, and you have had normal Pap tests going back 10 years, you no longer need Pap tests. Even if you no longer need a Pap test, a regular exam is a good idea to make sure no other problems are starting.   - If you have had past treatment for cervical cancer or a condition that could lead to cancer, you need Pap tests and screening for cancer for at least 20 years after your treatment.   - If Pap tests have been discontinued, risk factors (such as a new sexual partner) need to be reassessed to determine if screening should  be resumed.   - The HPV test is an additional test that may be used for cervical cancer screening. The HPV test looks for the virus that can cause the cell changes on the cervix. The cells collected during the Pap test can be tested for HPV. The HPV test could be used to screen women aged 70 years and older, and should be used in women of any age who have unclear Pap test results. After the age of 67, women should have HPV testing at the same frequency as a Pap test.   Colorectal cancer can be detected and often prevented. Most routine colorectal cancer screening begins at the age of 57 years and continues through age 26 years. However, your health care provider may recommend screening at an earlier age if you have risk factors for colon cancer. On a yearly basis, your health care provider may provide home test kits to check for hidden blood in the stool.  Use of a small camera at the end of a tube, to directly examine the colon (sigmoidoscopy or colonoscopy), can detect the earliest forms of colorectal cancer. Talk to your health care provider about this at age 23, when routine screening begins. Direct exam of the colon should be repeated every 5 -10 years through age 49 years, unless early forms of pre-cancerous polyps or small growths are found.   People who are at an increased risk for hepatitis B should be screened for this virus. You are considered at high risk for hepatitis B if:  -You were born in a country where hepatitis B occurs often. Talk with your health care provider about which countries are considered high risk.  - Your parents were born in a high-risk country and you have not received a shot to protect against hepatitis B (hepatitis B vaccine).  - You have HIV or AIDS.  - You use needles to inject street drugs.  - You live with, or have sex with, someone who has Hepatitis B.  - You get hemodialysis treatment.  - You take certain medicines for conditions like cancer, organ  transplantation, and autoimmune conditions.   Hepatitis C blood testing is recommended for all people born from [birth year] through 1965 and any individual  with known risks for hepatitis C.   Practice safe sex. Use condoms and avoid high-risk sexual practices to reduce the spread of sexually transmitted infections (STIs). STIs include gonorrhea, chlamydia, syphilis, trichomonas, herpes, HPV, and human immunodeficiency virus (HIV). Herpes, HIV, and HPV are viral illnesses that have no cure. They can result in disability, cancer, and death. Sexually active women aged 25 years and younger should be checked for chlamydia. Older women with new or multiple partners should also be tested for chlamydia. Testing for other STIs is recommended if you are sexually active and at increased risk.   Osteoporosis is a disease in which the bones lose minerals and strength with aging. This can result in serious bone fractures or breaks. The risk of osteoporosis can be identified using a bone density scan. Women ages 65 years and over and women at risk for fractures or osteoporosis should discuss screening with their health care providers. Ask your health care provider whether you should take a calcium supplement or vitamin D to There are also several preventive steps women can take to avoid osteoporosis and resulting fractures or to keep osteoporosis from worsening. -->Recommendations include:  Eat a balanced diet high in fruits, vegetables, calcium, and vitamins.  Get enough calcium. The recommended total intake of is 1,200 mg daily; for best absorption, if taking supplements, divide doses into 250-500 mg doses throughout the day. Of the two types of calcium, calcium carbonate is best absorbed when taken with food but calcium citrate can be taken on an empty stomach.  Get enough vitamin D. NAMS and the National Osteoporosis Foundation recommend at least 1,000 IU per day for women age 50 and over who are at risk of vitamin D  deficiency. Vitamin D deficiency can be caused by inadequate sun exposure (for example, those who live in northern latitudes).  Avoid alcohol and smoking. Heavy alcohol intake (more than 7 drinks per week) increases the risk of falls and hip fracture and women smokers tend to lose bone more rapidly and have lower bone mass than nonsmokers. Stopping smoking is one of the most important changes women can make to improve their health and decrease risk for disease.  Be physically active every day. Weight-bearing exercise (for example, fast walking, hiking, jogging, and weight training) may strengthen bones or slow the rate of bone loss that comes with aging. Balancing and muscle-strengthening exercises can reduce the risk of falling and fracture.  Consider therapeutic medications. Currently, several types of effective drugs are available. Healthcare providers can recommend the type most appropriate for each woman.  Eliminate environmental factors that may contribute to accidents. Falls cause nearly 90% of all osteoporotic fractures, so reducing this risk is an important bone-health strategy. Measures include ample lighting, removing obstructions to walking, using nonskid rugs on floors, and placing mats and/or grab bars in showers.  Be aware of medication side effects. Some common medicines make bones weaker. These include a type of steroid drug called glucocorticoids used for arthritis and asthma, some antiseizure drugs, certain sleeping pills, treatments for endometriosis, and some cancer drugs. An overactive thyroid gland or using too much thyroid hormone for an underactive thyroid can also be a problem. If you are taking these medicines, talk to your doctor about what you can do to help protect your bones.reduce the rate of osteoporosis.    Menopause can be associated with physical symptoms and risks. Hormone replacement therapy is available to decrease symptoms and risks. You should talk to your  health care provider   about whether hormone replacement therapy is right for you.   Use sunscreen. Apply sunscreen liberally and repeatedly throughout the day. You should seek shade when your shadow is shorter than you. Protect yourself by wearing long sleeves, pants, a wide-brimmed hat, and sunglasses year round, whenever you are outdoors.   Once a month, do a whole body skin exam, using a mirror to look at the skin on your back. Tell your health care provider of new moles, moles that have irregular borders, moles that are larger than a pencil eraser, or moles that have changed in shape or color.   -Stay current with required vaccines (immunizations).   Influenza vaccine. All adults should be immunized every year.  Tetanus, diphtheria, and acellular pertussis (Td, Tdap) vaccine. Pregnant women should receive 1 dose of Tdap vaccine during each pregnancy. The dose should be obtained regardless of the length of time since the last dose. Immunization is preferred during the 27th 36th week of gestation. An adult who has not previously received Tdap or who does not know her vaccine status should receive 1 dose of Tdap. This initial dose should be followed by tetanus and diphtheria toxoids (Td) booster doses every 10 years. Adults with an unknown or incomplete history of completing a 3-dose immunization series with Td-containing vaccines should begin or complete a primary immunization series including a Tdap dose. Adults should receive a Td booster every 10 years.  Varicella vaccine. An adult without evidence of immunity to varicella should receive 2 doses or a second dose if she has previously received 1 dose. Pregnant females who do not have evidence of immunity should receive the first dose after pregnancy. This first dose should be obtained before leaving the health care facility. The second dose should be obtained 4 8 weeks after the first dose.  Human papillomavirus (HPV) vaccine. Females aged 13 26  years who have not received the vaccine previously should obtain the 3-dose series. The vaccine is not recommended for use in pregnant females. However, pregnancy testing is not needed before receiving a dose. If a female is found to be pregnant after receiving a dose, no treatment is needed. In that case, the remaining doses should be delayed until after the pregnancy. Immunization is recommended for any person with an immunocompromised condition through the age of 26 years if she did not get any or all doses earlier. During the 3-dose series, the second dose should be obtained 4 8 weeks after the first dose. The third dose should be obtained 24 weeks after the first dose and 16 weeks after the second dose.  Zoster vaccine. One dose is recommended for adults aged 60 years or older unless certain conditions are present.  Measles, mumps, and rubella (MMR) vaccine. Adults born before 1957 generally are considered immune to measles and mumps. Adults born in 1957 or later should have 1 or more doses of MMR vaccine unless there is a contraindication to the vaccine or there is laboratory evidence of immunity to each of the three diseases. A routine second dose of MMR vaccine should be obtained at least 28 days after the first dose for students attending postsecondary schools, health care workers, or international travelers. People who received inactivated measles vaccine or an unknown type of measles vaccine during 1963 1967 should receive 2 doses of MMR vaccine. People who received inactivated mumps vaccine or an unknown type of mumps vaccine before 1979 and are at high risk for mumps infection should consider immunization with 2 doses of   MMR vaccine. For females of childbearing age, rubella immunity should be determined. If there is no evidence of immunity, females who are not pregnant should be vaccinated. If there is no evidence of immunity, females who are pregnant should delay immunization until after pregnancy.  Unvaccinated health care workers born before [birth year] who lack laboratory evidence of measles, mumps, or rubella immunity or laboratory confirmation of disease should consider measles and mumps immunization with 2 doses of MMR vaccine or rubella immunization with 1 dose of MMR vaccine.  Pneumococcal 13-valent conjugate (PCV13) vaccine. When indicated, a person who is uncertain of her immunization history and has no record of immunization should receive the PCV13 vaccine. An adult aged 54 years or older who has certain medical conditions and has not been previously immunized should receive 1 dose of PCV13 vaccine. This PCV13 should be followed with a dose of pneumococcal polysaccharide (PPSV23) vaccine. The PPSV23 vaccine dose should be obtained at least 8 weeks after the dose of PCV13 vaccine. An adult aged 58 years or older who has certain medical conditions and previously received 1 or more doses of PPSV23 vaccine should receive 1 dose of PCV13. The PCV13 vaccine dose should be obtained 1 or more years after the last PPSV23 vaccine dose.  Pneumococcal polysaccharide (PPSV23) vaccine. When PCV13 is also indicated, PCV13 should be obtained first. All adults aged 58 years and older should be immunized. An adult younger than age 65 years who has certain medical conditions should be immunized. Any person who resides in a nursing home or long-term care facility should be immunized. An adult smoker should be immunized. People with an immunocompromised condition and certain other conditions should receive both PCV13 and PPSV23 vaccines. People with human immunodeficiency virus (HIV) infection should be immunized as soon as possible after diagnosis. Immunization during chemotherapy or radiation therapy should be avoided. Routine use of PPSV23 vaccine is not recommended for American Indians, [place] Natives, or people younger than 65 years unless there are medical conditions that require PPSV23 vaccine. When indicated,  people who have unknown immunization and have no record of immunization should receive PPSV23 vaccine. One-time revaccination 5 years after the first dose of PPSV23 is recommended for people aged 70 64 years who have chronic kidney failure, nephrotic syndrome, asplenia, or immunocompromised conditions. People who received 1 2 doses of PPSV23 before age 32 years should receive another dose of PPSV23 vaccine at age 96 years or later if at least 5 years have passed since the previous dose. Doses of PPSV23 are not needed for people immunized with PPSV23 at or after age 55 years.  Meningococcal vaccine. Adults with asplenia or persistent complement component deficiencies should receive 2 doses of quadrivalent meningococcal conjugate (MenACWY-D) vaccine. The doses should be obtained at least 2 months apart. Microbiologists working with certain meningococcal bacteria, [employer] recruits, people at risk during an outbreak, and people who travel to or live in countries with a high rate of meningitis should be immunized. A first-year college student up through age 58 years who is living in a residence hall should receive a dose if she did not receive a dose on or after her 16th birthday. Adults who have certain high-risk conditions should receive one or more doses of vaccine.  Hepatitis A vaccine. Adults who wish to be protected from this disease, have certain high-risk conditions, work with hepatitis A-infected animals, work in hepatitis A research labs, or travel to or work in countries with a high rate of hepatitis A should be  immunized. Adults who were previously unvaccinated and who anticipate close contact with an international adoptee during the first 60 days after arrival in the [place] from a country with a high rate of hepatitis A should be immunized.  Hepatitis B vaccine.  Adults who wish to be protected from this disease, have certain high-risk conditions, may be exposed to blood or other infectious  body fluids, are household contacts or sex partners of hepatitis B positive people, are clients or workers in certain care facilities, or travel to or work in countries with a high rate of hepatitis B should be immunized.  Haemophilus influenzae type b (Hib) vaccine. A previously unvaccinated person with asplenia or sickle cell disease or having a scheduled splenectomy should receive 1 dose of Hib vaccine. Regardless of previous immunization, a recipient of a hematopoietic stem cell transplant should receive a 3-dose series 6 12 months after her successful transplant. Hib vaccine is not recommended for adults with HIV infection.  Preventive Services / Frequency Ages 6 to 39years  Blood pressure check.** / Every 1 to 2 years.  Lipid and cholesterol check.** / Every 5 years beginning at age 39.  Clinical breast exam.** / Every 3 years for women in their 61s and 62s.  BRCA-related cancer risk assessment.** / For women who have family members with a BRCA-related cancer (breast, ovarian, tubal, or peritoneal cancers).  Pap test.** / Every 2 years from ages 47 through 85. Every 3 years starting at age 34 through age 12 or 74 with a history of 3 consecutive normal Pap tests.  HPV screening.** / Every 3 years from ages 46 through ages 43 to 54 with a history of 3 consecutive normal Pap tests.  Hepatitis C blood test.** / For any individual with known risks for hepatitis C.  Skin self-exam. / Monthly.  Influenza vaccine. / Every year.  Tetanus, diphtheria, and acellular pertussis (Tdap, Td) vaccine.** / Consult your health care provider. Pregnant women should receive 1 dose of Tdap vaccine during each pregnancy. 1 dose of Td every 10 years.  Varicella vaccine.** / Consult your health care provider. Pregnant females who do not have evidence of immunity should receive the first dose after pregnancy.  HPV vaccine. / 3 doses over 6 months, if 64 and younger. The vaccine is not recommended for use in  pregnant females. However, pregnancy testing is not needed before receiving a dose.  Measles, mumps, rubella (MMR) vaccine.** / You need at least 1 dose of MMR if you were born in 1957 or later. You may also need a 2nd dose. For females of childbearing age, rubella immunity should be determined. If there is no evidence of immunity, females who are not pregnant should be vaccinated. If there is no evidence of immunity, females who are pregnant should delay immunization until after pregnancy.  Pneumococcal 13-valent conjugate (PCV13) vaccine.** / Consult your health care provider.  Pneumococcal polysaccharide (PPSV23) vaccine.** / 1 to 2 doses if you smoke cigarettes or if you have certain conditions.  Meningococcal vaccine.** / 1 dose if you are age 71 to 35 years and a Market researcher living in a residence hall, or have one of several medical conditions, you need to get vaccinated against meningococcal disease. You may also need additional booster doses.  Hepatitis A vaccine.** / Consult your health care provider.  Hepatitis B vaccine.** / Consult your health care provider.  Haemophilus influenzae type b (Hib) vaccine.** / Consult your health care provider.  Ages 55 to 64years  Blood pressure check.** / Every 1 to 2 years.  Lipid and cholesterol check.** / Every 5 years beginning at age 20 years.  Lung cancer screening. / Every year if you are aged 55 80 years and have a 30-pack-year history of smoking and currently smoke or have quit within the past 15 years. Yearly screening is stopped once you have quit smoking for at least 15 years or develop a health problem that would prevent you from having lung cancer treatment.  Clinical breast exam.** / Every year after age 40 years.  BRCA-related cancer risk assessment.** / For women who have family members with a BRCA-related cancer (breast, ovarian, tubal, or peritoneal cancers).  Mammogram.** / Every year beginning at age 40  years and continuing for as long as you are in good health. Consult with your health care provider.  Pap test.** / Every 3 years starting at age 30 years through age 65 or 70 years with a history of 3 consecutive normal Pap tests.  HPV screening.** / Every 3 years from ages 30 years through ages 65 to 70 years with a history of 3 consecutive normal Pap tests.  Fecal occult blood test (FOBT) of stool. / Every year beginning at age 50 years and continuing until age 75 years. You may not need to do this test if you get a colonoscopy every 10 years.  Flexible sigmoidoscopy or colonoscopy.** / Every 5 years for a flexible sigmoidoscopy or every 10 years for a colonoscopy beginning at age 50 years and continuing until age 75 years.  Hepatitis C blood test.** / For all people born from 1945 through 1965 and any individual with known risks for hepatitis C.  Skin self-exam. / Monthly.  Influenza vaccine. / Every year.  Tetanus, diphtheria, and acellular pertussis (Tdap/Td) vaccine.** / Consult your health care provider. Pregnant women should receive 1 dose of Tdap vaccine during each pregnancy. 1 dose of Td every 10 years.  Varicella vaccine.** / Consult your health care provider. Pregnant females who do not have evidence of immunity should receive the first dose after pregnancy.  Zoster vaccine.** / 1 dose for adults aged 60 years or older.  Measles, mumps, rubella (MMR) vaccine.** / You need at least 1 dose of MMR if you were born in 1957 or later. You may also need a 2nd dose. For females of childbearing age, rubella immunity should be determined. If there is no evidence of immunity, females who are not pregnant should be vaccinated. If there is no evidence of immunity, females who are pregnant should delay immunization until after pregnancy.  Pneumococcal 13-valent conjugate (PCV13) vaccine.** / Consult your health care provider.  Pneumococcal polysaccharide (PPSV23) vaccine.** / 1 to 2 doses if  you smoke cigarettes or if you have certain conditions.  Meningococcal vaccine.** / Consult your health care provider.  Hepatitis A vaccine.** / Consult your health care provider.  Hepatitis B vaccine.** / Consult your health care provider.  Haemophilus influenzae type b (Hib) vaccine.** / Consult your health care provider.  Ages 65 years and over  Blood pressure check.** / Every 1 to 2 years.  Lipid and cholesterol check.** / Every 5 years beginning at age 20 years.  Lung cancer screening. / Every year if you are aged 55 80 years and have a 30-pack-year history of smoking and currently smoke or have quit within the past 15 years. Yearly screening is stopped once you have quit smoking for at least 15 years or develop a health problem that   would prevent you from having lung cancer treatment.  Clinical breast exam.** / Every year after age 103 years.  BRCA-related cancer risk assessment.** / For women who have family members with a BRCA-related cancer (breast, ovarian, tubal, or peritoneal cancers).  Mammogram.** / Every year beginning at age 36 years and continuing for as long as you are in good health. Consult with your health care provider.  Pap test.** / Every 3 years starting at age 5 years through age 85 or 10 years with 3 consecutive normal Pap tests. Testing can be stopped between 65 and 70 years with 3 consecutive normal Pap tests and no abnormal Pap or HPV tests in the past 10 years.  HPV screening.** / Every 3 years from ages 93 years through ages 70 or 45 years with a history of 3 consecutive normal Pap tests. Testing can be stopped between 65 and 70 years with 3 consecutive normal Pap tests and no abnormal Pap or HPV tests in the past 10 years.  Fecal occult blood test (FOBT) of stool. / Every year beginning at age 8 years and continuing until age 45 years. You may not need to do this test if you get a colonoscopy every 10 years.  Flexible sigmoidoscopy or colonoscopy.** /  Every 5 years for a flexible sigmoidoscopy or every 10 years for a colonoscopy beginning at age 69 years and continuing until age 68 years.  Hepatitis C blood test.** / For all people born from [birth year] through 1967 and any individual with known risks for hepatitis C.  Osteoporosis screening.** / A one-time screening for women ages 7 years and over and women at risk for fractures or osteoporosis.  Skin self-exam. / Monthly.  Influenza vaccine. / Every year.  Tetanus, diphtheria, and acellular pertussis (Tdap/Td) vaccine.** / 1 dose of Td every 10 years.  Varicella vaccine.** / Consult your health care provider.  Zoster vaccine.** / 1 dose for adults aged 5 years or older.  Pneumococcal 13-valent conjugate (PCV13) vaccine.** / Consult your health care provider.  Pneumococcal polysaccharide (PPSV23) vaccine.** / 1 dose for all adults aged 74 years and older.  Meningococcal vaccine.** / Consult your health care provider.  Hepatitis A vaccine.** / Consult your health care provider.  Hepatitis B vaccine.** / Consult your health care provider.  Haemophilus influenzae type b (Hib) vaccine.** / Consult your health care provider. ** Family history and personal history of risk and conditions may change your health care provider's recommendations. Document Released: 12/20/2001 Document Revised: 08/14/2013  [facility] Patient Information 2014 [place], [place].   EXERCISE AND DIET:  We recommended that you start or continue a regular exercise program for good health. Regular exercise means any activity that makes your heart beat faster and makes you sweat.  We recommend exercising at least 30 minutes per day at least 3 days a week, preferably 5.  We also recommend a diet low in fat and sugar / carbohydrates.  Inactivity, poor dietary choices and obesity can cause diabetes, heart attack, stroke, and kidney damage, among others.     ALCOHOL AND SMOKING:  Women should limit their alcohol intake to no  more than 7 drinks/beers/glasses of wine (combined, not each!) per week. Moderation of alcohol intake to this level decreases your risk of breast cancer and liver damage.  ( And of course, no recreational drugs are part of a healthy lifestyle.)  Also, you should not be smoking at all or even being exposed to second hand smoke. Most people know smoking can  cause cancer, and various heart and lung diseases, but did you know it also contributes to weakening of your bones?  Aging of your skin?  Yellowing of your teeth and nails?   CALCIUM AND VITAMIN D:  Adequate intake of calcium and Vitamin D are recommended.  The recommendations for exact amounts of these supplements seem to change often, but generally speaking 600 mg of calcium (either carbonate or citrate) and 800 units of Vitamin D per day seems prudent. Certain women may benefit from higher intake of Vitamin D.  If you are among these women, your doctor will have told you during your visit.     PAP SMEARS:  Pap smears, to check for cervical cancer or precancers,  have traditionally been done yearly, although recent scientific advances have shown that most women can have pap smears less often.  However, every woman still should have a physical exam from her gynecologist or primary care physician every year. It will include a breast check, inspection of the vulva and vagina to check for abnormal growths or skin changes, a visual exam of the cervix, and then an exam to evaluate the size and shape of the uterus and ovaries.  And after 65 years of age, a rectal exam is indicated to check for rectal cancers. We will also provide age appropriate advice regarding health maintenance, like when you should have certain vaccines, screening for sexually transmitted diseases, bone density testing, colonoscopy, mammograms, etc.    MAMMOGRAMS:  All women over 71 years old should have a yearly mammogram. Many facilities now offer a "3D" mammogram, which may cost  around $50 extra out of pocket. If possible,  we recommend you accept the option to have the 3D mammogram performed.  It both reduces the number of women who will be called back for extra views which then turn out to be normal, and it is better than the routine mammogram at detecting truly abnormal areas.     COLONOSCOPY:  Colonoscopy to screen for colon cancer is recommended for all women at age 52.  We know, you hate the idea of the prep.  We agree, BUT, having colon cancer and not knowing it is worse!!  Colon cancer so often starts as a polyp that can be seen and removed at colonscopy, which can quite literally save your life!  And if your first colonoscopy is normal and you have no family history of colon cancer, most women don't have to have it again for 10 years.  Once every ten years, you can do something that may end up saving your life, right?  We will be happy to help you get it scheduled when you are ready.  Be sure to check your insurance coverage so you understand how much it will cost.  It may be covered as a preventative service at no cost, but you should check your particular policy.

## 2019-08-14 NOTE — Progress Notes (Signed)
Subjective:    Dawn Alexander is a 63 y.o. female who presents for a Welcome to Medicare exam.   Patient's urinary incontinence medications has not been effective.  She endorses she is still having incontinence issues despite taking the medicine.  Her insurance furthermore will not pay for this medicine any longer. She wonders what else she can use.   She has never been evaluated by a urologist.      Objective:    Today's Vitals   08/14/19 0811 08/14/19 0900  BP: (!) 149/100 (!) 142/82  Pulse: 95   SpO2: 99%   Weight: 241 lb (109.3 kg)   Height: 5' 3.5" (1.613 m)   Body mass index is 42.02 kg/m.  Medications Outpatient Encounter Medications as of 08/14/2019  Medication Sig  . Acetaminophen-Caffeine (EXCEDRIN ASPIRIN FREE PO) Take by mouth daily as needed.  . Ascorbic Acid (VITAMIN C) 1000 MG tablet Take 2,000 mg by mouth daily.  . Cholecalciferol (VITAMIN D3) 125 MCG (5000 UT) CAPS Take 1 capsule by mouth daily.  Marland Kitchen levothyroxine (SYNTHROID) 100 MCG tablet Take 1 tablet (100 mcg total) by mouth daily.  Marland Kitchen MAGNESIUM PO Take 1 tablet by mouth daily.  . SUPER B COMPLEX/C PO Take 1 tablet by mouth daily.  . vitamin B-12 (CYANOCOBALAMIN) 500 MCG tablet Take 500 mcg by mouth daily.  . [DISCONTINUED] solifenacin (VESICARE) 5 MG tablet TAKE 1 TABLET BY MOUTH ONCE DAILY FOR INCONTINENCE  . mirabegron ER (MYRBETRIQ) 25 MG TB24 tablet Take 1 tablet (25 mg total) by mouth daily.   No facility-administered encounter medications on file as of 08/14/2019.      History: Past Medical History:  Diagnosis Date  . Allergy   . Chronic lower back pain   . Fibroid   . Insomnia   . Restless leg syndrome   . Thyroid disease   . Vitamin D deficiency    Past Surgical History:  Procedure Laterality Date  . ELBOW SURGERY Right 1999    Family History  Problem Relation Age of Onset  . Diabetes Mother   . COPD Mother   . Cancer Mother        lung  . Multiple sclerosis Father   . Heart  disease Father   . Cancer Sister        lung  . Stroke Sister   . Cancer Brother        bladder  . Stroke Sister   . Leukemia Sister   . Alcohol abuse Brother   . Healthy Brother   . Healthy Daughter   . Leukemia Brother 5   Social History   Occupational History  . Not on file  Tobacco Use  . Smoking status: Never Smoker  . Smokeless tobacco: Never Used  Substance and Sexual Activity  . Alcohol use: Yes    Alcohol/week: 5.0 standard drinks    Types: 5 Shots of liquor per week  . Drug use: No  . Sexual activity: Yes    Partners: Male    Birth control/protection: Post-menopausal    Tobacco Counseling Counseling given: Not Answered   Immunizations and Health Maintenance Immunization History  Administered Date(s) Administered  . Hepatitis B 08/03/2015, 12/31/2015, 03/15/2016  . Hepatitis B, ped/adol 08/03/2015  . Influenza,inj,Quad PF,6+ Mos 08/14/2019  . Influenza-Unspecified 06/28/2016  . Pneumococcal Conjugate-13 08/03/2015  . Tdap 03/12/2014  . Zoster 08/03/2015   There are no preventive care reminders to display for this patient.  Activities of Daily Living In your  present state of health, do you have any difficulty performing the following activities: 08/14/2019 12/25/2018  Hearing? N N  Vision? N N  Difficulty concentrating or making decisions? N N  Walking or climbing stairs? N N  Dressing or bathing? N N  Doing errands, shopping? N N  Some recent data might be hidden   Advanced Directives: Does Patient Have a Medical Advance Directive?: No    Assessment:    This is a routine wellness examination for this patient .   Vision/Hearing screen  Hearing Screening   125Hz  250Hz  500Hz  1000Hz  2000Hz  3000Hz  4000Hz  6000Hz  8000Hz   Right ear:           Left ear:           Vision Screening Comments: Has regular eye exams with Dr Truman Hayward at [facility] and exercise activities discussed:  Current Exercise Habits: The patient does not  participate in regular exercise at present  Goals   None    Depression Screen PHQ 2/9 Scores 08/14/2019 12/25/2018 03/13/2018 01/29/2018  PHQ - 2 Score 0 0 0 0  PHQ- 9 Score 0 0 0 0     Fall Risk Fall Risk  08/14/2019  Falls in the past year? 0  Number falls in past yr: -  Injury with Fall? -  Follow up -    Cognitive Function:     6CIT Screen 08/14/2019  What Year? 0 points  What month? 0 points  What time? 0 points  Count back from 20 0 points  Months in reverse 0 points  Repeat phrase 0 points  Total Score 0    Patient Care Team: [name], DO as PCP - General (Family Medicine) Salvadore Dom, MD as Consulting Physician (Obstetrics and Gynecology)     Plan:      URINARY INCONTINENCE: - change med to myrbetriq - advised to discuss physical therapy with urology for pelvic floor rehab thru urology- she will ask Urologist about this - referral to urologist  Wellness Exam: - needs to update colonscopy- last done in [place] at age 15.    Health Maintenance  Topic Date Due  . Colon Cancer Screening  12/26/2019*  .  Hepatitis C: One time screening is recommended by Center for Disease Control  (CDC) for  adults born from [birth year] through 1967.   12/26/2019*  . HIV Screening  12/26/2019*  . Mammogram  06/11/2021  . Pap Smear  12/25/2021  . Tetanus Vaccine  03/12/2024  . Flu Shot  Completed  . Urine Protein Check  Discontinued  *Topic was postponed. The date shown is not the original due date.   Immunization History  Administered Date(s) Administered  . Hepatitis B 08/03/2015, 12/31/2015, 03/15/2016  . Hepatitis B, ped/adol 08/03/2015  . Influenza,inj,Quad PF,6+ Mos 08/14/2019  . Influenza-Unspecified 06/28/2016  . Pneumococcal Conjugate-13 08/03/2015  . Tdap 03/12/2014  . Zoster 08/03/2015     Orders Placed This Encounter  Procedures  . Flu Vaccine QUAD 36+ mos IM  . CBC with Differential/Platelet  . Comprehensive metabolic panel  . Hemoglobin  A1c  . Lipid panel  . T3  . T4, free  . TSH  . VITAMIN D 25 Hydroxy (Vit-D Deficiency, Fractures)  . Ambulatory referral to Gastroenterology  . Ambulatory referral to Urology  . EKG 12-Lead  . EKG   I have personally reviewed and noted the following in the patient's chart:   . Medical and social history . Use of alcohol, tobacco or illicit  drugs  . Current medications and supplements . Functional ability and status . Nutritional status . Physical activity . Advanced directives . List of other physicians . Hospitalizations, surgeries, and ER visits in previous 12 months . Vitals . Screenings to include cognitive, depression, and falls . Referrals and appointments  In addition, I have reviewed and discussed with patient certain preventive protocols, quality metrics, and best practice recommendations. A written personalized care plan for preventive services as well as general preventive health recommendations were provided to patient.     [name], DO 08/17/2019

## 2019-08-15 ENCOUNTER — Telehealth: Payer: Self-pay | Admitting: Family Medicine

## 2019-08-15 ENCOUNTER — Ambulatory Visit
Admission: RE | Admit: 2019-08-15 | Discharge: 2019-08-15 | Disposition: A | Discharge status: Home / Self Care | Payer: Medicare HMO | Source: Ambulatory Visit | Attending: Family Medicine | Admitting: Family Medicine

## 2019-08-15 DIAGNOSIS — M25532 Pain in left wrist: Secondary | ICD-10-CM

## 2019-08-15 DIAGNOSIS — S6992XA Unspecified injury of left wrist, hand and finger(s), initial encounter: Secondary | ICD-10-CM | POA: Diagnosis not present

## 2019-08-15 DIAGNOSIS — Z9181 History of falling: Secondary | ICD-10-CM

## 2019-08-15 DIAGNOSIS — M79645 Pain in left finger(s): Secondary | ICD-10-CM

## 2019-08-15 LAB — T3: T3, Total: 85 ng/dL (ref 71–180)

## 2019-08-15 LAB — LIPID PANEL
Chol/HDL Ratio: 3.2 ratio (ref 0.0–4.4)
Cholesterol, Total: 186 mg/dL (ref 100–199)
HDL: 59 mg/dL (ref >39)
LDL Chol Calc (NIH): 109 mg/dL — ABNORMAL HIGH (ref 0–99)
Triglycerides: 100 mg/dL (ref 0–149)
VLDL Cholesterol Cal: 18 mg/dL (ref 5–40)

## 2019-08-15 LAB — COMPREHENSIVE METABOLIC PANEL
ALT: 15 IU/L (ref 0–32)
AST: 16 IU/L (ref 0–40)
Albumin/Globulin Ratio: 1.7 (ref 1.2–2.2)
Albumin: 4.5 g/dL (ref 3.8–4.8)
Alkaline Phosphatase: 89 IU/L (ref 39–117)
BUN/Creatinine Ratio: 25 (ref 12–28)
BUN: 20 mg/dL (ref 8–27)
Bilirubin Total: 0.4 mg/dL (ref 0.0–1.2)
CO2: 27 mmol/L (ref 20–29)
Calcium: 9 mg/dL (ref 8.7–10.3)
Chloride: 102 mmol/L (ref 96–106)
Creatinine, Ser: 0.81 mg/dL (ref 0.57–1.00)
GFR calc Af Amer: 89 mL/min/{1.73_m2} (ref >59)
GFR calc non Af Amer: 77 mL/min/{1.73_m2} (ref >59)
Globulin, Total: 2.6 g/dL (ref 1.5–4.5)
Glucose: 101 mg/dL — ABNORMAL HIGH (ref 65–99)
Potassium: 4.3 mmol/L (ref 3.5–5.2)
Sodium: 139 mmol/L (ref 134–144)
Total Protein: 7.1 g/dL (ref 6.0–8.5)

## 2019-08-15 LAB — CBC WITH DIFFERENTIAL/PLATELET
Basophils Absolute: 0.1 10*3/uL (ref 0.0–0.2)
Basos: 1 %
EOS (ABSOLUTE): 0.3 10*3/uL (ref 0.0–0.4)
Eos: 7 %
Hematocrit: 42.7 % (ref 34.0–46.6)
Hemoglobin: 14.1 g/dL (ref 11.1–15.9)
Immature Grans (Abs): 0 10*3/uL (ref 0.0–0.1)
Immature Granulocytes: 0 %
Lymphocytes Absolute: 1.4 10*3/uL (ref 0.7–3.1)
Lymphs: 29 %
MCH: 28.7 pg (ref 26.6–33.0)
MCHC: 33 g/dL (ref 31.5–35.7)
MCV: 87 fL (ref 79–97)
Monocytes Absolute: 0.6 10*3/uL (ref 0.1–0.9)
Monocytes: 12 %
Neutrophils Absolute: 2.4 10*3/uL (ref 1.4–7.0)
Neutrophils: 51 %
Platelets: 250 10*3/uL (ref 150–450)
RBC: 4.92 x10E6/uL (ref 3.77–5.28)
RDW: 12.7 % (ref 11.7–15.4)
WBC: 4.8 10*3/uL (ref 3.4–10.8)

## 2019-08-15 LAB — HEMOGLOBIN A1C
Est. average glucose Bld gHb Est-mCnc: 120 mg/dL
Hgb A1c MFr Bld: 5.8 % — ABNORMAL HIGH (ref 4.8–5.6)

## 2019-08-15 LAB — VITAMIN D 25 HYDROXY (VIT D DEFICIENCY, FRACTURES): Vit D, 25-Hydroxy: 42 ng/mL (ref 30.0–100.0)

## 2019-08-15 LAB — T4, FREE: Free T4: 1.28 ng/dL (ref 0.82–1.77)

## 2019-08-15 LAB — TSH: TSH: 5.59 u[IU]/mL — ABNORMAL HIGH (ref 0.450–4.500)

## 2019-08-15 NOTE — Telephone Encounter (Signed)
Spoke with patient.  She complains of thumb pain at the base of her left wrist going into her index finger.  Per Dr Raliegh Scarlet xray ordered.  Patient aware.

## 2019-08-15 NOTE — Telephone Encounter (Signed)
Patient called states she advised provider of fall & Lt hand injury @ 10/7 Appt -- Patient now request referral for Xray since hand is hurting worst (Today) than yesterday.  --Forwarding request to medical assistant for approval from provider & to [facility] for referral appt scheduling w/ [facility].  -glh

## 2019-08-17 ENCOUNTER — Encounter: Payer: Self-pay | Admitting: Family Medicine

## 2019-08-17 DIAGNOSIS — E119 Type 2 diabetes mellitus without complications: Secondary | ICD-10-CM | POA: Insufficient documentation

## 2019-08-17 DIAGNOSIS — R32 Unspecified urinary incontinence: Secondary | ICD-10-CM | POA: Insufficient documentation

## 2019-08-19 ENCOUNTER — Encounter: Payer: Self-pay | Admitting: Family Medicine

## 2019-08-19 ENCOUNTER — Telehealth: Payer: Self-pay | Admitting: Family Medicine

## 2019-08-19 MED ORDER — ATORVASTATIN CALCIUM 20 MG PO TABS: 20.0000 mg | ORAL_TABLET | Freq: Every day | ORAL | 1 refills | Status: DC

## 2019-08-19 NOTE — Telephone Encounter (Signed)
Patient called states provider send Mychart  Message stating :  Dawn Alexander,  Dr. Raliegh Scarlet asked that I let you know that we need to bring you in to do an exam and evaluate your pain at the base of base of your left thumb. This is important and needs to be done in the VERY near future. Please call our office to schedule an appointment. &   Dr. Raliegh Scarlet recommends again that you start on cholesterol medications. She agian recommends that you start at a low dose of Lipitor.  Please let us know if you would be agreeable to this.   ---Pt called & made appt and states is agreeable to starting Lipitor Medication .  --Forwarding information to medical asst.  -glh

## 2019-08-23 ENCOUNTER — Encounter: Payer: Self-pay | Admitting: Family Medicine

## 2019-08-27 ENCOUNTER — Ambulatory Visit: Payer: Medicare HMO | Admitting: Family Medicine

## 2019-08-28 ENCOUNTER — Ambulatory Visit (INDEPENDENT_AMBULATORY_CARE_PROVIDER_SITE_OTHER): Payer: Medicare HMO | Admitting: Family Medicine

## 2019-08-28 ENCOUNTER — Other Ambulatory Visit: Payer: Self-pay

## 2019-08-28 ENCOUNTER — Telehealth: Payer: Self-pay | Admitting: Family Medicine

## 2019-08-28 ENCOUNTER — Encounter: Payer: Self-pay | Admitting: Family Medicine

## 2019-08-28 VITALS — BP 140/88 | HR 97 | Temp 98.0°F | Resp 16 | Wt 234.7 lb

## 2019-08-28 DIAGNOSIS — S62329G Displaced fracture of shaft of unspecified metacarpal bone, subsequent encounter for fracture with delayed healing: Secondary | ICD-10-CM | POA: Diagnosis not present

## 2019-08-28 DIAGNOSIS — M79645 Pain in left finger(s): Secondary | ICD-10-CM

## 2019-08-28 DIAGNOSIS — M25542 Pain in joints of left hand: Secondary | ICD-10-CM | POA: Diagnosis not present

## 2019-08-28 DIAGNOSIS — R030 Elevated blood-pressure reading, without diagnosis of hypertension: Secondary | ICD-10-CM

## 2019-08-28 DIAGNOSIS — Z23 Encounter for immunization: Secondary | ICD-10-CM | POA: Diagnosis not present

## 2019-08-28 DIAGNOSIS — E785 Hyperlipidemia, unspecified: Secondary | ICD-10-CM | POA: Diagnosis not present

## 2019-08-28 NOTE — Patient Instructions (Addendum)
Please use thumb spica splint at all times.  Keep splint on your thumb at all times other than when you are washing or bathing- at those times keep it in neutral position and stable.

## 2019-08-28 NOTE — Telephone Encounter (Signed)
While patient was in office w/ provider she request that  Urology Appt : ( be rescheduled for a [facility] location- she doesn't want to drive to [place] )  Hidden? Date Time Department/Location Provider Visit Type Status          No Tue 09/03/19 09:00 AM [facility], BRIAN C NEW PATIENT    ---Forwarding message to [facility] to reschedule & contact patient w/ updates.  --glh

## 2019-08-28 NOTE — Progress Notes (Signed)
Impression and Recommendations:    1. Closed avulsion fracture of shaft of metacarpal bone with delayed healing, subsequent encounter   2. Thumb pain, left   3. Pain involving joint of finger of left hand   4. Hyperlipidemia, unspecified hyperlipidemia type   5. Elevated blood pressure reading   6. Need for pneumococcal vaccination   7. Need for shingles vaccine      - Need for pneumonia vaccine and shingles vaccine today. - Return in 2-6 months for second shingles vaccine.  Hyperlipidemia - Per patient, just got her prescription today. - Patient agrees to begin statin management.  The 10-year ASCVD risk score Mikey Bussing DC Jr., et al., 2013) is: 14.3%   Values used to calculate the score:     Age: 89 years     Sex: Female     Is Non-Hispanic African American: No     Diabetic: Yes     Tobacco smoker: No     Systolic Blood Pressure: 505 mmHg     Is BP treated: No     HDL Cholesterol: 59 mg/dL     Total Cholesterol: 186 mg/dL  - Advised patient to take her Lipitor right before going to bed at night. - Encouraged patient to drink adequate amounts of water to help reduce incidence of S-E. - In addition, encouraged patient to engage in regular physical activity.  - Will re-check liver enzymes in six weeks since starting statin.  - In three months, re-check cholesterol panel.  - Will continue to monitor.  Elevated BP Reading - BP at 157/98 on intake today.  - Discussed risks of ongoing chronic high blood pressure and importance of BP control. - Reviewed goal BP of 135/85, definitely under 140/90.  - Importance of ambulatory blood pressure monitoring discussed with patient today. - Patient knows to sit quietly before measuring her BP, and keep track of trends.  - Will continue to monitor.  Left Thumb Pain - closed avulsion fracture of shaft of metacarpal bone with delayed healing, subsequent encounter - Reviewed symptoms at length with patient today. - Extensive  education provided and all questions answered.  - Discussed referral to Orthopedics with patient today. - Patient understands importance of evaluation by a hand specialist. - Ambulatory referral provided today.  See orders.  - Discussed use of thumb spica splint.  Recommended that the patient obtain a splint as advised, to immobilize the thumb in neutral position until Sports Medicine/Orthopedics can evaluate.  - Patient knows to ask about her ligaments and tendons as well.  - Recommended using ice on the area of pain to alleviate inflammation.  - Advised patient to keep her hand elevated above the level of her heart.  - Reviewed recent imaging with patient today. DG Wrist Complete Left Obtained 08/15/2019: CLINICAL DATA:  Pain, at base of thumb, fall  EXAM: LEFT WRIST - COMPLETE 3+ VIEW  COMPARISON:  None.  FINDINGS: No definite fracture or dislocation of the left wrist. The carpus is normally aligned. There is a tiny ossific fragment at the lateral base of the left first metacarpal. Moderate first carpometacarpal and STT joint arthrosis. Joint spaces are otherwise well preserved. Mild soft tissue edema about the base of the thumb  IMPRESSION: 1. No definite fracture or dislocation of the left wrist. The carpus is normally aligned.  2. There is a tiny ossific fragment at the lateral base of the left first metacarpal, suspicious for a tiny avulsion fracture.   Electronically Signed  By: Eddie Candle M.D.   On: 08/16/2019 09:02  - Will continue to monitor.  BMI Counseling - Body mass index is 40.92 kg/m Explained to patient what BMI refers to, and what it means medically.    Told patient to think about it as a "medical risk stratification measurement" and how increasing BMI is associated with increasing risk/ or worsening state of various diseases such as hypertension, hyperlipidemia, diabetes, premature OA, depression etc.  American Heart Association guidelines  for healthy diet, basically Mediterranean diet, and exercise guidelines of 30 minutes 5 days per week or more discussed in detail.  Health counseling performed.  All questions answered.  - Encouraged patient to exercise, eat healthier, to begin to wean off of prescription intervention.  Lifestyle & Preventative Health Maintenance - Advised patient to continue working toward exercising to improve overall mental, physical, and emotional health.    - Reviewed the "spokes of the wheel" of mood and health management.  Stressed the importance of ongoing prudent habits, including regular exercise, appropriate sleep hygiene, healthful dietary habits, and prayer/meditation to relax.  - Encouraged patient to engage in daily physical activity, especially a formal exercise routine.  Recommended that the patient eventually strive for at least 150 minutes of moderate cardiovascular activity per week according to guidelines established by the [facility].   - Healthy dietary habits encouraged, including low-carb, and high amounts of lean protein in diet.   - Patient should also consume adequate amounts of water.  Recommendations - Advised patient to speak with Dorothea Ogle at front desk for referrals to urology in [place].   Orders Placed This Encounter  Procedures  . Pneumococcal polysaccharide vaccine 23-valent greater than or equal to 2yo subcutaneous/IM  . Varicella-zoster vaccine IM (Shingrix)  . Lipid panel  . ALT  . Ambulatory referral to Orthopedic Surgery    No orders of the defined types were placed in this encounter.   Medications Discontinued During This Encounter  Medication Reason  . SUPER B COMPLEX/C PO Patient Preference     Gross side effects, risk and benefits, and alternatives of medications and treatment plan in general discussed with patient.  Patient is aware that all medications have potential side effects and we are unable to predict every side effect or drug-drug interaction that  may occur.   Patient will call with any questions prior to using medication if they have concerns.    Expresses verbal understanding and consents to current therapy and treatment regimen.  No barriers to understanding were identified.  Red flag symptoms and signs discussed in detail.  Patient expressed understanding regarding what to do in case of emergency\urgent symptoms  Please see AVS handed out to patient at the end of our visit for further patient instructions/ counseling done pertaining to today's office visit.   Return for 6 weeks ALT lab only; 3 months FLP lab only, OV 3 days later.     Note:  This note was prepared with assistance of Dragon voice recognition software. Occasional wrong-word or sound-a-like substitutions may have occurred due to the inherent limitations of voice recognition software.   This document serves as a record of services personally performed by [name], DO. It was created on her behalf by Toni Amend, a trained medical scribe. The creation of this record is based on the scribe's personal observations and the provider's statements to them.   This case required medical decision making of at least moderate complexity. The above documentation has been reviewed to be accurate and  was completed by Marjory Sneddon, D.O.      --------------------------------------------------------------------------------------------------------------------------------------------------------------------------------------------------------------------------------------------    Subjective:     HPI: Dawn Alexander is a 63 y.o. female who presents to [place] at [facility] today for issues as discussed below.  Phillips Odor, am serving as scribe for Dr. [name].  Is going to the urologist on Tuesday, and has another follow-up here at the clinic on October 29.  - Left Thumb Pain Notes "a while back, I reached to grab for  something, and I think I dislocated my thumb, but I did not go to the doctor for it."  States her first knuckle has been large ever since.  Did not feel a pop, "but it immediately swelled up and turned purple and all that stuff."  Went to reach for a cutting board with meat on it, but didn't realize it was going to be as heavy as it was, and her thumb "immediately did what it did."  Thinks it's been almost a year since her initial injury, with symptoms worsening recently.  Left second MCP joint is swollen.    Thinks the base of the thumb started hurting 2-3 months ago, worse now, and she was watching it due to her lack of insurance.  "Now, whenever I use it or hold anything, I can't really grasp anything because it shoots pain."  The pain shoots up her arm, "but doesn't feel like nerve pain."  Notes that the palmar surface of the base of her thumb is where she feels the pain, and it shoots a little bit up her wrist.  Besides ice, has been taking Advil to treat.  Notes "if it's really hurting, I take a total of 600, possibly twice a day, but not usually."  HPI:  Hyperlipidemia:  63 y.o. female here for cholesterol follow-up.   - Patient reports good compliance with treatment plan of:  medication and/ or lifestyle management.    - Patient denies any acute concerns or problems with management plan   - She denies new onset of: myalgias, arthralgias, increased fatigue more than normal, chest pains, exercise intolerance, shortness of breath, dizziness, visual changes, headache, lower extremity swelling or claudication.   Most recent cholesterol panel was:  Lab Results  Component Value Date   CHOL 186 08/14/2019   HDL 59 08/14/2019   LDLCALC 109 (H) 08/14/2019   TRIG 100 08/14/2019   CHOLHDL 3.2 08/14/2019   Hepatic Function Latest Ref Rng & Units 08/14/2019 05/18/2017 04/22/2016  Total Protein 6.0 - 8.5 g/dL 7.1 6.9 6.3  Albumin 3.8 - 4.8 g/dL 4.5 4.2 3.8  AST 0 - 40 IU/L 16 21 14   ALT 0 -  32 IU/L 15 23 13   Alk Phosphatase 39 - 117 IU/L 89 82 72  Total Bilirubin 0.0 - 1.2 mg/dL 0.4 0.4 0.6    Blood Pressure Elevated on Intake:  Patient has not been checking her blood pressure at home.  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  - She denies new onset of: chest pain, exercise intolerance, shortness of breath, dizziness, visual changes, headache, lower extremity swelling or claudication.   Last 3 blood pressure readings in our office are as follows: BP Readings from Last 3 Encounters:  08/28/19 140/88  08/14/19 (!) 142/82  12/25/18 129/81   Filed Weights   08/28/19 1505  Weight: 234 lb 11.2 oz (106.5 kg)      Wt Readings from  Last 3 Encounters:  08/28/19 234 lb 11.2 oz (106.5 kg)  08/14/19 241 lb (109.3 kg)  12/25/18 226 lb 9.6 oz (102.8 kg)   BP Readings from Last 3 Encounters:  08/28/19 140/88  08/14/19 (!) 142/82  12/25/18 129/81   Pulse Readings from Last 3 Encounters:  08/28/19 97  08/14/19 95  12/25/18 74   BMI Readings from Last 3 Encounters:  08/28/19 40.92 kg/m  08/14/19 42.02 kg/m  12/25/18 40.14 kg/m     Patient Care Team    Relationship Specialty Notifications Start End  [name], DO PCP - General Family Medicine  04/15/16   Salvadore Dom, MD Consulting Physician Obstetrics and Gynecology  03/16/17      Patient Active Problem List   Diagnosis Date Noted  . Prediabetes 03/13/2018    Priority: High  . Elevated lipoprotein(a) 03/13/2018    Priority: High  . Morbid obesity ([place]) 04/18/2016    Priority: High  . History of hypertension 03/13/2018    Priority: Medium  . Uterine fibroid- noted on CT scan from 3\21\18 07/15/2017    Priority: Medium  . Degenerative disc disease, lumbar- noted on CT 01-25-17 07/15/2017    Priority: Medium  . Venous (peripheral) insufficiency- B\L L Ext 07/01/2016    Priority: Medium  . Restless legs syndrome (RLS) 05/17/2016    Priority: Medium  .  Insomnia 04/18/2016    Priority: Medium  . GERD (gastroesophageal reflux disease) 04/18/2016    Priority: Medium  . hypothyroidism 04/15/2016    Priority: Medium  . Urinary incontinence, urge 03/16/2017    Priority: Low  . Greater trochanteric bursitis of right hip 09/23/2016    Priority: Low  . Vitamin D deficiency 04/18/2016    Priority: Low  . Elevated blood pressure reading 08/28/2019  . Incontinence in female 08/17/2019  . Urinary incontinence in female 12/25/2018  . Hyperlipidemia 12/25/2018  . History of non anemic vitamin B12 deficiency 03/13/2018  . Bronchitis 01/29/2018  . Cellulitis 11/14/2017  . Chronic hip pain- R 03/16/2017  . Diverticulosis of sigmoid colon 03/16/2017  . Contusion of right hip- fell 12/22/16 01/02/2017  . Chronic Right lower extremity edema 07/01/2016  . Chronic foot pain 07/01/2016  . Chronic pain in right foot 07/01/2016  . Increased vitamin B12 level due to excess supplements 05/17/2016  . seasonal or environmental allergies 05/16/2016  . Chronic pain 11/30/2011    Past Medical history, Surgical history, Family history, Social history, Allergies and Medications have been entered into the medical record, reviewed and changed as needed.    Current Meds  Medication Sig  . Acetaminophen-Caffeine (EXCEDRIN ASPIRIN FREE PO) Take by mouth daily as needed.  . Ascorbic Acid (VITAMIN C) 1000 MG tablet Take 2,000 mg by mouth daily.  Marland Kitchen atorvastatin (LIPITOR) 20 MG tablet Take 1 tablet (20 mg total) by mouth daily.  . Cholecalciferol (VITAMIN D3) 125 MCG (5000 UT) CAPS Take 1 capsule by mouth daily.  Marland Kitchen levothyroxine (SYNTHROID) 100 MCG tablet Take 1 tablet (100 mcg total) by mouth daily.  Marland Kitchen MAGNESIUM PO Take 1 tablet by mouth daily.  . mirabegron ER (MYRBETRIQ) 25 MG TB24 tablet Take 1 tablet (25 mg total) by mouth daily.  . vitamin B-12 (CYANOCOBALAMIN) 500 MCG tablet Take 500 mcg by mouth daily.    Allergies:  No Known Allergies   Review of  Systems:  A fourteen system review of systems was performed and found to be positive as per HPI.   Objective:   Blood pressure  140/88, pulse 97, temperature 98 F (36.7 C), resp. rate 16, weight 234 lb 11.2 oz (106.5 kg), SpO2 98 %. Body mass index is 40.92 kg/m. General:  Well Developed, well nourished, appropriate for stated age.  Neuro:  Alert and oriented,  extra-ocular muscles intact  HEENT:  Normocephalic, atraumatic, neck supple Skin:  no gross rash, warm, pink. Cardiac:  RRR, S1 S2 Respiratory:  ECTA B/L and A/P, Not using accessory muscles, speaking in full sentences- unlabored. Vascular:  Ext warm, no cyanosis apprec.; cap RF less 2 sec. Psych:  No HI/SI, judgement and insight good, Euthymic mood. Full Affect.

## 2019-08-29 ENCOUNTER — Encounter: Payer: Self-pay | Admitting: Physician Assistant

## 2019-08-29 ENCOUNTER — Ambulatory Visit (INDEPENDENT_AMBULATORY_CARE_PROVIDER_SITE_OTHER): Payer: Medicare HMO | Admitting: Orthopaedic Surgery

## 2019-08-29 DIAGNOSIS — M79645 Pain in left finger(s): Secondary | ICD-10-CM

## 2019-08-29 MED ORDER — BUPIVACAINE HCL 0.25 % IJ SOLN
0.3300 mL | INTRAMUSCULAR | Status: AC | PRN
  Administered 2019-08-29: .33 mL

## 2019-08-29 MED ORDER — LIDOCAINE HCL 1 % IJ SOLN
3.0000 mL | INTRAMUSCULAR | Status: AC | PRN
  Administered 2019-08-29: 3 mL

## 2019-08-29 MED ORDER — METHYLPREDNISOLONE ACETATE 40 MG/ML IJ SUSP
13.3300 mg | INTRAMUSCULAR | Status: AC | PRN
  Administered 2019-08-29: 13.33 mg

## 2019-08-29 NOTE — Progress Notes (Signed)
Office Visit Note   Patient: Dawn Alexander           Date of Birth: 02-Apr-1956           MRN: [id number] Visit Date: 08/29/2019              Requested by: [name], DO [place],  [place] [address] PCP: [name], DO   Assessment & Plan: Visit Diagnoses:  1. 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  We will inject this with cortisone today.  I will also provide the patient with a removable thumb spica splint to wear for comfort while the injection kicks in.  She will follow-up with [place] as needed.  Follow-Up Instructions: Return if symptoms worsen or fail to improve.   Orders:  Orders Placed This Encounter  Procedures  . Hand/UE Inj: L thumb CMC   No orders of the defined types were placed in this encounter.     Procedures: Hand/UE Inj: L thumb CMC for osteoarthritis on 08/29/2019 3:45 PM Medications: 0.33 mL bupivacaine 0.25 %; 3 mL lidocaine 1 %; 13.33 mg methylPREDNISolone acetate 40 MG/ML      Clinical Data: No additional findings.   Subjective: Chief Complaint  Patient presents with  . Left Hand - Pain    HPI patient is a pleasant 63 year old right-hand-dominant female who presents to our clinic today with left thumb pain.  This began approximately 2 months ago without any injury or change in activity.  The pain she has is to the first [facility] joint.  Worse when she is trying to grasp things.  She has tried NSAIDs without relief of symptoms.  No numbness, tingling or burning.  She was seen by her PCP recently where x-rays were ordered.  X-rays demonstrate a small avulsion to the first [facility] joint.  Review of Systems as detailed in HPI.  All others reviewed and are negative.   Objective: Vital Signs: There were no vitals taken for this visit.  Physical Exam well-developed and well-nourished female in no acute distress.  Alert and oriented x3.  Ortho Exam examination of her left thumb  has moderate tenderness along the first CMC joint.  No tenderness to the first dorsal compartment.  She has increased pain with flexion of the thumb.  Collaterals are stable.  She is neurovascularly intact distally.  Specialty Comments:  No specialty comments available.  Imaging: No new imaging   PMFS History: Patient Active Problem List   Diagnosis Date Noted  . Elevated blood pressure reading 08/28/2019  . Incontinence in female 08/17/2019  . Urinary incontinence in female 12/25/2018  . Hyperlipidemia 12/25/2018  . History of non anemic vitamin B12 deficiency 03/13/2018  . Prediabetes 03/13/2018  . History of hypertension 03/13/2018  . 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 Bronchitis 01/29/2018  . Cellulitis 11/14/2017  . Uterine fibroid- noted on CT scan from 3\21\18 07/15/2017  . Degenerative disc disease, lumbar- noted on CT 01-25-17 07/15/2017  . Chronic hip pain- R 03/16/2017  . Urinary incontinence, urge 03/16/2017  . Diverticulosis of sigmoid colon 03/16/2017  . Contusion of right hip- fell 12/22/16 01/02/2017  . Greater trochanteric bursitis of right hip 09/23/2016  . Chronic Right lower extremity edema 07/01/2016  . Chronic foot pain 07/01/2016  . Venous (peripheral) insufficiency- B\L L Ext 07/01/2016  . Chronic pain in right foot 07/01/2016  . Increased vitamin B12 level due to excess supplements 05/17/2016  .  Restless legs syndrome (RLS) 05/17/2016  . seasonal or environmental allergies 05/16/2016  . Insomnia 04/18/2016  . Vitamin D deficiency 04/18/2016  . Morbid obesity ([place]) 04/18/2016  . GERD (gastroesophageal reflux disease) 04/18/2016  . hypothyroidism 04/15/2016  . Chronic pain 11/30/2011   Past Medical History:  Diagnosis Date  . Allergy   . Chronic lower back pain   . Fibroid   . Insomnia   . Restless leg syndrome   . Thyroid disease   . Vitamin D deficiency     Family History  Problem Relation Age of Onset  . Diabetes Mother   . COPD  Mother   . Cancer Mother        lung  . Multiple sclerosis Father   . Heart disease Father   . Cancer Sister        lung  . Stroke Sister   . Cancer Brother        bladder  . Stroke Sister   . Leukemia Sister   . Alcohol abuse Brother   . Healthy Brother   . Healthy Daughter   . Leukemia Brother 5    Past Surgical History:  Procedure Laterality Date  . ELBOW SURGERY Right 1999   Social History   Occupational History  . Not on file  Tobacco Use  . Smoking status: Never Smoker  . Smokeless tobacco: Never Used  Substance and Sexual Activity  . Alcohol use: Yes    Alcohol/week: 5.0 standard drinks    Types: 5 Shots of liquor per week  . Drug use: No  . Sexual activity: Yes    Partners: Male    Birth control/protection: Post-menopausal

## 2019-09-03 ENCOUNTER — Ambulatory Visit: Payer: Self-pay | Admitting: Urology

## 2019-09-05 ENCOUNTER — Ambulatory Visit: Payer: Self-pay | Admitting: Family Medicine

## 2019-09-16 ENCOUNTER — Encounter: Payer: Self-pay | Admitting: Family Medicine

## 2019-09-25 DIAGNOSIS — R351 Nocturia: Secondary | ICD-10-CM | POA: Diagnosis not present

## 2019-09-25 DIAGNOSIS — N3941 Urge incontinence: Secondary | ICD-10-CM | POA: Diagnosis not present

## 2019-09-25 DIAGNOSIS — R350 Frequency of micturition: Secondary | ICD-10-CM | POA: Diagnosis not present

## 2019-10-08 ENCOUNTER — Other Ambulatory Visit: Payer: Self-pay

## 2019-10-08 ENCOUNTER — Other Ambulatory Visit: Payer: Medicare HMO

## 2019-10-08 DIAGNOSIS — E785 Hyperlipidemia, unspecified: Secondary | ICD-10-CM | POA: Diagnosis not present

## 2019-10-09 LAB — ALT: ALT: 18 IU/L (ref 0–32)

## 2019-11-25 ENCOUNTER — Other Ambulatory Visit: Payer: Medicare HMO

## 2019-11-28 ENCOUNTER — Ambulatory Visit: Payer: Medicare HMO | Admitting: Family Medicine

## 2020-01-03 ENCOUNTER — Other Ambulatory Visit: Payer: Self-pay | Admitting: Family Medicine

## 2020-01-03 DIAGNOSIS — E079 Disorder of thyroid, unspecified: Secondary | ICD-10-CM

## 2020-01-08 ENCOUNTER — Telehealth: Payer: Self-pay | Admitting: Family Medicine

## 2020-01-08 DIAGNOSIS — E079 Disorder of thyroid, unspecified: Secondary | ICD-10-CM

## 2020-01-08 MED ORDER — LEVOTHYROXINE SODIUM 100 MCG PO TABS: 100.0000 ug | ORAL_TABLET | Freq: Every day | ORAL | 0 refills | Status: DC

## 2020-01-08 NOTE — Telephone Encounter (Signed)
Per Dr. Raliegh Scarlet ok to give patient 30 day supply. Sending to pharmacy.   Please call patient to schedule apt via telehealth (per Dr. Jenetta Downer)    AS, CMA

## 2020-01-08 NOTE — Telephone Encounter (Signed)
Patient called states she know Provider gave her 30 pill of Thyroid Rx but she will be in [place] till  April 12th ( will run out while up there & is concerned)  ---Patient requested addt'l supply (enough to get back to [place] on 4/12 says will come in ---Advised her no OV available this week before she leaves to go out of town.  ---Pt willing to come in for LAB APPT & OV required & she  Missed  Both back in Jan (Advised her will send message to med asst for provider review / approval to see if acceptable for Rx refill addition).  --Forwarding message to med asst.  --glh

## 2020-01-15 ENCOUNTER — Other Ambulatory Visit: Payer: Self-pay | Admitting: Family Medicine

## 2020-01-25 ENCOUNTER — Other Ambulatory Visit: Payer: Self-pay | Admitting: Family Medicine

## 2020-01-25 DIAGNOSIS — E079 Disorder of thyroid, unspecified: Secondary | ICD-10-CM

## 2020-02-18 ENCOUNTER — Other Ambulatory Visit: Payer: Medicare HMO

## 2020-02-18 ENCOUNTER — Other Ambulatory Visit: Payer: Self-pay

## 2020-02-18 DIAGNOSIS — E785 Hyperlipidemia, unspecified: Secondary | ICD-10-CM | POA: Diagnosis not present

## 2020-02-19 LAB — LIPID PANEL
Chol/HDL Ratio: 2.2 ratio (ref 0.0–4.4)
Cholesterol, Total: 114 mg/dL (ref 100–199)
HDL: 51 mg/dL (ref >39)
LDL Chol Calc (NIH): 46 mg/dL (ref 0–99)
Triglycerides: 90 mg/dL (ref 0–149)
VLDL Cholesterol Cal: 17 mg/dL (ref 5–40)

## 2020-02-20 ENCOUNTER — Encounter: Payer: Self-pay | Admitting: Family Medicine

## 2020-02-20 ENCOUNTER — Ambulatory Visit (INDEPENDENT_AMBULATORY_CARE_PROVIDER_SITE_OTHER): Payer: Medicare HMO | Admitting: Family Medicine

## 2020-02-20 ENCOUNTER — Other Ambulatory Visit: Payer: Self-pay

## 2020-02-20 VITALS — Ht 64.5 in | Wt 234.0 lb

## 2020-02-20 DIAGNOSIS — R030 Elevated blood-pressure reading, without diagnosis of hypertension: Secondary | ICD-10-CM

## 2020-02-20 DIAGNOSIS — E785 Hyperlipidemia, unspecified: Secondary | ICD-10-CM | POA: Diagnosis not present

## 2020-02-20 DIAGNOSIS — E079 Disorder of thyroid, unspecified: Secondary | ICD-10-CM | POA: Diagnosis not present

## 2020-02-20 DIAGNOSIS — S62329G Displaced fracture of shaft of unspecified metacarpal bone, subsequent encounter for fracture with delayed healing: Secondary | ICD-10-CM

## 2020-02-20 MED ORDER — ATORVASTATIN CALCIUM 20 MG PO TABS: 20.0000 mg | ORAL_TABLET | Freq: Every day | ORAL | 1 refills | Status: DC

## 2020-02-20 MED ORDER — LEVOTHYROXINE SODIUM 100 MCG PO TABS: 100.0000 ug | ORAL_TABLET | Freq: Every day | ORAL | 1 refills | Status: DC

## 2020-02-20 NOTE — Progress Notes (Signed)
Telehealth office visit note for Dawn Alexander, D.O- at Primary Care at [facility]   I connected with current patient today and verified that I am speaking with the correct person   . Location of the patient: Home . Location of the provider: Office - This visit type was conducted due to national recommendations for restrictions regarding the COVID-19 Pandemic (e.g. social distancing) in an effort to limit this patient's exposure and mitigate transmission in our community.    - No physical exam could be performed with this format, beyond that communicated to [place] by the patient/ family members as noted.   - Additionally my office staff/ schedulers were to discuss with the patient that there may be a monetary charge related to this service, depending on their medical insurance.  My understanding is that patient understood and consented to proceed.     _________________________________________________________________________________   History of Present Illness:   I, Toni Amend, am serving as Education administrator for [employer].  She has been drinking more water since she's returned home from [place].  She was taking care of her mother up in [place] and ended up spending a month there.  HPI:  Hyperlipidemia:  64 y.o. female here for cholesterol follow-up.   - Patient reports good compliance with treatment plan of:  medication and/ or lifestyle management.    - Patient denies any acute concerns or problems with management plan   - She denies new onset of: myalgias, arthralgias, increased fatigue more than normal, chest pains, exercise intolerance, shortness of breath, dizziness, visual changes, headache, lower extremity swelling or claudication.   Most recent cholesterol panel was:  Lab Results  Component Value Date   CHOL 114 02/18/2020   HDL 51 02/18/2020   LDLCALC 46 02/18/2020   TRIG 90 02/18/2020   CHOLHDL 2.2 02/18/2020   Hepatic Function Latest Ref Rng & Units  10/08/2019 08/14/2019 05/18/2017  Total Protein 6.0 - 8.5 g/dL - 7.1 6.9  Albumin 3.8 - 4.8 g/dL - 4.5 4.2  AST 0 - 40 IU/L - 16 21  ALT 0 - 32 IU/L 18 15 23   Alk Phosphatase 39 - 117 IU/L - 89 82  Total Bilirubin 0.0 - 1.2 mg/dL - 0.4 0.4   History of Hypertension:  Hasn't been checking her blood pressure at home.  - Patient reports good compliance with medication and/or lifestyle modification  - Her denies acute concerns or problems related to treatment plan  - She denies new onset of: chest pain, exercise intolerance, shortness of breath, dizziness, visual changes, headache, lower extremity swelling or claudication.   Last 3 blood pressure readings in our office are as follows: BP Readings from Last 3 Encounters:  08/28/19 140/88  08/14/19 (!) 142/82  12/25/18 129/81   Filed Weights   02/20/20 1402  Weight: 234 lb (106.1 kg)     No flowsheet data found.  Depression screen [facility] 2/9 02/20/2020 08/28/2019 08/14/2019 12/25/2018 03/13/2018  Decreased Interest 0 0 0 0 0  Down, Depressed, Hopeless 0 0 0 0 0  PHQ - 2 Score 0 0 0 0 0  Altered sleeping 0 0 0 0 0  Tired, decreased energy 0 0 0 0 0  Change in appetite 0 0 0 0 0  Feeling bad or failure about yourself  0 0 0 0 0  Trouble concentrating 0 0 0 0 0  Moving slowly or fidgety/restless 0 0 0 0 0  Suicidal thoughts 0 0 0  0 0  PHQ-9 Score 0 0 0 0 0  Difficult doing work/chores - Not difficult at all - Not difficult at all Not difficult at all      Impression and Recommendations:     1. Hyperlipidemia, unspecified hyperlipidemia type   2. Elevated blood pressure reading- h/o HTN   3. M.O. ([place])   4. hypothyroidism   5. Closed avulsion fracture of shaft of metacarpal bone with delayed healing, subsequent encounter     Elevated BP Reading - h/o HTN - Last OV, asked patient to check her blood pressure at home. - Per patient, has not been checking her BP and pulse at home.  - Discussed that patient's BP has been elevated  the last few times she has been seen in the office, 140/88, 157/98, 142/82, 149/100.  - Ambulatory blood pressure monitoring STRONGLY encouraged.  - Advised patient to keep a log of her blood pressure and bring in every office visit.  Reminded patient that if she ever feels poorly in any way, to check her blood pressure and pulse.  - Advised patient of goal BP consistently under 140/90, ideally under 130/80.  - If BP is running 140/90 or more on a regular basis, patient knows to call in to receive follow-up sooner than planned.  - Counseled patient on pathophysiology of disease and discussed various treatment options, which always includes dietary and lifestyle modification as first line.   - Lifestyle changes such as dash and heart healthy diets and engaging in a regular exercise program discussed extensively with patient.   - We will continue to monitor.   Hyperlipidemia - Began statin last OV.  - Last FLP obtained 2 days ago:  Triglycerides = 90, down from 100 six months prior. HDL = 51, down from 59 six months prior. LDL = 46, down from 109 six months prior.  - Cholesterol levels are much improved, at goal and stable.  - Pt will continue current treatment regimen.  See med list. - to help reduce incidence of side effects on statin, advised patient to hydrate adequately and engage in physical activity daily.  - Prudent dietary changes such as low saturated & trans fat diets for hyperlipidemia and low carb diets for hypertriglyceridemia discussed with patient.    - Encouraged patient to follow AHA guidelines for regular exercise and also engage in weight loss if BMI above 25.   - We will continue to monitor and re-check in six months or so.   Hypothyroidism - Stable at this time. - Continue management as established.  See med list. - Will continue to monitor.   Recommendations - Due for full fasting lab work early October 2021.    - As part of my medical decision  making, I reviewed the following data within the [place] History obtained from pt /family, CMA notes reviewed and incorporated if applicable, Labs reviewed, Radiograph/ tests reviewed if applicable and OV notes from prior OV's with me, as well as other specialists she/he has seen since seeing me last, were all reviewed and used in my medical decision making process today.    - Additionally, when appropriate, discussion had with patient regarding our treatment plan, and their biases/concerns about that plan were used in my medical decision making today.    - The patient agreed with the plan and demonstrated an understanding of the instructions.   No barriers to understanding were identified.     - The patient was advised to call back  or seek an in-person evaluation if the symptoms worsen or if the condition fails to improve as anticipated.   Return for f/up 3 months with log of blood pressures and pulse. Call with BP log in 10-12 days.     Meds ordered this encounter  Medications  . levothyroxine (SYNTHROID) 100 MCG tablet    Sig: Take 1 tablet (100 mcg total) by mouth daily before breakfast.    Dispense:  90 tablet    Refill:  1  . atorvastatin (LIPITOR) 20 MG tablet    Sig: Take 1 tablet (20 mg total) by mouth at bedtime.    Dispense:  90 tablet    Refill:  1    Medications Discontinued During This Encounter  Medication Reason  . atorvastatin (LIPITOR) 20 MG tablet Reorder  . levothyroxine (SYNTHROID) 100 MCG tablet Reorder      Time spent on visit including pre-visit chart review and post-visit care was 13 minutes.   Note:  This note was prepared with assistance of Dragon voice recognition software. Occasional wrong-word or sound-a-like substitutions may have occurred due to the inherent limitations of voice recognition software.  The [place] was signed into law in 2016 which includes the topic of electronic health records.  This provides  immediate access to information in MyChart.  This includes consultation notes, operative notes, office notes, lab results and pathology reports.  If you have any questions about what you read please let us know at your next visit or call us at the office.  We are right here with you.  This document serves as a record of services personally performed by Dawn Dance, DO. It was created on her behalf by Toni Amend, a trained medical scribe. The creation of this record is based on the scribe's personal observations and the provider's statements to them.    The above documentation from Toni Amend, medical scribe, has been reviewed by Marjory Sneddon, D.O. __________________________________________________________________________________     Patient Care Team    Relationship Specialty Notifications Start End  Dawn Dance, DO PCP - General Family Medicine  04/15/16   Salvadore Dom, MD Consulting Physician Obstetrics and Gynecology  03/16/17      -Vitals obtained; medications/ allergies reconciled;  personal medical, social, Sx etc.histories were updated by CMA, reviewed by me and are reflected in chart   Patient Active Problem List   Diagnosis Date Noted  . Prediabetes 03/13/2018  . 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 Morbid obesity ([place]) 04/18/2016  . History of hypertension 03/13/2018  . Uterine fibroid- noted on CT scan from 3\21\18 07/15/2017  . Degenerative disc disease, lumbar- noted on CT 01-25-17 07/15/2017  . Venous (peripheral) insufficiency- B\L L Ext 07/01/2016  . Restless legs syndrome (RLS) 05/17/2016  . Insomnia 04/18/2016  . GERD (gastroesophageal reflux disease) 04/18/2016  . hypothyroidism 04/15/2016  . Urinary incontinence, urge 03/16/2017  . Greater trochanteric bursitis of right hip 09/23/2016  . Vitamin D deficiency 04/18/2016  . Elevated blood pressure reading 08/28/2019  . Incontinence in female 08/17/2019  . Urinary  incontinence in female 12/25/2018  . Hyperlipidemia 12/25/2018  . History of non anemic vitamin B12 deficiency 03/13/2018  . Bronchitis 01/29/2018  . Cellulitis 11/14/2017  . Chronic hip pain- R 03/16/2017  . Diverticulosis of sigmoid colon 03/16/2017  . Contusion of right hip- fell 12/22/16 01/02/2017  . Chronic Right lower extremity edema 07/01/2016  . Chronic foot pain 07/01/2016  . Chronic pain in right foot 07/01/2016  .  Increased vitamin B12 level due to excess supplements 05/17/2016  . seasonal or environmental allergies 05/16/2016  . Chronic pain 11/30/2011     Current Meds  Medication Sig  . Acetaminophen-Caffeine (EXCEDRIN ASPIRIN FREE PO) Take by mouth daily as needed.  . Ascorbic Acid (VITAMIN C) 1000 MG tablet Take 2,000 mg by mouth daily.  Marland Kitchen atorvastatin (LIPITOR) 20 MG tablet Take 1 tablet (20 mg total) by mouth at bedtime.  . B Complex Vitamins (B COMPLEX-B12 PO) Take by mouth.  . Cholecalciferol (VITAMIN D3) 125 MCG (5000 UT) CAPS Take 1 capsule by mouth daily.  Marland Kitchen levothyroxine (SYNTHROID) 100 MCG tablet Take 1 tablet (100 mcg total) by mouth daily before breakfast.  . MAGNESIUM PO Take 1 tablet by mouth daily.  . vitamin B-12 (CYANOCOBALAMIN) 500 MCG tablet Take 500 mcg by mouth daily.  . [DISCONTINUED] atorvastatin (LIPITOR) 20 MG tablet Take 1 tablet (20 mg total) by mouth daily.  . [DISCONTINUED] levothyroxine (SYNTHROID) 100 MCG tablet TAKE 1 TABLET EVERY DAY (NEEDS APPOINTMENT FOR FURTHER REFILLS)     Allergies:  No Known Allergies   ROS:  See above HPI for pertinent positives and negatives   Objective:   Height 5' 4.5" (1.638 m), weight 234 lb (106.1 kg).  (if some vitals are omitted, this means that patient was UNABLE to obtain them even though they were asked to get them prior to OV today.  They were asked to call us at their earliest convenience with these once obtained. ) General: A & O * 3; sounds in no acute distress; in usual state of health.   Skin: Pt confirms warm and dry extremities and pink fingertips HEENT: Pt confirms lips non-cyanotic Chest: Patient confirms normal chest excursion and movement Respiratory: speaking in full sentences, no conversational dyspnea; patient confirms no use of accessory muscles Psych: insight appears good, mood- appears full

## 2020-02-26 ENCOUNTER — Encounter: Payer: Self-pay | Admitting: Family Medicine

## 2020-02-26 ENCOUNTER — Telehealth: Payer: Self-pay | Admitting: Family Medicine

## 2020-02-26 NOTE — Telephone Encounter (Signed)
Start hctz 25mg   1 po qd Disp 90, no rf  - check bp daily and write down BP and HR's. Bring in to OV next time/ each time and she will need to f/up in 4wks since starting a new med

## 2020-02-26 NOTE — Telephone Encounter (Signed)
Please advise. AS, CMA   From visit 02/20/2020 :Elevated BP Reading - h/o HTN - Last OV, asked patient to check her blood pressure at home. - Per patient, has not been checking her BP and pulse at home.   - Discussed that patient's BP has been elevated the last few times she has been seen in the office, 140/88, 157/98, 142/82, 149/100.   - Ambulatory blood pressure monitoring STRONGLY encouraged.   - Advised patient to keep a log of her blood pressure and bring in every office visit.  Reminded patient that if she ever feels poorly in any way, to check her blood pressure and pulse.   - Advised patient of goal BP consistently under 140/90, ideally under 130/80.   - If BP is running 140/90 or more on a regular basis, patient knows to call in to receive follow-up sooner than planned.   - Counseled patient on pathophysiology of disease and discussed various treatment options, which always includes dietary and lifestyle modification as first line.    - Lifestyle changes such as dash and heart healthy diets and engaging in a regular exercise program discussed extensively with patient.    - We will continue to monitor.

## 2020-02-26 NOTE — Telephone Encounter (Signed)
Pt called states she was told to monitor her BP for several days & call in the report & if elevated she would prescribe Rx for patient.  Pt gave only 2 readings  Says it's been between 144/88 & 144/97 for several days.  --Patient states uses :   [place], [place] [phone] (Phone) [phone] (Fax)   --- if any questions pls call patient @ [phone].  --glh

## 2020-02-26 NOTE — Telephone Encounter (Signed)
Please advise. AS, CMA  From visit 02/20/2020 :Elevated BP Reading - h/o HTN - Last OV, asked patient to check her blood pressure at home. - Per patient, has not been checking her BP and pulse at home.   - Discussed that patient's BP has been elevated the last few times she has been seen in the office, 140/88, 157/98, 142/82, 149/100.   - Ambulatory blood pressure monitoring STRONGLY encouraged.   - Advised patient to keep a log of her blood pressure and bring in every office visit.  Reminded patient that if she ever feels poorly in any way, to check her blood pressure and pulse.   - Advised patient of goal BP consistently under 140/90, ideally under 130/80.   - If BP is running 140/90 or more on a regular basis, patient knows to call in to receive follow-up sooner than planned.   - Counseled patient on pathophysiology of disease and discussed various treatment options, which always includes dietary and lifestyle modification as first line.    - Lifestyle changes such as dash and heart healthy diets and engaging in a regular exercise program discussed extensively with patient.    - We will continue to monitor.

## 2020-02-27 MED ORDER — HYDROCHLOROTHIAZIDE 25 MG PO TABS: 25.0000 mg | ORAL_TABLET | Freq: Every day | ORAL | 0 refills | Status: DC

## 2020-02-27 NOTE — Telephone Encounter (Signed)
Medication sent to pharmacy. Left patient a message to call back for medication direction and BP instructions. AS, CMA

## 2020-02-27 NOTE — Telephone Encounter (Signed)
Patient is aware of instructions below and medication directions. Patient call transferred to front desk for scheduling. AS, CMA

## 2020-03-03 ENCOUNTER — Encounter: Payer: Self-pay | Admitting: Family Medicine

## 2020-03-10 DIAGNOSIS — H40033 Anatomical narrow angle, bilateral: Secondary | ICD-10-CM | POA: Diagnosis not present

## 2020-03-10 DIAGNOSIS — H2513 Age-related nuclear cataract, bilateral: Secondary | ICD-10-CM | POA: Diagnosis not present

## 2020-03-25 NOTE — Progress Notes (Deleted)
Telehealth office visit note for Dawn Reid, PA-C- at Primary Care at [facility]   I connected with Dawn Alexander on 03/26/20 by telephone and verified that I am speaking with the correct person using two identifiers.   . Location of the patient: Home . Location of the provider: Office - This visit type was conducted due to national recommendations for restrictions regarding the COVID-19 Pandemic (e.g. social distancing) in an effort to limit this patient's exposure and mitigate transmission in our community.    - No physical exam could be performed with this format, beyond that communicated to [place] by the patient/ family members as noted.   - Additionally my office staff/ schedulers were to discuss with the patient that there may be a monetary charge related to this service, depending on their medical insurance.  My understanding is that patient understood and consented to proceed.     _________________________________________________________________________________   History of Present Illness: Pt calls in for 4 week follow-up on hypertension.  HTN: Pt denies chest pain, palpitations, dizziness or leg swelling. Taking medication as directed without side effects. Checks BP at home ***times/wk and readings range in ***. Pt follows a low salt diet.     No flowsheet data found.  Depression screen [facility] 2/9 02/20/2020 08/28/2019 08/14/2019 12/25/2018 03/13/2018  Decreased Interest 0 0 0 0 0  Down, Depressed, Hopeless 0 0 0 0 0  PHQ - 2 Score 0 0 0 0 0  Altered sleeping 0 0 0 0 0  Tired, decreased energy 0 0 0 0 0  Change in appetite 0 0 0 0 0  Feeling bad or failure about yourself  0 0 0 0 0  Trouble concentrating 0 0 0 0 0  Moving slowly or fidgety/restless 0 0 0 0 0  Suicidal thoughts 0 0 0 0 0  PHQ-9 Score 0 0 0 0 0  Difficult doing work/chores - Not difficult at all - Not difficult at all Not difficult at all      Impression and Recommendations:     No diagnosis  found.     - As part of my medical decision making, I reviewed the following data within the [place] History obtained from pt /family, CMA notes reviewed and incorporated if applicable, Labs reviewed, Radiograph/ tests reviewed if applicable and OV notes from prior OV's with me, as well as any other specialists she/he has seen since seeing me last, were all reviewed and used in my medical decision making process today.    - Additionally, when appropriate, discussion had with patient regarding our treatment plan, and their biases/concerns about that plan were used in my medical decision making today.    - The patient agreed with the plan and demonstrated an understanding of the instructions.   No barriers to understanding were identified.     - The patient was advised to call back or seek an in-person evaluation if the symptoms worsen or if the condition fails to improve as anticipated.   No follow-ups on file.    No orders of the defined types were placed in this encounter.   No orders of the defined types were placed in this encounter.   There are no discontinued medications.     Time spent on visit including pre-visit chart review and post-visit care was *** minutes.      The [place] was signed into law in 2016 which includes the topic of  electronic health records.  This provides immediate access to information in MyChart.  This includes consultation notes, operative notes, office notes, lab results and pathology reports.  If you have any questions about what you read please let us know at your next visit or call us at the office.  We are right here with you.   __________________________________________________________________________________     Patient Care Team    Relationship Specialty Notifications Start End  Dawn Alexander, [place] PCP - General   03/08/20   Salvadore Dom, MD Consulting Physician Obstetrics and Gynecology   03/16/17      -Vitals obtained; medications/ allergies reconciled;  personal medical, social, Sx etc.histories were updated by CMA, reviewed by me and are reflected in chart   Patient Active Problem List   Diagnosis Date Noted  . Elevated blood pressure reading 08/28/2019  . Incontinence in female 08/17/2019  . Urinary incontinence in female 12/25/2018  . Hyperlipidemia 12/25/2018  . History of non anemic vitamin B12 deficiency 03/13/2018  . Prediabetes 03/13/2018  . History of hypertension 03/13/2018  . 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 Bronchitis 01/29/2018  . Cellulitis 11/14/2017  . Uterine fibroid- noted on CT scan from 3\21\18 07/15/2017  . Degenerative disc disease, lumbar- noted on CT 01-25-17 07/15/2017  . Chronic hip pain- R 03/16/2017  . Urinary incontinence, urge 03/16/2017  . Diverticulosis of sigmoid colon 03/16/2017  . Contusion of right hip- fell 12/22/16 01/02/2017  . Greater trochanteric bursitis of right hip 09/23/2016  . Chronic Right lower extremity edema 07/01/2016  . Chronic foot pain 07/01/2016  . Venous (peripheral) insufficiency- B\L L Ext 07/01/2016  . Chronic pain in right foot 07/01/2016  . Increased vitamin B12 level due to excess supplements 05/17/2016  . Restless legs syndrome (RLS) 05/17/2016  . seasonal or environmental allergies 05/16/2016  . Insomnia 04/18/2016  . Vitamin D deficiency 04/18/2016  . Morbid obesity ([place]) 04/18/2016  . GERD (gastroesophageal reflux disease) 04/18/2016  . hypothyroidism 04/15/2016  . Chronic pain 11/30/2011     No outpatient medications have been marked as taking for the 04/02/20 encounter (Appointment) with Dawn Reid, PA-C.     Allergies:  No Known Allergies   ROS:  See above HPI for pertinent positives and negatives   Objective:   There were no vitals taken for this visit.  (if some vitals are omitted, this means that patient was UNABLE to obtain them even though they were asked to get  them prior to OV today.  They were asked to call us at their earliest convenience with these once obtained. ) General: A & O * 3; sounds in no acute distress; in usual state of health.  Skin: Pt confirms warm and dry extremities and pink fingertips HEENT: Pt confirms lips non-cyanotic Chest: Patient confirms normal chest excursion and movement Respiratory: speaking in full sentences, no conversational dyspnea; patient confirms no use of accessory muscles Psych: insight appears good, mood- appears full

## 2020-04-02 ENCOUNTER — Telehealth: Payer: Medicare HMO | Admitting: Physician Assistant

## 2020-04-06 NOTE — Progress Notes (Signed)
Telehealth office visit note for Dawn Reid, PA-C- at Primary Care at [facility]   I connected with current patient today by telephone and verified that I am speaking with the correct person   . Location of the patient: Home . Location of the provider: Office - This visit type was conducted due to national recommendations for restrictions regarding the COVID-19 Pandemic (e.g. social distancing) in an effort to limit this patient's exposure and mitigate transmission in our community.    - No physical exam could be performed with this format, beyond that communicated to [place] by the patient/ family members as noted.   - Additionally my office staff/ schedulers were to discuss with the patient that there may be a monetary charge related to this service, depending on their medical insurance.  My understanding is that patient understood and consented to proceed.     _________________________________________________________________________________   History of Present Illness: Pt calls in to follow-up on hypertension. She was started on HCTZ 02/2020. Taking medication as directed without side effects. States blood pressure is much better. Her readings have ranged from 113-124/78-81. Denies chest pain, palpitations, dizziness or lower extremity swelling.    No flowsheet data found.  Depression screen [facility] 2/9 04/07/2020 02/20/2020 08/28/2019 08/14/2019 12/25/2018  Decreased Interest 0 0 0 0 0  Down, Depressed, Hopeless 0 0 0 0 0  PHQ - 2 Score 0 0 0 0 0  Altered sleeping 0 0 0 0 0  Tired, decreased energy 0 0 0 0 0  Change in appetite 0 0 0 0 0  Feeling bad or failure about yourself  0 0 0 0 0  Trouble concentrating 0 0 0 0 0  Moving slowly or fidgety/restless 0 0 0 0 0  Suicidal thoughts 0 0 0 0 0  PHQ-9 Score 0 0 0 0 0  Difficult doing work/chores - - Not difficult at all - Not difficult at all      Impression and Recommendations:     1. Elevated blood pressure reading- h/o HTN      Hypertension: - BP today is 121/77, at goal. - Continue HCTZ 25 mg. - Continue ambulatory BP and pulse monitoring. - Follow DASH diet. - Encourage to stay as active as possible.    - As part of my medical decision making, I reviewed the following data within the [place] History obtained from pt /family, CMA notes reviewed and incorporated if applicable, Labs reviewed, Radiograph/ tests reviewed if applicable and OV notes from prior OV's with me, as well as any other specialists she/he has seen since seeing me last, were all reviewed and used in my medical decision making process today.    - Additionally, when appropriate, discussion had with patient regarding our treatment plan, and their biases/concerns about that plan were used in my medical decision making today.    - The patient agreed with the plan and demonstrated an understanding of the instructions.   No barriers to understanding were identified.     - The patient was advised to call back or seek an in-person evaluation if the symptoms worsen or if the condition fails to improve as anticipated.   Return in about 4 months (around 08/07/2020) for HTN, HLD, hypothyroid, pre-dm.    No orders of the defined types were placed in this encounter.   No orders of the defined types were placed in this encounter.   Medications Discontinued During This Encounter  Medication Reason  . mirabegron  ER (MYRBETRIQ) 25 MG TB24 tablet        Time spent on visit including pre-visit chart review and post-visit care was 7 minutes.      The [place] was signed into law in 2016 which includes the topic of electronic health records.  This provides immediate access to information in MyChart.  This includes consultation notes, operative notes, office notes, lab results and pathology reports.  If you have any questions about what you read please let us know at your next visit or call us at the office.  We are  right here with you.   __________________________________________________________________________________     Patient Care Team    Relationship Specialty Notifications Start End  Dawn Alexander, [place] PCP - General   03/08/20   Salvadore Dom, MD Consulting Physician Obstetrics and Gynecology  03/16/17      -Vitals obtained; medications/ allergies reconciled;  personal medical, social, Sx etc.histories were updated by CMA, reviewed by me and are reflected in chart   Patient Active Problem List   Diagnosis Date Noted  . Elevated blood pressure reading 08/28/2019  . Incontinence in female 08/17/2019  . Urinary incontinence in female 12/25/2018  . Hyperlipidemia 12/25/2018  . History of non anemic vitamin B12 deficiency 03/13/2018  . Prediabetes 03/13/2018  . History of hypertension 03/13/2018  . 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 Bronchitis 01/29/2018  . Cellulitis 11/14/2017  . Uterine fibroid- noted on CT scan from 3\21\18 07/15/2017  . Degenerative disc disease, lumbar- noted on CT 01-25-17 07/15/2017  . Chronic hip pain- R 03/16/2017  . Urinary incontinence, urge 03/16/2017  . Diverticulosis of sigmoid colon 03/16/2017  . Contusion of right hip- fell 12/22/16 01/02/2017  . Greater trochanteric bursitis of right hip 09/23/2016  . Chronic Right lower extremity edema 07/01/2016  . Chronic foot pain 07/01/2016  . Venous (peripheral) insufficiency- B\L L Ext 07/01/2016  . Chronic pain in right foot 07/01/2016  . Increased vitamin B12 level due to excess supplements 05/17/2016  . Restless legs syndrome (RLS) 05/17/2016  . seasonal or environmental allergies 05/16/2016  . Insomnia 04/18/2016  . Vitamin D deficiency 04/18/2016  . Morbid obesity ([place]) 04/18/2016  . GERD (gastroesophageal reflux disease) 04/18/2016  . hypothyroidism 04/15/2016  . Chronic pain 11/30/2011     Current Meds  Medication Sig  . Acetaminophen-Caffeine (EXCEDRIN ASPIRIN FREE PO) Take by  mouth daily as needed.  . Ascorbic Acid (VITAMIN C) 1000 MG tablet Take 2,000 mg by mouth daily.  Marland Kitchen atorvastatin (LIPITOR) 20 MG tablet Take 1 tablet (20 mg total) by mouth at bedtime.  . B Complex Vitamins (B COMPLEX-B12 PO) Take by mouth.  . Cholecalciferol (VITAMIN D3) 125 MCG (5000 UT) CAPS Take 1 capsule by mouth daily.  . hydrochlorothiazide (HYDRODIURIL) 25 MG tablet Take 1 tablet (25 mg total) by mouth daily.  Marland Kitchen levothyroxine (SYNTHROID) 100 MCG tablet Take 1 tablet (100 mcg total) by mouth daily before breakfast.  . MAGNESIUM PO Take 1 tablet by mouth daily.  . vitamin B-12 (CYANOCOBALAMIN) 500 MCG tablet Take 500 mcg by mouth daily.     Allergies:  Allergies  Allergen Reactions  . Other Rash     ROS:  See above HPI for pertinent positives and negatives   Objective:   Blood pressure 121/77.  (if some vitals are omitted, this means that patient was UNABLE to obtain them even though they were asked to get them prior to OV today.  They were asked to call  us at their earliest convenience with these once obtained. ) General: A & O * 3; sounds in no acute distress; in usual state of health.  Respiratory: speaking in full sentences, no conversational dyspnea; patient confirms no use of accessory muscles Psych: insight appears good, mood- appears full

## 2020-04-07 ENCOUNTER — Encounter: Payer: Self-pay | Admitting: Physician Assistant

## 2020-04-07 ENCOUNTER — Telehealth (INDEPENDENT_AMBULATORY_CARE_PROVIDER_SITE_OTHER): Payer: Medicare HMO | Admitting: Physician Assistant

## 2020-04-07 ENCOUNTER — Other Ambulatory Visit: Payer: Self-pay

## 2020-04-07 VITALS — BP 121/77

## 2020-04-07 DIAGNOSIS — R030 Elevated blood-pressure reading, without diagnosis of hypertension: Secondary | ICD-10-CM | POA: Diagnosis not present

## 2020-04-27 NOTE — Progress Notes (Signed)
Virtual Visit via Video Note  I connected with Dawn Alexander on 04/28/20 at 10:30 AM EDT by a video enabled telemedicine application and verified that I am speaking with the correct person using two identifiers.   I discussed the limitations of evaluation and management by telemedicine and the availability of in person appointments. The patient expressed understanding and agreed to proceed.  Patient location: home Provider locations: office  Subjective:    CC: right lower extremity redness  HPI: Pleasant 64 year old female presenting via MyChart video visit with reports of right lower extremity redness and tenderness.  Approximately 1-1/2 years ago she was seen by Dr. Raliegh Scarlet for a black spot on her right shin that was accompanied by erythema and tenderness.  At that time it was determined she had cellulitis and she was given antibiotics for 10 days and that advised to keep her leg elevated.  Her symptoms resolved for the most part but the black spot never went away on her right shin.  A few days ago she tripped and hit her right shin and knee.  On evaluating her knee/shin she noticed that the area around the black spot has now become erythematous and is tender to touch.  No swelling or areas of fluctuance.  Denies fevers, chills, and myalgias.  Reports she was told that she waited too long with this issue previously so she wanted to catch it early this time around.   Past medical history, Surgical history, Family history not pertinant except as noted below, Social history, Allergies, and medications have been entered into the medical record, reviewed, and corrections made.   Review of Systems: See HPI for pertinent positives and negatives.   Objective:    General: Speaking clearly in complete sentences without any shortness of breath.  Alert and oriented x3.  Normal judgment. No apparent acute distress.  Attempted to evaluate right lower extremity erythema via video camera during visit but  unfortunately the lighting and camera resolution were not helpful.  Asked patient to send an image via MyChart to see if that gives [place] a better view.  Impression and Recommendations:    1. Cellulitis of right lower extremity Given similarity in symptoms to a year and a half ago, will go ahead and treat empirically for right lower extremity cellulitis.  Sending in Keflex 500 mg 3 times daily x10 days.  May use Tylenol/ibuprofen as needed for discomfort.  Advised patient to keep her leg elevated as much as possible and monitor the site for worsening of redness, development of swelling/fluctuance.  If any of these symptoms develop or if she has fever/chills/myalgias, advised her to return for further evaluation.  Return if symptoms worsen or fail to improve.  20 minutes of non-face-to-face time was provided during this encounter.  I discussed the assessment and treatment plan with the patient. The patient was provided an opportunity to ask questions and all were answered. The patient agreed with the plan and demonstrated an understanding of the instructions.   The patient was advised to call back or seek an in-person evaluation if the symptoms worsen or if the condition fails to improve as anticipated.  Clearnce Sorrel, DNP, APRN, FNP-BC [facility]

## 2020-04-28 ENCOUNTER — Telehealth: Payer: Self-pay | Admitting: Physician Assistant

## 2020-04-28 ENCOUNTER — Telehealth (INDEPENDENT_AMBULATORY_CARE_PROVIDER_SITE_OTHER): Payer: Medicare HMO | Admitting: Medical-Surgical

## 2020-04-28 ENCOUNTER — Other Ambulatory Visit: Payer: Self-pay

## 2020-04-28 ENCOUNTER — Encounter: Payer: Self-pay | Admitting: Medical-Surgical

## 2020-04-28 DIAGNOSIS — L03115 Cellulitis of right lower limb: Secondary | ICD-10-CM

## 2020-04-28 MED ORDER — CEPHALEXIN 500 MG PO CAPS: 500.0000 mg | ORAL_CAPSULE | Freq: Three times a day (TID) | ORAL | 0 refills | Status: DC

## 2020-04-28 NOTE — Telephone Encounter (Signed)
levothyroxine (SYNTHROID) 100 MCG tablet [[id number]]    Order Details Dose: 100 mcg Route: Oral Frequency: Daily before breakfast  Dispense Quantity: 90 tablet Refills: 1        Sig: Take 1 tablet (100 mcg total) by mouth daily before breakfast.       Start Date: 02/20/20 End Date: --  Written Date: 02/20/20 Expiration Date: 02/19/21     Diagnosis Association: hypothyroidism  Original Order:  levothyroxine (SYNTHROID) 100 MCG tablet [[id number]]  Providers  Authorizing Provider: [name], DO NPI: [id number]   DEA #: [id number]  Ordering User:  [name], [facility] - [place], [place]  [place], [place] [address]  Phone:  [phone]  Fax:  [phone]  DEA #:  --     Order Class  Normal  Lab Test Results  Component Date/Time Value Range & Unit Status  T3, Total 08/14/19 0904 85 71 - 180 ng/dL Final  TSH 08/14/19 0904 5.590 (H) 0.450 - 4.500 uIU/mL Final   12/25/18 0936 4.330 0.450 - 4.500 uIU/mL Final   05/18/17 0834 3.760 0.450 - 4.500 uIU/mL Final  All Administrations of levothyroxine (SYNTHROID) 100 MCG tablet   (suggestion)  The administrations shown are only for this specific order and not for other orders for the same medication that may be in this encounter.  No Administrations Recorded         Med Administrations and Associated Flowsheet Values (last 96 hours)  None  Warnings History  No Interaction Warnings Shown   Order History Outpatient Date/Time Action Taken User Additional Information  02/20/20 [place], [place]   02/20/20 1443 Sign [name], DO Reorder from [id number]  02/20/20 [place], [place] [id number]  04/07/20 0824 Taking [address] Fonnie Mu, CMA   04/28/20 0677 Taking Flag Checked Darcie Mellone, Patrecia Pace, CMA   Tracking Links Cosign Tracking Order Transmittal Tracking  Outpatient Medication  Detail   Disp Refills Start End   levothyroxine (SYNTHROID) 100 MCG tablet 90 tablet 1 02/20/2020    Sig - Route: Take 1 tablet (100 mcg total) by mouth daily before breakfast. - Oral   Sent to pharmacy as: levothyroxine (SYNTHROID) 100 MCG tablet   E-Prescribing Status: Receipt confirmed by pharmacy (02/20/2020  2:43 PM EDT)    Patient advised that refill was sent 02/2020 for #90 with 1 refill. Patient verbalized understanding. AS, CMA

## 2020-04-28 NOTE — Telephone Encounter (Signed)
Patient called stating she contacted [facility] for a thyroid med refill and the only one on file was for a 15 day supply. Patient contacted our office about this and I advised patient that in April 2021 previous PCP (Dr. Jenetta Downer) placed a 6 mnth supply for her, so she should be good until Oct 2021. Patient is requesting a review to make sure order was placed correctly to her pharm.

## 2020-05-05 ENCOUNTER — Telehealth: Payer: Self-pay | Admitting: Physician Assistant

## 2020-05-05 NOTE — Telephone Encounter (Signed)
LVM for pt to call to discuss.  T. Briseis Aguilera, CMA  

## 2020-05-05 NOTE — Telephone Encounter (Signed)
Pt says was told to call office if leg is not better ( she did a virtual OV w/ Joy frm [facility])  --Per Patient leg isn't any better.  Pt ask for you to call her back at [phone]  --glh

## 2020-05-06 ENCOUNTER — Ambulatory Visit (INDEPENDENT_AMBULATORY_CARE_PROVIDER_SITE_OTHER): Payer: Medicare HMO

## 2020-05-06 ENCOUNTER — Other Ambulatory Visit: Payer: Self-pay

## 2020-05-06 ENCOUNTER — Ambulatory Visit
Admission: RE | Admit: 2020-05-06 | Discharge: 2020-05-06 | Disposition: A | Discharge status: Home / Self Care | Payer: Medicare HMO | Source: Ambulatory Visit | Attending: Physician Assistant | Admitting: Physician Assistant

## 2020-05-06 VITALS — BP 123/84 | HR 110 | Temp 98.6°F | Resp 18

## 2020-05-06 DIAGNOSIS — M79661 Pain in right lower leg: Secondary | ICD-10-CM | POA: Diagnosis not present

## 2020-05-06 DIAGNOSIS — M7989 Other specified soft tissue disorders: Secondary | ICD-10-CM | POA: Diagnosis not present

## 2020-05-06 DIAGNOSIS — M79604 Pain in right leg: Secondary | ICD-10-CM | POA: Diagnosis not present

## 2020-05-06 DIAGNOSIS — R600 Localized edema: Secondary | ICD-10-CM | POA: Diagnosis not present

## 2020-05-06 NOTE — Telephone Encounter (Signed)
Patient is aware of the below and verbalized understanding and was upset we could not see her in office. AS, CMA

## 2020-05-06 NOTE — Telephone Encounter (Signed)
Patient had video visit on 6/22 with Joy for cellulitis. States leg is still red and very tender.   Patient given Cephelexin 500mg  1 po tid.    Please advise. AS, CMA

## 2020-05-06 NOTE — Discharge Instructions (Signed)
Continue keflex until you finish. Elevation, compression stockings to help with swelling. Follow up with PCP for further evaluation.

## 2020-05-06 NOTE — ED Triage Notes (Signed)
Pt presents to [facility] for assessment of right lower leg redness, swelling, and pain beginning 1 week ago.  Did a tele visit with her PCP who put her on cephalexin.  Patient states it is not improving.  Bruising noted to leg, patient states fall 2 weeks ago.  Denies use of blood thinners, denies hx of blood clots.

## 2020-05-06 NOTE — Telephone Encounter (Signed)
Patient was started on appropriate antibiotic and leg should be improving given it has been 1 week. Recommend in person evaluation to visualize leg and given no appointments available suggesting going to UC.

## 2020-05-06 NOTE — ED Notes (Signed)
Patient able to ambulate independently  

## 2020-05-06 NOTE — ED Provider Notes (Signed)
[facility]    CSN: [id number] Arrival date & time: 05/06/20  1834      History   Chief Complaint Chief Complaint  Patient presents with  . Appointment  . Leg Pain    HPI Dawn Alexander is a 64 y.o. female.   64 year old female with history of HLD, RLS, chronic right leg swelling, comes in for 1 week history of right leg redness, swelling, pain. Patient states has history of right leg cellulitis, and was once told of "bone infection", though no imaging obtained. States this started with having a dark round lesion to the leg. 1 week ago, noticed the dark lesion returned with surrounding erythema, and had E visit with PCP, who started her on keflex TID x 10 days. She has been taking for 7 days without improvement and therefore came in for evaluation. Of note, she fell 2 weeks ago, causing contusion to the leg that has not completely resolved. She has right leg swelling that improves with elevation, and worsens throughout the day with walking/standing. Denies calf pain. Denies spreading erythema, warmth. Denies fevers.      Past Medical History:  Diagnosis Date  . Allergy   . Chronic lower back pain   . Fibroid   . Insomnia   . Restless leg syndrome   . Thyroid disease   . Vitamin D deficiency     Patient Active Problem List   Diagnosis Date Noted  . Elevated blood pressure reading 08/28/2019  . Incontinence in female 08/17/2019  . Urinary incontinence in female 12/25/2018  . Hyperlipidemia 12/25/2018  . History of non anemic vitamin B12 deficiency 03/13/2018  . Prediabetes 03/13/2018  . History of hypertension 03/13/2018  . 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 Bronchitis 01/29/2018  . Cellulitis 11/14/2017  . Uterine fibroid- noted on CT scan from 3\21\18 07/15/2017  . Degenerative disc disease, lumbar- noted on CT 01-25-17 07/15/2017  . Chronic hip pain- R 03/16/2017  . Urinary incontinence, urge 03/16/2017  . Diverticulosis of sigmoid colon 03/16/2017   . Contusion of right hip- fell 12/22/16 01/02/2017  . Greater trochanteric bursitis of right hip 09/23/2016  . Chronic Right lower extremity edema 07/01/2016  . Chronic foot pain 07/01/2016  . Venous (peripheral) insufficiency- B\L L Ext 07/01/2016  . Chronic pain in right foot 07/01/2016  . Increased vitamin B12 level due to excess supplements 05/17/2016  . Restless legs syndrome (RLS) 05/17/2016  . seasonal or environmental allergies 05/16/2016  . Insomnia 04/18/2016  . Vitamin D deficiency 04/18/2016  . Morbid obesity ([place]) 04/18/2016  . GERD (gastroesophageal reflux disease) 04/18/2016  . hypothyroidism 04/15/2016  . Chronic pain 11/30/2011    Past Surgical History:  Procedure Laterality Date  . ELBOW SURGERY Right 1999    OB History    Gravida  2   Para  2   Term  2   Preterm      AB      Living  2     SAB      TAB      Ectopic      Multiple      Live Births  2            Home Medications    Prior to Admission medications   Medication Sig Start Date End Date Taking? Authorizing Provider  Acetaminophen-Caffeine (EXCEDRIN ASPIRIN FREE PO) Take by mouth daily as needed.    [provider]  Ascorbic Acid (VITAMIN C) 1000 MG tablet Take 2,000  mg by mouth daily.    [provider]  atorvastatin (LIPITOR) 20 MG tablet Take 1 tablet (20 mg total) by mouth at bedtime. 02/20/20   Opalski, Deborah, DO  B Complex Vitamins (B COMPLEX-B12 PO) Take by mouth.    [provider]  cephALEXin (KEFLEX) 500 MG capsule Take 1 capsule (500 mg total) by mouth 3 (three) times daily. 04/28/20   Samuel Bouche, NP  Cholecalciferol (VITAMIN D3) 125 MCG (5000 UT) CAPS Take 1 capsule by mouth daily.    [provider]  hydrochlorothiazide (HYDRODIURIL) 25 MG tablet Take 1 tablet (25 mg total) by mouth daily. 02/27/20   [name], DO  levothyroxine (SYNTHROID) 100 MCG tablet Take 1 tablet (100 mcg total) by mouth daily before breakfast.  02/20/20   Opalski, Neoma Laming, DO  MAGNESIUM PO Take 1 tablet by mouth in the morning and at bedtime.     [provider]  vitamin B-12 (CYANOCOBALAMIN) 500 MCG tablet Take 500 mcg by mouth daily.    [provider]    Family History Family History  Problem Relation Age of Onset  . Diabetes Mother   . COPD Mother   . Cancer Mother        lung  . Multiple sclerosis Father   . Heart disease Father   . Cancer Sister        lung  . Stroke Sister   . Cancer Brother        bladder  . Stroke Sister   . Leukemia Sister   . Alcohol abuse Brother   . Healthy Brother   . Healthy Daughter   . Leukemia Brother 5    Social History Social History   Tobacco Use  . Smoking status: Never Smoker  . Smokeless tobacco: Never Used  Vaping Use  . Vaping Use: Never used  Substance Use Topics  . Alcohol use: Yes    Alcohol/week: 5.0 standard drinks    Types: 5 Shots of liquor per week  . Drug use: No     Allergies   Clindamycin/lincomycin   Review of Systems Review of Systems  Reason unable to perform ROS: See HPI as above.     Physical Exam Triage Vital Signs ED Triage Vitals  Enc Vitals Group     BP 05/06/20 1855 123/84     Pulse Rate 05/06/20 1855 (!) 110     Resp 05/06/20 1855 18     Temp 05/06/20 1855 98.6 F (37 C)     Temp Source 05/06/20 1855 Oral     SpO2 05/06/20 1855 95 %     Weight --      Height --      Head Circumference --      Peak Flow --      Pain Score 05/06/20 1852 2     Pain Loc --      Pain Edu? --      Excl. in [place]? --    No data found.  Updated Vital Signs BP 123/84 (BP Location: Left Arm)   Pulse (!) 110   Temp 98.6 F (37 C) (Oral)   Resp 18   SpO2 95%   Visual Acuity Right Eye Distance:   Left Eye Distance:   Bilateral Distance:    Right Eye Near:   Left Eye Near:    Bilateral Near:     Physical Exam Constitutional:      General: She is not in acute distress.    Appearance:  Normal appearance. She is  well-developed. She is not toxic-appearing or diaphoretic.  HENT:     Head: Normocephalic and atraumatic.  Eyes:     Conjunctiva/sclera: Conjunctivae normal.     Pupils: Pupils are equal, round, and reactive to light.  Pulmonary:     Effort: Pulmonary effort is normal. No respiratory distress.     Comments: Speaking in full sentences without difficulty Musculoskeletal:     Cervical back: Normal range of motion and neck supple.     Comments: Diffuse swelling to anterior shin without obvious swelling to the calf. Erythema along medial shin without warmth, induration, fluctuance. Contusions along lateral shin up to the knee. 1+  Pitting edema. No tenderness to the calf, negative homans. Full ROM of BLE. Pedal pulse 2+  Skin:    General: Skin is warm and dry.  Neurological:     Mental Status: She is alert and oriented to person, place, and time.      UC Treatments / Results  Labs (all labs ordered are listed, but only abnormal results are displayed) Labs Reviewed - No data to display  EKG   Radiology DG Tibia/Fibula Right  Result Date: 05/06/2020 CLINICAL DATA:  Pain and swelling EXAM: RIGHT TIBIA AND FIBULA - 2 VIEW COMPARISON:  None. FINDINGS: No fracture or malalignment. No periostitis or bone destruction. Edema within the lower leg. Questionable gas within the soft tissues of the distal lower leg and ankle. IMPRESSION: 1. No acute osseous abnormality. 2. Questionable gas within the soft tissues of the distal lower leg and ankle. CT could be obtained to confirm or exclude. These results will be called to the ordering clinician or representative by the Radiologist Assistant, and communication documented in the PACS or [employer]. Electronically Signed   By: Donavan Foil M.D.   On: 05/06/2020 19:33    Procedures Procedures (including critical care time)  Medications Ordered in UC Medications - No data to display  Initial Impression / Assessment and Plan / UC Course  I  have reviewed the triage vital signs and the nursing notes.  Pertinent labs & imaging results that were available during my care of the patient were reviewed by me and considered in my medical decision making (see chart for details).    Discussed xray results with Dr Lanny Cramp. Patient afebrile, nontoxic. Erythema has not spread since symptom onset, without warmth, induration. No calf pain, negative Homans. Will have patient finish keflex and follow up with PCP for further evaluation and possible CT given xray results, return precautions given. Patient expresses understanding and agrees to plan.  Case discussed with Dr Lanny Cramp, who agrees to plan.  Final Clinical Impressions(s) / UC Diagnoses   Final diagnoses:  Right leg pain   ED Prescriptions    None     PDMP not reviewed this encounter.   Ok Edwards, PA-C 05/06/20 2138

## 2020-05-11 ENCOUNTER — Other Ambulatory Visit: Payer: Self-pay | Admitting: Family Medicine

## 2020-05-25 ENCOUNTER — Telehealth: Payer: Self-pay | Admitting: Physician Assistant

## 2020-05-25 MED ORDER — HYDROCHLOROTHIAZIDE 25 MG PO TABS: 25.0000 mg | ORAL_TABLET | Freq: Every day | ORAL | 0 refills | Status: DC

## 2020-05-25 NOTE — Addendum Note (Signed)
Addended by: Mickel Crow on: 05/25/2020 01:20 PM   Modules accepted: Orders

## 2020-05-25 NOTE — Telephone Encounter (Signed)
Patient is requesting a refill of her hydrochlorothiazide, if approved please send to [facility]

## 2020-05-25 NOTE — Telephone Encounter (Signed)
Refill sent to requested pharmacy. AS, CMA 

## 2020-07-22 ENCOUNTER — Other Ambulatory Visit: Payer: Self-pay | Admitting: Family Medicine

## 2020-07-22 DIAGNOSIS — E079 Disorder of thyroid, unspecified: Secondary | ICD-10-CM

## 2020-07-28 ENCOUNTER — Telehealth: Payer: Self-pay | Admitting: Physician Assistant

## 2020-07-28 DIAGNOSIS — E079 Disorder of thyroid, unspecified: Secondary | ICD-10-CM

## 2020-07-28 MED ORDER — LEVOTHYROXINE SODIUM 100 MCG PO TABS: 100.0000 ug | ORAL_TABLET | Freq: Every day | ORAL | 0 refills | Status: DC

## 2020-07-28 MED ORDER — ATORVASTATIN CALCIUM 20 MG PO TABS
20.0000 mg | ORAL_TABLET | Freq: Every day | ORAL | 0 refills | Status: DC
Start: 2020-07-28 — End: 2020-10-08

## 2020-07-28 NOTE — Addendum Note (Signed)
Addended by: Mickel Crow on: 07/28/2020 09:50 AM   Modules accepted: Orders

## 2020-07-28 NOTE — Telephone Encounter (Signed)
Patient has scheduled her next OV but was not able to sync with our schedule until November, she is currently out of her atorvastatin and thyroid meds. She has been waiting for over a week for these refills to be authorized, I advised patient that request was sent to previous PCP and that I would send nurse refill request. If approved please send to [facility]

## 2020-07-28 NOTE — Telephone Encounter (Signed)
Refill sent to requested pharmacy. No further refills until patient is seen. AS, CMA

## 2020-08-07 ENCOUNTER — Other Ambulatory Visit: Payer: Self-pay | Admitting: Physician Assistant

## 2020-08-07 DIAGNOSIS — Z1231 Encounter for screening mammogram for malignant neoplasm of breast: Secondary | ICD-10-CM | POA: Diagnosis not present

## 2020-08-07 LAB — HM MAMMOGRAPHY

## 2020-08-19 ENCOUNTER — Other Ambulatory Visit: Payer: Medicare HMO

## 2020-08-19 DIAGNOSIS — Z20822 Contact with and (suspected) exposure to covid-19: Secondary | ICD-10-CM | POA: Diagnosis not present

## 2020-08-20 ENCOUNTER — Encounter: Payer: Self-pay | Admitting: Physician Assistant

## 2020-08-20 LAB — NOVEL CORONAVIRUS, NAA: SARS-CoV-2, NAA: NOT DETECTED

## 2020-08-20 LAB — SARS-COV-2, NAA 2 DAY TAT

## 2020-09-28 ENCOUNTER — Telehealth: Payer: Self-pay | Admitting: Physician Assistant

## 2020-09-28 NOTE — Telephone Encounter (Signed)
Patient was added to schedule today for "eye soreness". I called patient and suggested she call her ophthalmology doctor for treatment. Patient was agreeable. Apt was canceled. AS, CMA

## 2020-09-30 ENCOUNTER — Ambulatory Visit: Payer: Medicare HMO | Admitting: Physician Assistant

## 2020-10-08 ENCOUNTER — Ambulatory Visit (INDEPENDENT_AMBULATORY_CARE_PROVIDER_SITE_OTHER): Payer: Medicare HMO | Admitting: Physician Assistant

## 2020-10-08 ENCOUNTER — Other Ambulatory Visit: Payer: Self-pay

## 2020-10-08 ENCOUNTER — Encounter: Payer: Self-pay | Admitting: Physician Assistant

## 2020-10-08 VITALS — BP 130/86 | HR 103 | Ht 64.5 in | Wt 225.7 lb

## 2020-10-08 DIAGNOSIS — E119 Type 2 diabetes mellitus without complications: Secondary | ICD-10-CM

## 2020-10-08 DIAGNOSIS — E559 Vitamin D deficiency, unspecified: Secondary | ICD-10-CM | POA: Diagnosis not present

## 2020-10-08 DIAGNOSIS — E785 Hyperlipidemia, unspecified: Secondary | ICD-10-CM

## 2020-10-08 DIAGNOSIS — R030 Elevated blood-pressure reading, without diagnosis of hypertension: Secondary | ICD-10-CM

## 2020-10-08 DIAGNOSIS — Z23 Encounter for immunization: Secondary | ICD-10-CM

## 2020-10-08 DIAGNOSIS — Z780 Asymptomatic menopausal state: Secondary | ICD-10-CM

## 2020-10-08 DIAGNOSIS — R748 Abnormal levels of other serum enzymes: Secondary | ICD-10-CM | POA: Diagnosis not present

## 2020-10-08 DIAGNOSIS — E2839 Other primary ovarian failure: Secondary | ICD-10-CM | POA: Diagnosis not present

## 2020-10-08 DIAGNOSIS — Z0000 Encounter for general adult medical examination without abnormal findings: Secondary | ICD-10-CM | POA: Diagnosis not present

## 2020-10-08 DIAGNOSIS — E079 Disorder of thyroid, unspecified: Secondary | ICD-10-CM

## 2020-10-08 DIAGNOSIS — R7989 Other specified abnormal findings of blood chemistry: Secondary | ICD-10-CM

## 2020-10-08 MED ORDER — LEVOTHYROXINE SODIUM 100 MCG PO TABS: 100.0000 ug | ORAL_TABLET | Freq: Every day | ORAL | 1 refills | Status: DC

## 2020-10-08 MED ORDER — HYDROCHLOROTHIAZIDE 25 MG PO TABS: 25.0000 mg | ORAL_TABLET | Freq: Every day | ORAL | 1 refills | Status: DC

## 2020-10-08 MED ORDER — ATORVASTATIN CALCIUM 20 MG PO TABS: 20.0000 mg | ORAL_TABLET | Freq: Every day | ORAL | 1 refills | Status: DC

## 2020-10-08 NOTE — Progress Notes (Signed)
Subjective:   Dawn Alexander is a 64 y.o. female who presents for Medicare Annual (Subsequent) preventive examination.  Review of Systems    General:   No F/C, wt loss Pulm:   No DIB, SOB, pleuritic chest pain Card:  No CP, palpitations Abd:  No n/v/d or pain Ext:  No inc edema from baseline  Objective:    Today's Vitals   10/08/20 1000  BP: 130/86  Pulse: (!) 103  SpO2: 98%  Weight: 225 lb 11.2 oz (102.4 kg)  Height: 5' 4.5" (1.638 m)   Body mass index is 38.14 kg/m.  Advanced Directives 08/14/2019 05/17/2017 04/15/2016  Does Patient Have a Medical Advance Directive? No No No  Would patient like information on creating a medical advance directive? - No - Patient declined Yes - Educational materials given    Current Medications (verified) Outpatient Encounter Medications as of 10/08/2020  Medication Sig  . Acetaminophen-Caffeine (EXCEDRIN ASPIRIN FREE PO) Take by mouth daily as needed.  . Ascorbic Acid (VITAMIN C) 1000 MG tablet Take 2,000 mg by mouth daily.  Marland Kitchen atorvastatin (LIPITOR) 20 MG tablet Take 1 tablet (20 mg total) by mouth at bedtime. **NO FURTHER REFILLS UNTIL PATIENT IS SEEN**  . B Complex Vitamins (B COMPLEX-B12 PO) Take by mouth.  . Cholecalciferol (VITAMIN D3) 125 MCG (5000 UT) CAPS Take 1 capsule by mouth daily.  . hydrochlorothiazide (HYDRODIURIL) 25 MG tablet TAKE 1 TABLET EVERY DAY  . levothyroxine (SYNTHROID) 100 MCG tablet Take 1 tablet (100 mcg total) by mouth daily before breakfast. **NO FURTHER REFILLS UNTIL PATIENT IS SEEN**  . MAGNESIUM PO Take 1 tablet by mouth in the morning and at bedtime.   . vitamin B-12 (CYANOCOBALAMIN) 500 MCG tablet Take 500 mcg by mouth daily.  . cephALEXin (KEFLEX) 500 MG capsule Take 1 capsule (500 mg total) by mouth 3 (three) times daily.   No facility-administered encounter medications on file as of 10/08/2020.    Allergies (verified) Clindamycin/lincomycin   History: Past Medical History:  Diagnosis Date  .  Allergy   . Chronic lower back pain   . Fibroid   . Insomnia   . Restless leg syndrome   . Thyroid disease   . Vitamin D deficiency    Past Surgical History:  Procedure Laterality Date  . ELBOW SURGERY Right 1999   Family History  Problem Relation Age of Onset  . Diabetes Mother   . COPD Mother   . Cancer Mother        lung  . Multiple sclerosis Father   . Heart disease Father   . Cancer Sister        lung  . Stroke Sister   . Cancer Brother        bladder  . Stroke Sister   . Leukemia Sister   . Alcohol abuse Brother   . Healthy Brother   . Healthy Daughter   . Leukemia Brother 5   Social History   Socioeconomic History  . Marital status: Married    Spouse name: Not on file  . Number of children: Not on file  . Years of education: Not on file  . Highest education level: Not on file  Occupational History  . Not on file  Tobacco Use  . Smoking status: Never Smoker  . Smokeless tobacco: Never Used  Vaping Use  . Vaping Use: Never used  Substance and Sexual Activity  . Alcohol use: Yes    Alcohol/week: 5.0 standard drinks  Types: 5 Shots of liquor per week  . Drug use: No  . Sexual activity: Yes    Partners: Male    Birth control/protection: Post-menopausal  Other Topics Concern  . Not on file  Social History Narrative  . Not on file   Social Determinants of Health   Financial Resource Strain:   . Difficulty of Paying Living Expenses: Not on file  Food Insecurity:   . Worried About Charity fundraiser in the Last Year: Not on file  . Ran Out of Food in the Last Year: Not on file  Transportation Needs:   . Lack of Transportation (Medical): Not on file  . Lack of Transportation (Non-Medical): Not on file  Physical Activity:   . Days of Exercise per Week: Not on file  . Minutes of Exercise per Session: Not on file  Stress:   . Feeling of Stress : Not on file  Social Connections:   . Frequency of Communication with Friends and Family: Not on file   . Frequency of Social Gatherings with Friends and Family: Not on file  . Attends Religious Services: Not on file  . Active Member of Clubs or Organizations: Not on file  . Attends Archivist Meetings: Not on file  . Marital Status: Not on file    Tobacco Counseling Counseling given: Not Answered   Diabetic? Yes, controlled.    Activities of Daily Living In your present state of health, do you have any difficulty performing the following activities: 10/08/2020  Hearing? N  Vision? N  Difficulty concentrating or making decisions? N  Walking or climbing stairs? N  Dressing or bathing? N  Doing errands, shopping? N  Some recent data might be hidden    Patient Care Team: Lorrene Reid, PA-C as PCP - General Talbert Nan Francesca Jewett, MD as Consulting Physician (Obstetrics and Gynecology)  Indicate any recent Medical Services you may have received from other than [facility] providers in the past year (date may be approximate).     Assessment:   This is a routine wellness examination for Dawn Alexander.  Hearing/Vision screen No exam data present  Dietary issues and exercise activities discussed:  -Follow a heart healthy diet and monitor carbohydrates and glucose. Continue to stay as active as possible. Stay well hydrated.  Goals   None    Depression Screen PHQ 2/9 Scores 10/08/2020 04/28/2020 04/07/2020 02/20/2020 08/28/2019 08/14/2019 12/25/2018  PHQ - 2 Score 0 0 0 0 0 0 0  PHQ- 9 Score 0 0 0 0 0 0 0    Fall Risk Fall Risk  10/08/2020 08/14/2019 12/25/2018 11/09/2017 05/17/2017  Falls in the past year? 1 0 0 No Yes  Number falls in past yr: 0 - - - 2 or more  Injury with Fall? 1 - - - Yes  Follow up Falls evaluation completed - Falls evaluation completed - -    Any stairs in or around the home? Yes  If so, are there any without handrails? Yes  Home free of loose throw rugs in walkways, pet beds, electrical cords, etc? Yes  Adequate lighting in your home to reduce risk of  falls? Yes   ASSISTIVE DEVICES UTILIZED TO PREVENT FALLS:  Life alert? No  Use of a cane, walker or w/c? No  Grab bars in the bathroom? No  Shower chair or bench in shower? No  Elevated toilet seat or a handicapped toilet? No   TIMED UP AND GO:  Was the test performed? Yes .  Length of time to ambulate 10 feet: 10 sec.   Gait steady and fast with assistive device  Cognitive Function: wnl     6CIT Screen 10/08/2020 08/14/2019  What Year? 0 points 0 points  What month? 0 points 0 points  What time? 0 points 0 points  Count back from 20 0 points 0 points  Months in reverse 0 points 0 points  Repeat phrase 0 points 0 points  Total Score 0 0    Immunizations Immunization History  Administered Date(s) Administered  . Hepatitis B 08/03/2015, 12/31/2015, 03/15/2016  . Hepatitis B, ped/adol 08/03/2015  . Influenza,inj,Quad PF,6+ Mos 08/14/2019  . Influenza-Unspecified 06/28/2016  . Pneumococcal Conjugate-13 08/03/2015  . Pneumococcal Polysaccharide-23 08/28/2019  . Tdap 03/12/2014  . Zoster 08/03/2015  . Zoster Recombinat (Shingrix) 08/28/2019    TDAP status: Up to date Flu Vaccine status: Completed at today's visit Pneumococcal vaccine status: Up to date Covid-19 vaccine status: Declined, Education has been provided regarding the importance of this vaccine but patient still declined. Advised may receive this vaccine at local pharmacy or Health Dept.or vaccine clinic. Aware to provide a copy of the vaccination record if obtained from local pharmacy or Health Dept. Verbalized acceptance and understanding.  Qualifies for Shingles Vaccine? Yes   Zostavax completed Yes   Shingrix Completed?: Yes  Screening Tests Health Maintenance  Topic Date Due  . COVID-19 Vaccine (1) Never done  . HEMOGLOBIN A1C  02/12/2020  . INFLUENZA VACCINE  06/07/2020  . DEXA SCAN  04/07/2021 (Originally 08/26/2019)  . COLONOSCOPY  04/07/2021 (Originally 11/07/2018)  . Hepatitis C Screening   04/07/2021 (Originally 10-Mar-1956)  . MAMMOGRAM  08/07/2022  . TETANUS/TDAP  03/12/2024  . PNA vac Low Risk Adult  Completed  . FOOT EXAM  Discontinued  . OPHTHALMOLOGY EXAM  Discontinued  . URINE MICROALBUMIN  Discontinued    Health Maintenance  Health Maintenance Due  Topic Date Due  . COVID-19 Vaccine (1) Never done  . HEMOGLOBIN A1C  02/12/2020  . INFLUENZA VACCINE  06/07/2020    Colorectal cancer screening: Referral to GI placed PATIENT DECLINED. Pt aware the office will call re: appt. Mammogram status: Completed 08/07/2020. Repeat every year Bone Density status: Ordered today. Pt provided with contact info and advised to call to schedule appt.  Lung Cancer Screening: (Low Dose CT Chest recommended if Age 57-80 years, 30 pack-year currently smoking OR have quit w/in 15years.) does not qualify.   Lung Cancer Screening Referral:   Additional Screening:  Hepatitis C Screening: does qualify; Completed Patient declined  Vision Screening: Recommended annual ophthalmology exams for early detection of glaucoma and other disorders of the eye. Is the patient up to date with their annual eye exam?  Yes  Who is the provider or what is the name of the office in which the patient attends annual eye exams? Dr. Truman Hayward If pt is not established with a provider, would they like to be referred to a provider to establish care? No .   Dental Screening: Recommended annual dental exams for proper oral hygiene  Community Resource Referral / Chronic Care Management: CRR required this visit?  No   CCM required this visit?  No      Plan:  -Continue current medication regimen. -Will notify of lab results once available.  -Follow up in 4 months for BP, DM, HLD  I have personally reviewed and noted the following in the patient's chart:   . Medical and social history . Use of alcohol, tobacco or  illicit drugs  . Current medications and supplements . Functional ability and  status . Nutritional status . Physical activity . Advanced directives . List of other physicians . Hospitalizations, surgeries, and ER visits in previous 12 months . Vitals . Screenings to include cognitive, depression, and falls . Referrals and appointments  In addition, I have reviewed and discussed with patient certain preventive protocols, quality metrics, and best practice recommendations. A written personalized care plan for preventive services as well as general preventive health recommendations were provided to patient.

## 2020-10-08 NOTE — Patient Instructions (Signed)
Preventive Care 66 Years and Older, Female Preventive care refers to lifestyle choices and visits with your health care provider that can promote health and wellness. This includes:  A yearly physical exam. This is also called an annual well check.  Regular dental and eye exams.  Immunizations.  Screening for certain conditions.  Healthy lifestyle choices, such as diet and exercise. What can I expect for my preventive care visit? Physical exam Your health care provider will check:  Height and weight. These may be used to calculate body mass index (BMI), which is a measurement that tells if you are at a healthy weight.  Heart rate and blood pressure.  Your skin for abnormal spots. Counseling Your health care provider may ask you questions about:  Alcohol, tobacco, and drug use.  Emotional well-being.  Home and relationship well-being.  Sexual activity.  Eating habits.  History of falls.  Memory and ability to understand (cognition).  Work and work Statistician.  Pregnancy and menstrual history. What immunizations do I need?  Influenza (flu) vaccine  This is recommended every year. Tetanus, diphtheria, and pertussis (Tdap) vaccine  You may need a Td booster every 10 years. Varicella (chickenpox) vaccine  You may need this vaccine if you have not already been vaccinated. Zoster (shingles) vaccine  You may need this after age 66. Pneumococcal conjugate (PCV13) vaccine  One dose is recommended after age 66. Pneumococcal polysaccharide (PPSV23) vaccine  One dose is recommended after age 72. Measles, mumps, and rubella (MMR) vaccine  You may need at least one dose of MMR if you were born in 1957 or later. You may also need a second dose. Meningococcal conjugate (MenACWY) vaccine  You may need this if you have certain conditions. Hepatitis A vaccine  You may need this if you have certain conditions or if you travel or work in places where you may be exposed  to hepatitis A. Hepatitis B vaccine  You may need this if you have certain conditions or if you travel or work in places where you may be exposed to hepatitis B. Haemophilus influenzae type b (Hib) vaccine  You may need this if you have certain conditions. You may receive vaccines as individual doses or as more than one vaccine together in one shot (combination vaccines). Talk with your health care provider about the risks and benefits of combination vaccines. What tests do I need? Blood tests  Lipid and cholesterol levels. These may be checked every 5 years, or more frequently depending on your overall health.  Hepatitis C test.  Hepatitis B test. Screening  Lung cancer screening. You may have this screening every year starting at age 39 if you have a 30-pack-year history of smoking and currently smoke or have quit within the past 15 years.  Colorectal cancer screening. All adults should have this screening starting at age 36 and continuing until age 15. Your health care provider may recommend screening at age 23 if you are at increased risk. You will have tests every 1-10 years, depending on your results and the type of screening test.  Diabetes screening. This is done by checking your blood sugar (glucose) after you have not eaten for a while (fasting). You may have this done every 1-3 years.  Mammogram. This may be done every 1-2 years. Talk with your health care provider about how often you should have regular mammograms.  BRCA-related cancer screening. This may be done if you have a family history of breast, ovarian, tubal, or peritoneal cancers.  Other tests  Sexually transmitted disease (STD) testing.  Bone density scan. This is done to screen for osteoporosis. You may have this done starting at age 1. Follow these instructions at home: Eating and drinking  Eat a diet that includes fresh fruits and vegetables, whole grains, lean protein, and low-fat dairy products. Limit  your intake of foods with high amounts of sugar, saturated fats, and salt.  Take vitamin and mineral supplements as recommended by your health care provider.  Do not drink alcohol if your health care provider tells you not to drink.  If you drink alcohol: ? Limit how much you have to 0-1 drink a day. ? Be aware of how much alcohol is in your drink. In the [place] one drink equals one 12 oz bottle of beer (355 mL), one 5 oz glass of wine (148 mL), or one 1 oz glass of hard liquor (44 mL). Lifestyle  Take daily care of your teeth and gums.  Stay active. Exercise for at least 30 minutes on 5 or more days each week.  Do not use any products that contain nicotine or tobacco, such as cigarettes, e-cigarettes, and chewing tobacco. If you need help quitting, ask your health care provider.  If you are sexually active, practice safe sex. Use a condom or other form of protection in order to prevent STIs (sexually transmitted infections).  Talk with your health care provider about taking a low-dose aspirin or statin. What's next?  Go to your health care provider once a year for a well check visit.  Ask your health care provider how often you should have your eyes and teeth checked.  Stay up to date on all vaccines. This information is not intended to replace advice given to you by your health care provider. Make sure you discuss any questions you have with your health care provider. Document Revised: 10/18/2018 Document Reviewed: 10/18/2018 Elsevier Patient Education  [place]  A chalazion is a swelling or lump on the eyelid. It can affect the upper eyelid or the lower eyelid. What are the causes? This condition may be caused by:  Long-lasting (chronic) inflammation of the eyelid glands.  A blocked oil gland in the eyelid. What are the signs or symptoms? Symptoms of this condition include:  Swelling of the eyelid. The swelling may spread to areas around the  eye.  A hard lump on the eyelid.  Blurry vision. The lump on the eyelid may make it hard to see out of the eye. How is this diagnosed? This condition is diagnosed with an examination of the eye. How is this treated? This condition is treated by applying a warm compress to the eyelid. If the condition does not improve, it may be treated with:  Medicine that is injected into the chalazion by a health care provider.  Surgery.  Medicine that is applied to the eye. Follow these instructions at home: Managing pain and swelling  Apply a warm, moist compress to the eyelid 4-6 times a day for 10-15 minutes at a time. This will help to open any blocked glands and to reduce redness and swelling.  Apply over-the-counter and prescription medicines only as told by your health care provider. General instructions  Do not touch the chalazion.  Do not try to remove the pus. Do not squeeze the chalazion or stick it with a pin or needle.  Do not rub your eyes.  Wash your hands often. Dry your hands with a clean towel.  Keep your face, scalp, and eyebrows clean.  Avoid wearing eye makeup.  If the chalazion does not break open (rupture) on its own, return to your health care provider.  Keep all follow-up appointments as told by your health care provider. This is important. Contact a health care provider if:  Your eyelid has not improved in 4 weeks.  Your eyelid is getting worse.  You have a fever.  The chalazion does not rupture on its own after a month of home treatment. Get help right away if:  You have pain in your eye.  Your vision changes.  The chalazion becomes painful or red.  The chalazion gets bigger. Summary  A chalazion is a swelling or lump on the upper or lower eyelid.  It may be caused by chronic inflammation or a blocked oil gland.  Apply a warm, moist compress to the eyelid 4-6 times a day for 10-15 minutes at a time.  Keep your face, scalp, and eyebrows  clean. This information is not intended to replace advice given to you by your health care provider. Make sure you discuss any questions you have with your health care provider. Document Revised: 04/12/2018 Document Reviewed: 04/12/2018 Elsevier Patient Education  [place], also known as a hordeolum, is a bump that forms on an eyelid. It may look like a pimple next to the eyelash. A stye can form inside the eyelid (internal stye) or outside the eyelid (external stye). A stye can cause redness, swelling, and pain on the eyelid. Styes are very common. Anyone can get them at any age. They usually occur in just one eye, but you may have more than one in either eye. What are the causes? A stye is caused by an infection. The infection is almost always caused by bacteria called Staphylococcus aureus. This is a common type of bacteria that lives on the skin. An internal stye may result from an infected oil-producing gland inside the eyelid. An external stye may be caused by an infection at the base of the eyelash (hair follicle). What increases the risk? You are more likely to develop a stye if:  You have had a stye before.  You have any of these conditions: ? Diabetes. ? Red, itchy, inflamed eyelids (blepharitis). ? A skin condition such as seborrheic dermatitis or rosacea. ? High fat levels in your blood (lipids). What are the signs or symptoms? The most common symptom of a stye is eyelid pain. Internal styes are more painful than external styes. Other symptoms may include:  Painful swelling of your eyelid.  A scratchy feeling in your eye.  Tearing and redness of your eye.  Pus draining from the stye. How is this diagnosed? Your health care provider may be able to diagnose a stye just by examining your eye. The health care provider may also check to make sure:  You do not have a fever or other signs of a more serious infection.  The infection has not spread  to other parts of your eye or areas around your eye. How is this treated? Most styes will clear up in a few days without treatment or with warm compresses applied to the area. You may need to use antibiotic drops or ointment to treat an infection. In some cases, if your stye does not heal with routine treatment, your health care provider may drain pus from the stye using a thin blade or needle. This may be done if the stye  is large, causing a lot of pain, or affecting your vision. Follow these instructions at home:  Take over-the-counter and prescription medicines only as told by your health care provider. This includes eye drops or ointments.  If you were prescribed an antibiotic medicine, apply or use it as told by your health care provider. Do not stop using the antibiotic even if your condition improves.  Apply a warm, wet cloth (warm compress) to your eye for 5-10 minutes, 4 times a day.  Clean the affected eyelid as directed by your health care provider.  Do not wear contact lenses or eye makeup until your stye has healed.  Do not try to pop or drain the stye.  Do not rub your eye. Contact a health care provider if:  You have chills or a fever.  Your stye does not go away after several days.  Your stye affects your vision.  Your eyeball becomes swollen, red, or painful. Get help right away if:  You have pain when moving your eye around. Summary  A stye is a bump that forms on an eyelid. It may look like a pimple next to the eyelash.  A stye can form inside the eyelid (internal stye) or outside the eyelid (external stye). A stye can cause redness, swelling, and pain on the eyelid.  Your health care provider may be able to diagnose a stye just by examining your eye.  Apply a warm, wet cloth (warm compress) to your eye for 5-10 minutes, 4 times a day. This information is not intended to replace advice given to you by your health care provider. Make sure you discuss any  questions you have with your health care provider. Document Revised: 10/06/2017 Document Reviewed: 07/06/2017 Elsevier Patient Education  2020 [employer].

## 2020-10-09 LAB — COMPREHENSIVE METABOLIC PANEL
ALT: 24 IU/L (ref 0–32)
AST: 19 IU/L (ref 0–40)
Albumin/Globulin Ratio: 1.7 (ref 1.2–2.2)
Albumin: 4.4 g/dL (ref 3.8–4.8)
Alkaline Phosphatase: 88 IU/L (ref 44–121)
BUN/Creatinine Ratio: 15 (ref 12–28)
BUN: 15 mg/dL (ref 8–27)
Bilirubin Total: 0.6 mg/dL (ref 0.0–1.2)
CO2: 28 mmol/L (ref 20–29)
Calcium: 9.6 mg/dL (ref 8.7–10.3)
Chloride: 100 mmol/L (ref 96–106)
Creatinine, Ser: 0.99 mg/dL (ref 0.57–1.00)
GFR calc Af Amer: 69 mL/min/{1.73_m2} (ref >59)
GFR calc non Af Amer: 60 mL/min/{1.73_m2} (ref >59)
Globulin, Total: 2.6 g/dL (ref 1.5–4.5)
Glucose: 118 mg/dL — ABNORMAL HIGH (ref 65–99)
Potassium: 3.7 mmol/L (ref 3.5–5.2)
Sodium: 142 mmol/L (ref 134–144)
Total Protein: 7 g/dL (ref 6.0–8.5)

## 2020-10-09 LAB — LIPID PANEL
Chol/HDL Ratio: 2.7 ratio (ref 0.0–4.4)
Cholesterol, Total: 144 mg/dL (ref 100–199)
HDL: 53 mg/dL (ref >39)
LDL Chol Calc (NIH): 72 mg/dL (ref 0–99)
Triglycerides: 104 mg/dL (ref 0–149)
VLDL Cholesterol Cal: 19 mg/dL (ref 5–40)

## 2020-10-09 LAB — VITAMIN B12: Vitamin B-12: 1527 pg/mL — ABNORMAL HIGH (ref 232–1245)

## 2020-10-09 LAB — CBC
Hematocrit: 43.4 % (ref 34.0–46.6)
Hemoglobin: 14.2 g/dL (ref 11.1–15.9)
MCH: 27.8 pg (ref 26.6–33.0)
MCHC: 32.7 g/dL (ref 31.5–35.7)
MCV: 85 fL (ref 79–97)
Platelets: 252 10*3/uL (ref 150–450)
RBC: 5.1 x10E6/uL (ref 3.77–5.28)
RDW: 12.4 % (ref 11.7–15.4)
WBC: 5.2 10*3/uL (ref 3.4–10.8)

## 2020-10-09 LAB — HEMOGLOBIN A1C
Est. average glucose Bld gHb Est-mCnc: 137 mg/dL
Hgb A1c MFr Bld: 6.4 % — ABNORMAL HIGH (ref 4.8–5.6)

## 2020-10-09 LAB — TSH: TSH: 1.32 u[IU]/mL (ref 0.450–4.500)

## 2020-10-11 ENCOUNTER — Other Ambulatory Visit: Payer: Self-pay | Admitting: Physician Assistant

## 2020-10-11 DIAGNOSIS — E079 Disorder of thyroid, unspecified: Secondary | ICD-10-CM

## 2020-10-11 DIAGNOSIS — E785 Hyperlipidemia, unspecified: Secondary | ICD-10-CM

## 2020-10-21 IMAGING — DX DG TIBIA/FIBULA 2V*R*
2 series · 2 of 2 positions shown · non-contrast
Comparison: None.

CLINICAL DATA: Pain and swelling

EXAM:
RIGHT TIBIA AND FIBULA - 2 VIEW

[lower leg ap]
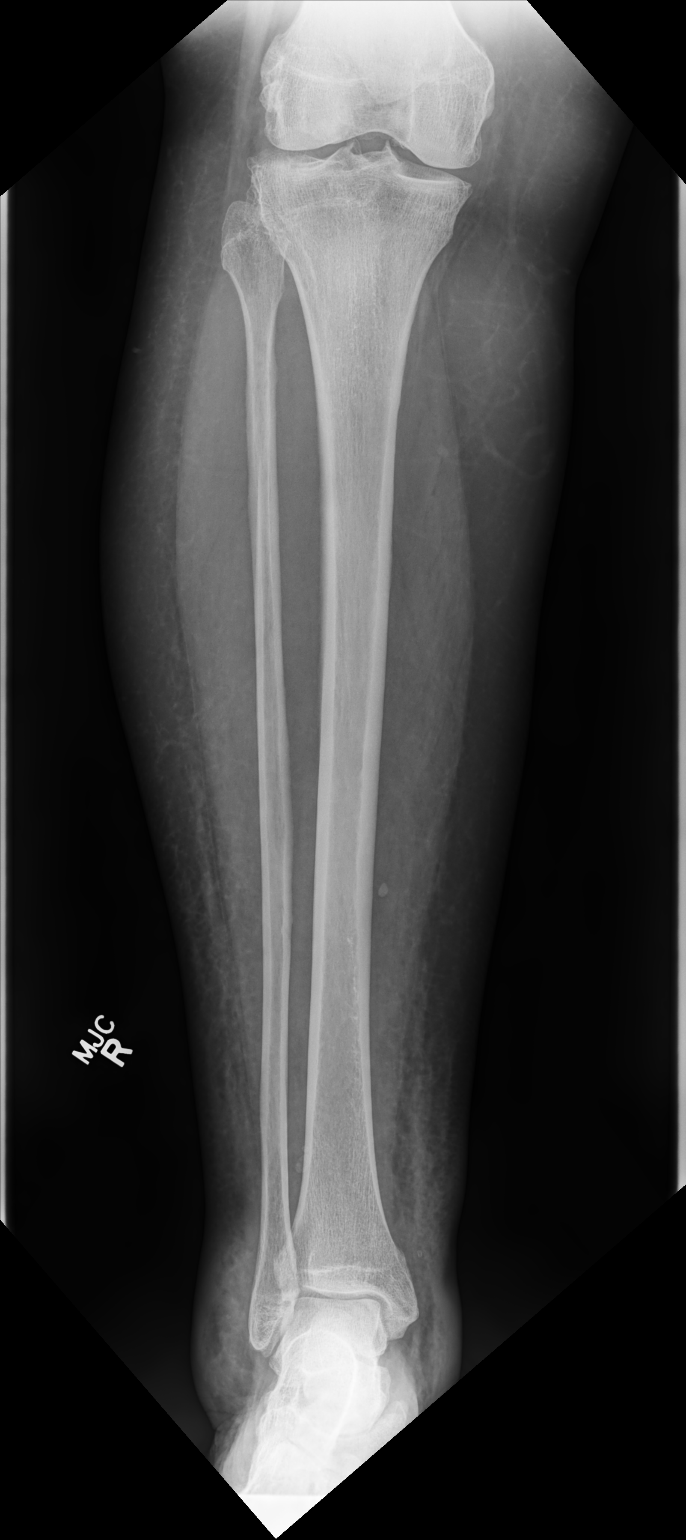

[lower leg lat]
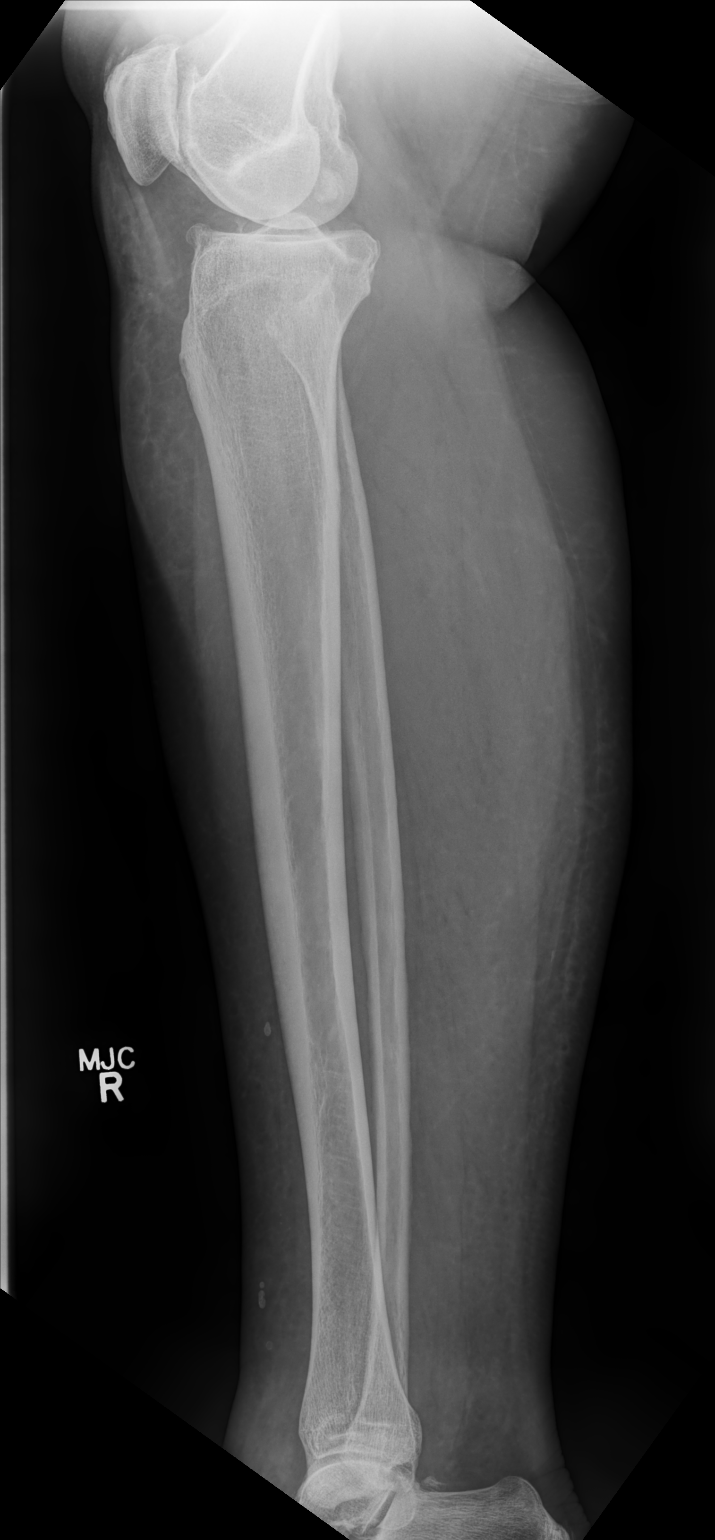

[2 of 2 positions shown; findings below may reference images not displayed]

FINDINGS: No fracture or malalignment. No periostitis or bone destruction.
Edema within the lower leg. Questionable gas within the soft tissues
of the distal lower leg and ankle.
IMPRESSION: 1. No acute osseous abnormality.
2. Questionable gas within the soft tissues of the distal lower leg
and ankle. CT could be obtained to confirm or exclude.

These results will be called to the ordering clinician or
representative by the Radiologist Assistant, and communication
documented in the PACS or [REDACTED].

## 2020-10-27 ENCOUNTER — Telehealth: Payer: Self-pay | Admitting: Physician Assistant

## 2020-10-27 NOTE — Telephone Encounter (Signed)
Error

## 2021-01-20 ENCOUNTER — Encounter: Payer: Self-pay | Admitting: Physician Assistant

## 2021-01-25 DIAGNOSIS — H5203 Hypermetropia, bilateral: Secondary | ICD-10-CM | POA: Diagnosis not present

## 2021-01-27 ENCOUNTER — Ambulatory Visit: Payer: Medicare HMO | Admitting: Physician Assistant

## 2021-01-28 DIAGNOSIS — H52209 Unspecified astigmatism, unspecified eye: Secondary | ICD-10-CM | POA: Diagnosis not present

## 2021-01-28 DIAGNOSIS — H5203 Hypermetropia, bilateral: Secondary | ICD-10-CM | POA: Diagnosis not present

## 2021-02-26 ENCOUNTER — Telehealth: Payer: Self-pay | Admitting: Physician Assistant

## 2021-02-26 ENCOUNTER — Other Ambulatory Visit: Payer: Self-pay | Admitting: Physician Assistant

## 2021-02-26 DIAGNOSIS — E785 Hyperlipidemia, unspecified: Secondary | ICD-10-CM

## 2021-02-26 NOTE — Telephone Encounter (Signed)
Please contact patient to schedule apt per last AVS for further med refills. AS, CMA 

## 2021-03-01 NOTE — Telephone Encounter (Signed)
Patient scheduled.

## 2021-03-11 ENCOUNTER — Other Ambulatory Visit: Payer: Self-pay | Admitting: Physician Assistant

## 2021-03-11 DIAGNOSIS — R030 Elevated blood-pressure reading, without diagnosis of hypertension: Secondary | ICD-10-CM

## 2021-03-19 ENCOUNTER — Encounter: Payer: Self-pay | Admitting: Physician Assistant

## 2021-03-19 ENCOUNTER — Other Ambulatory Visit: Payer: Self-pay

## 2021-03-19 ENCOUNTER — Ambulatory Visit (INDEPENDENT_AMBULATORY_CARE_PROVIDER_SITE_OTHER): Payer: Medicare HMO | Admitting: Physician Assistant

## 2021-03-19 VITALS — BP 111/74 | HR 64 | Temp 98.8°F | Ht 64.0 in | Wt 224.0 lb

## 2021-03-19 DIAGNOSIS — R600 Localized edema: Secondary | ICD-10-CM | POA: Diagnosis not present

## 2021-03-19 DIAGNOSIS — E785 Hyperlipidemia, unspecified: Secondary | ICD-10-CM

## 2021-03-19 DIAGNOSIS — J0100 Acute maxillary sinusitis, unspecified: Secondary | ICD-10-CM

## 2021-03-19 DIAGNOSIS — E559 Vitamin D deficiency, unspecified: Secondary | ICD-10-CM

## 2021-03-19 DIAGNOSIS — R030 Elevated blood-pressure reading, without diagnosis of hypertension: Secondary | ICD-10-CM | POA: Diagnosis not present

## 2021-03-19 DIAGNOSIS — Z0000 Encounter for general adult medical examination without abnormal findings: Secondary | ICD-10-CM

## 2021-03-19 DIAGNOSIS — E079 Disorder of thyroid, unspecified: Secondary | ICD-10-CM | POA: Diagnosis not present

## 2021-03-19 DIAGNOSIS — I10 Essential (primary) hypertension: Secondary | ICD-10-CM

## 2021-03-19 DIAGNOSIS — R7303 Prediabetes: Secondary | ICD-10-CM | POA: Diagnosis not present

## 2021-03-19 MED ORDER — AMOXICILLIN-POT CLAVULANATE 875-125 MG PO TABS: 1.0000 | ORAL_TABLET | Freq: Two times a day (BID) | ORAL | 0 refills | Status: DC

## 2021-03-19 NOTE — Progress Notes (Signed)
Established Patient Office Visit  Subjective:  Patient ID: Dawn Alexander, female    DOB: September 19, 1956  Age: 65 y.o. MRN: [id number]  CC:  Chief Complaint  Patient presents with  . Follow-up    HPI Dawn Alexander presents for follow up on hypertension, hyperlipidemia and thyroid. Patient has complaints of nasal congestion, headache, sinus pressure and postnasal drainage for more than 2 weeks.  Patient reports has tried Synex nasal spray with minimal relief.  Denies fever, sore throat, runny nose, chills or recent contact exposures.  HTN: Pt denies chest pain, palpitations, dizziness or leg swelling. Taking medication as directed without side effects.  Has not checked her blood pressure at home recently, reports has been taking care of her mother out of state. Pt follows a low salt diet.  HLD: Pt taking medication as directed without issues. Denies side effects including myalgias and RUQ pain.  States has not been diligent with monitoring diet or activity.  Hypothyroidism: Denies heat/cold intolerance, fatigue, or bowel changes.  Reports medication compliance.   Past Medical History:  Diagnosis Date  . Allergy   . Chronic lower back pain   . Fibroid   . Insomnia   . Restless leg syndrome   . Thyroid disease   . Vitamin D deficiency     Past Surgical History:  Procedure Laterality Date  . ELBOW SURGERY Right 1999    Family History  Problem Relation Age of Onset  . Diabetes Mother   . COPD Mother   . Cancer Mother        lung  . Multiple sclerosis Father   . Heart disease Father   . Cancer Sister        lung  . Stroke Sister   . Cancer Brother        bladder  . Stroke Sister   . Leukemia Sister   . Alcohol abuse Brother   . Healthy Brother   . Healthy Daughter   . Leukemia Brother 5    Social History   Socioeconomic History  . Marital status: Married    Spouse name: Not on file  . Number of children: Not on file  . Years of education: Not on file  .  Highest education level: Not on file  Occupational History  . Not on file  Tobacco Use  . Smoking status: Never Smoker  . Smokeless tobacco: Never Used  Vaping Use  . Vaping Use: Never used  Substance and Sexual Activity  . Alcohol use: Yes    Alcohol/week: 5.0 standard drinks    Types: 5 Shots of liquor per week  . Drug use: No  . Sexual activity: Yes    Partners: Male    Birth control/protection: Post-menopausal  Other Topics Concern  . Not on file  Social History Narrative  . Not on file   Social Determinants of Health   Financial Resource Strain: Not on file  Food Insecurity: Not on file  Transportation Needs: Not on file  Physical Activity: Not on file  Stress: Not on file  Social Connections: Not on file  Intimate Partner Violence: Not on file    Outpatient Medications Prior to Visit  Medication Sig Dispense Refill  . Acetaminophen-Caffeine (EXCEDRIN ASPIRIN FREE PO) Take by mouth daily as needed.    . Ascorbic Acid (VITAMIN C) 1000 MG tablet Take 2,000 mg by mouth daily.    Marland Kitchen atorvastatin (LIPITOR) 20 MG tablet Take 1 tablet (20 mg total) by mouth daily. **NEEDS APT  FOR REFILLS** 60 tablet 0  . B Complex Vitamins (B COMPLEX-B12 PO) Take by mouth.    . Cholecalciferol (VITAMIN D3) 125 MCG (5000 UT) CAPS Take 1 capsule by mouth daily.    . hydrochlorothiazide (HYDRODIURIL) 25 MG tablet TAKE 1 TABLET EVERY DAY 90 tablet 0  . levothyroxine (SYNTHROID) 100 MCG tablet Take 1 tablet (100 mcg total) by mouth daily before breakfast. 90 tablet 1  . MAGNESIUM PO Take 1 tablet by mouth in the morning and at bedtime.     . vitamin B-12 (CYANOCOBALAMIN) 500 MCG tablet Take 500 mcg by mouth daily.     No facility-administered medications prior to visit.    Allergies  Allergen Reactions  . Clindamycin/Lincomycin Rash    ROS Review of Systems A fourteen system review of systems was performed and found to be positive as per HPI.   Objective:    Physical Exam General:   Well Developed, well nourished, appropriate for stated age.  Neuro:  Alert and oriented,  extra-ocular muscles intact  HEENT:  Normocephalic, atraumatic, tenderness to maxillary sinus, no tenderness to frontal sinus, normal TM's of both ears, slight boggy turbinates with discharge, mildly erythematous posterior oropharynx w/o exudates, neck supple, +cervical adenopathy  Skin:  no gross rash, warm, pink. Cardiac:  RRR, S1 S2 Respiratory:  ECTA B/L w/o wheezing, crackles or rales, Not using accessory muscles, speaking in full sentences- unlabored. Vascular:  Ext warm, no cyanosis apprec.; cap RF less 2 sec. +trace edema, R>L Psych:  No HI/SI, judgement and insight good, Euthymic mood. Full Affect.    BP 111/74   Pulse 64   Temp 98.8 F (37.1 C)   Ht 5\' 4"  (1.626 m)   Wt 224 lb (101.6 kg)   SpO2 99%   BMI 38.45 kg/m  Wt Readings from Last 3 Encounters:  03/19/21 224 lb (101.6 kg)  10/08/20 225 lb 11.2 oz (102.4 kg)  02/20/20 234 lb (106.1 kg)     Health Maintenance Due  Topic Date Due  . COVID-19 Vaccine (1) Never done    There are no preventive care reminders to display for this patient.  Lab Results  Component Value Date   TSH 1.320 10/08/2020   Lab Results  Component Value Date   WBC 5.2 10/08/2020   HGB 14.2 10/08/2020   HCT 43.4 10/08/2020   MCV 85 10/08/2020   PLT 252 10/08/2020   Lab Results  Component Value Date   NA 142 10/08/2020   K 3.7 10/08/2020   CO2 28 10/08/2020   GLUCOSE 118 (H) 10/08/2020   BUN 15 10/08/2020   CREATININE 0.99 10/08/2020   BILITOT 0.6 10/08/2020   ALKPHOS 88 10/08/2020   AST 19 10/08/2020   ALT 24 10/08/2020   PROT 7.0 10/08/2020   ALBUMIN 4.4 10/08/2020   CALCIUM 9.6 10/08/2020   Lab Results  Component Value Date   CHOL 144 10/08/2020   Lab Results  Component Value Date   HDL 53 10/08/2020   Lab Results  Component Value Date   LDLCALC 72 10/08/2020   Lab Results  Component Value Date   TRIG 104 10/08/2020    Lab Results  Component Value Date   CHOLHDL 2.7 10/08/2020   Lab Results  Component Value Date   HGBA1C 6.4 (H) 10/08/2020      Assessment & Plan:   Problem List Items Addressed This Visit      Endocrine   hypothyroidism (Chronic)   Relevant Orders   CBC  Comprehensive metabolic panel   TSH   Lipid panel   Hemoglobin A1c     Other   Vitamin D deficiency (Chronic)   Relevant Orders   VITAMIN D 25 Hydroxy (Vit-D Deficiency, Fractures)   Prediabetes   Relevant Orders   CBC   Comprehensive metabolic panel   TSH   Lipid panel   Hemoglobin A1c   Hyperlipidemia - Primary   Relevant Orders   CBC   Comprehensive metabolic panel   TSH   Lipid panel   Hemoglobin A1c    Other Visit Diagnoses    Hypertension, unspecified type       Relevant Orders   CBC   Comprehensive metabolic panel   TSH   Lipid panel   Hemoglobin A1c   Healthcare maintenance       Relevant Orders   CBC   Comprehensive metabolic panel   TSH   Lipid panel   Hemoglobin A1c   Acute non-recurrent maxillary sinusitis       Relevant Medications   amoxicillin-clavulanate (AUGMENTIN) 875-125 MG tablet     HTN: -Controlled. -Continue current medication regimen. -Will collect CMP today for medication monitoring.  -Will continue to monitor.  HLD: -Last lipid panel wnl's, LDL 72 -Continue current medication regimen. -Recommend to follow a diet low in saturated and trans fats. -Will repeat lipid panel and hepatic function today.   Hypothyroidism: -Last TSH wnl -Continue current medication regimen. -Rechecking thyroid labs today. Pending results will make medication adjustments if indicated.  Sinusitis:  -Symptoms have been ongoing for >10 days with minimal improvement so will start antibiotic therapy.  -Recommend to use nasal spray (Nasocort or Flonase) and nasal rinses, especially before using nasal spray. -Follow-up if symptoms fail to improve or worsen.  Lower extremity  edema: -Recommend to continue low-sodium diet, wear compression socks and elevation. -Continue diuretic therapy.  Will collect CMP today for medication monitoring.  Meds ordered this encounter  Medications  . amoxicillin-clavulanate (AUGMENTIN) 875-125 MG tablet    Sig: Take 1 tablet by mouth 2 (two) times daily.    Dispense:  14 tablet    Refill:  0    Follow-up: Return in about 4 months (around 07/20/2021) for predm, HTN, HLD.   Note:  This note was prepared with assistance of Dragon voice recognition software. Occasional wrong-word or sound-a-like substitutions may have occurred due to the inherent limitations of voice recognition software.   Lorrene Reid, PA-C

## 2021-03-19 NOTE — Patient Instructions (Signed)
Prediabetes Eating Plan Prediabetes is a condition that causes blood sugar (glucose) levels to be higher than normal. This increases the risk for developing type 2 diabetes (type 2 diabetes mellitus). Working with a health care provider or nutrition specialist (dietitian) to make diet and lifestyle changes can help prevent the onset of diabetes. These changes may help you:  Control your blood glucose levels.  Improve your cholesterol levels.  Manage your blood pressure. What are tips for following this plan? Reading food labels  Read food labels to check the amount of fat, salt (sodium), and sugar in prepackaged foods. Avoid foods that have: ? Saturated fats. ? Trans fats. ? Added sugars.  Avoid foods that have more than 300 milligrams (mg) of sodium per serving. Limit your sodium intake to less than 2,300 mg each day. Shopping  Avoid buying pre-made and processed foods.  Avoid buying drinks with added sugar. Cooking  Cook with olive oil. Do not use butter, lard, or ghee.  Bake, broil, grill, steam, or boil foods. Avoid frying. Meal planning  Work with your dietitian to create an eating plan that is right for you. This may include tracking how many calories you take in each day. Use a food diary, notebook, or mobile application to track what you eat at each meal.  Consider following a Mediterranean diet. This includes: ? Eating several servings of fresh fruits and vegetables each day. ? Eating fish at least twice a week. ? Eating one serving each day of whole grains, beans, nuts, and seeds. ? Using olive oil instead of other fats. ? Limiting alcohol. ? Limiting red meat. ? Using nonfat or low-fat dairy products.  Consider following a plant-based diet. This includes dietary choices that focus on eating mostly vegetables and fruit, grains, beans, nuts, and seeds.  If you have high blood pressure, you may need to limit your sodium intake or follow a diet such as the DASH  (Dietary Approaches to Stop Hypertension) eating plan. The DASH diet aims to lower high blood pressure.   Lifestyle  Set weight loss goals with help from your health care team. It is recommended that most people with prediabetes lose 7% of their body weight.  Exercise for at least 30 minutes 5 or more days a week.  Attend a support group or seek support from a mental health counselor.  Take over-the-counter and prescription medicines only as told by your health care provider. What foods are recommended? Fruits Berries. Bananas. Apples. Oranges. Grapes. Papaya. Mango. Pomegranate. Kiwi. Grapefruit. Cherries. Vegetables Lettuce. Spinach. Peas. Beets. Cauliflower. Cabbage. Broccoli. Carrots. Tomatoes. Squash. Eggplant. Herbs. Peppers. Onions. Cucumbers. Brussels sprouts. Grains Whole grains, such as whole-wheat or whole-grain breads, crackers, cereals, and pasta. Unsweetened oatmeal. Bulgur. Barley. Quinoa. Brown rice. Corn or whole-wheat flour tortillas or taco shells. Meats and other proteins Seafood. Poultry without skin. Lean cuts of pork and beef. Tofu. Eggs. Nuts. Beans. Dairy Low-fat or fat-free dairy products, such as yogurt, cottage cheese, and cheese. Beverages Water. Tea. Coffee. Sugar-free or diet soda. Seltzer water. Low-fat or nonfat milk. Milk alternatives, such as soy or almond milk. Fats and oils Olive oil. Canola oil. Sunflower oil. Grapeseed oil. Avocado. Walnuts. Sweets and desserts Sugar-free or low-fat pudding. Sugar-free or low-fat ice cream and other frozen treats. Seasonings and condiments Herbs. Sodium-free spices. Mustard. Relish. Low-salt, low-sugar ketchup. Low-salt, low-sugar barbecue sauce. Low-fat or fat-free mayonnaise. The items listed above may not be a complete list of recommended foods and beverages. Contact a dietitian for more   information. What foods are not recommended? Fruits Fruits canned with syrup. Vegetables Canned vegetables. Frozen  vegetables with butter or cream sauce. Grains Refined white flour and flour products, such as bread, pasta, snack foods, and cereals. Meats and other proteins Fatty cuts of meat. Poultry with skin. Breaded or fried meat. Processed meats. Dairy Full-fat yogurt, cheese, or milk. Beverages Sweetened drinks, such as iced tea and soda. Fats and oils Butter. Lard. Ghee. Sweets and desserts Baked goods, such as cake, cupcakes, pastries, cookies, and cheesecake. Seasonings and condiments Spice mixes with added salt. Ketchup. Barbecue sauce. Mayonnaise. The items listed above may not be a complete list of foods and beverages that are not recommended. Contact a dietitian for more information. Where to find more information  American Diabetes Association: www.diabetes.org Summary  You may need to make diet and lifestyle changes to help prevent the onset of diabetes. These changes can help you control blood sugar, improve cholesterol levels, and manage blood pressure.  Set weight loss goals with help from your health care team. It is recommended that most people with prediabetes lose 7% of their body weight.  Consider following a Mediterranean diet. This includes eating plenty of fresh fruits and vegetables, whole grains, beans, nuts, seeds, fish, and low-fat dairy, and using olive oil instead of other fats. This information is not intended to replace advice given to you by your health care provider. Make sure you discuss any questions you have with your health care provider. Document Revised: 01/23/2020 Document Reviewed: 01/23/2020 Elsevier Patient Education  2021 Else[employer] Inc.  

## 2021-03-20 LAB — LIPID PANEL
Chol/HDL Ratio: 2.2 ratio (ref 0.0–4.4)
Cholesterol, Total: 136 mg/dL (ref 100–199)
HDL: 61 mg/dL (ref >39)
LDL Chol Calc (NIH): 55 mg/dL (ref 0–99)
Triglycerides: 115 mg/dL (ref 0–149)
VLDL Cholesterol Cal: 20 mg/dL (ref 5–40)

## 2021-03-20 LAB — COMPREHENSIVE METABOLIC PANEL
ALT: 19 IU/L (ref 0–32)
AST: 20 IU/L (ref 0–40)
Albumin/Globulin Ratio: 2 (ref 1.2–2.2)
Albumin: 4.5 g/dL (ref 3.8–4.8)
Alkaline Phosphatase: 80 IU/L (ref 44–121)
BUN/Creatinine Ratio: 18 (ref 12–28)
BUN: 15 mg/dL (ref 8–27)
Bilirubin Total: 0.5 mg/dL (ref 0.0–1.2)
CO2: 28 mmol/L (ref 20–29)
Calcium: 9.3 mg/dL (ref 8.7–10.3)
Chloride: 99 mmol/L (ref 96–106)
Creatinine, Ser: 0.83 mg/dL (ref 0.57–1.00)
Globulin, Total: 2.3 g/dL (ref 1.5–4.5)
Glucose: 107 mg/dL — ABNORMAL HIGH (ref 65–99)
Potassium: 3.3 mmol/L — ABNORMAL LOW (ref 3.5–5.2)
Sodium: 140 mmol/L (ref 134–144)
Total Protein: 6.8 g/dL (ref 6.0–8.5)
eGFR: 78 mL/min/{1.73_m2} (ref >59)

## 2021-03-20 LAB — CBC
Hematocrit: 43.9 % (ref 34.0–46.6)
Hemoglobin: 14.3 g/dL (ref 11.1–15.9)
MCH: 28.4 pg (ref 26.6–33.0)
MCHC: 32.6 g/dL (ref 31.5–35.7)
MCV: 87 fL (ref 79–97)
Platelets: 201 10*3/uL (ref 150–450)
RBC: 5.04 x10E6/uL (ref 3.77–5.28)
RDW: 12.4 % (ref 11.7–15.4)
WBC: 5 10*3/uL (ref 3.4–10.8)

## 2021-03-20 LAB — HEMOGLOBIN A1C
Est. average glucose Bld gHb Est-mCnc: 143 mg/dL
Hgb A1c MFr Bld: 6.6 % — ABNORMAL HIGH (ref 4.8–5.6)

## 2021-03-20 LAB — TSH: TSH: 3.02 u[IU]/mL (ref 0.450–4.500)

## 2021-03-20 LAB — VITAMIN D 25 HYDROXY (VIT D DEFICIENCY, FRACTURES): Vit D, 25-Hydroxy: 49 ng/mL (ref 30.0–100.0)

## 2021-03-25 ENCOUNTER — Encounter: Payer: Self-pay | Admitting: Physician Assistant

## 2021-03-25 DIAGNOSIS — B373 Candidiasis of vulva and vagina: Secondary | ICD-10-CM

## 2021-03-25 DIAGNOSIS — B3731 Acute candidiasis of vulva and vagina: Secondary | ICD-10-CM

## 2021-03-25 MED ORDER — FLUCONAZOLE 150 MG PO TABS: 150.0000 mg | ORAL_TABLET | Freq: Once | ORAL | 0 refills | Status: AC

## 2021-03-25 NOTE — Telephone Encounter (Signed)
Per Herb Grays sending Diflucan to pharmacy for treatment of yeast. AS, CMA

## 2021-03-26 MED ORDER — ATORVASTATIN CALCIUM 20 MG PO TABS: 1.0000 | ORAL_TABLET | Freq: Every day | ORAL | 1 refills | Status: DC

## 2021-03-26 MED ORDER — LEVOTHYROXINE SODIUM 100 MCG PO TABS: 100.0000 ug | ORAL_TABLET | Freq: Every day | ORAL | 1 refills | Status: DC

## 2021-04-12 ENCOUNTER — Encounter: Payer: Self-pay | Admitting: Physician Assistant

## 2021-04-12 DIAGNOSIS — L259 Unspecified contact dermatitis, unspecified cause: Secondary | ICD-10-CM

## 2021-04-12 MED ORDER — TRIAMCINOLONE ACETONIDE 0.1 % EX CREA: 1.0000 "application " | TOPICAL_CREAM | Freq: Two times a day (BID) | CUTANEOUS | 0 refills | Status: DC

## 2021-05-05 ENCOUNTER — Other Ambulatory Visit: Payer: Self-pay | Admitting: Physician Assistant

## 2021-05-05 DIAGNOSIS — E785 Hyperlipidemia, unspecified: Secondary | ICD-10-CM

## 2021-05-05 NOTE — Telephone Encounter (Signed)
Please contact patient to schedule a follow up appointment per last AVS

## 2021-07-21 ENCOUNTER — Ambulatory Visit (INDEPENDENT_AMBULATORY_CARE_PROVIDER_SITE_OTHER): Payer: Medicare HMO | Admitting: Physician Assistant

## 2021-07-21 ENCOUNTER — Other Ambulatory Visit: Payer: Self-pay

## 2021-07-21 ENCOUNTER — Encounter: Payer: Self-pay | Admitting: Physician Assistant

## 2021-07-21 VITALS — BP 117/72 | HR 62 | Temp 97.6°F | Ht 63.5 in | Wt 221.4 lb

## 2021-07-21 DIAGNOSIS — R030 Elevated blood-pressure reading, without diagnosis of hypertension: Secondary | ICD-10-CM | POA: Diagnosis not present

## 2021-07-21 DIAGNOSIS — E785 Hyperlipidemia, unspecified: Secondary | ICD-10-CM | POA: Diagnosis not present

## 2021-07-21 DIAGNOSIS — I10 Essential (primary) hypertension: Secondary | ICD-10-CM

## 2021-07-21 DIAGNOSIS — E079 Disorder of thyroid, unspecified: Secondary | ICD-10-CM | POA: Diagnosis not present

## 2021-07-21 DIAGNOSIS — R7303 Prediabetes: Secondary | ICD-10-CM

## 2021-07-21 MED ORDER — HYDROCHLOROTHIAZIDE 25 MG PO TABS: 25.0000 mg | ORAL_TABLET | Freq: Every day | ORAL | 1 refills | Status: DC

## 2021-07-21 MED ORDER — LEVOTHYROXINE SODIUM 100 MCG PO TABS: 100.0000 ug | ORAL_TABLET | Freq: Every day | ORAL | 1 refills | Status: DC

## 2021-07-21 MED ORDER — ATORVASTATIN CALCIUM 20 MG PO TABS: 20.0000 mg | ORAL_TABLET | Freq: Every day | ORAL | 1 refills | Status: DC

## 2021-07-21 NOTE — Patient Instructions (Signed)
Preventing Type 2 Diabetes Mellitus °Type 2 diabetes, also called type 2 diabetes mellitus, is a long-term (chronic) disease that affects sugar (glucose) levels in your blood. Normally, a hormone called insulin allows glucose to enter cells in your body. The cells use glucose for energy. With type 2 diabetes, you will have one or both of these problems: °Your pancreas does not make enough insulin. °Cells in your body do not respond properly to insulin that your body makes (insulin resistance). °Insulin resistance or lack of insulin causes extra glucose to build up in the blood instead of going into cells. As a result, high blood glucose (hyperglycemia) develops. That can cause many complications. Being overweight or obese and having an inactive (sedentary) lifestyle can increase your risk for diabetes. Type 2 diabetes can be delayed or prevented by making certain nutrition and lifestyle changes. °How can this condition affect me? °If you do not take steps to prevent diabetes, your blood glucose levels may keep increasing over time. Too much glucose in your blood for a long time can damage your blood vessels, heart, kidneys, nerves, and eyes. °Type 2 diabetes can lead to chronic health problems and complications, such as: °Heart disease. °Stroke. °Blindness. °Kidney disease. °Depression. °Poor circulation in your feet and legs. In severe cases, a foot or leg may need to be surgically removed (amputated). °What can increase my risk? °You may be more likely to develop type 2 diabetes if you: °Have type 2 diabetes in your family. °Are overweight or obese. °Have a sedentary lifestyle. °Have insulin resistance or a history of prediabetes. °Have a history of pregnancy-related (gestational) diabetes or polycystic ovary syndrome (PCOS). °What actions can I take to prevent this? °It can be difficult to recognize signs of type 2 diabetes. Taking action to prevent the disease before you develop symptoms is the best way to avoid  possible damage to your body. Making certain nutrition and lifestyle changes may prevent or delay the disease and related health problems. °Nutrition ° °Eat healthy meals and snacks regularly. Do not skip meals. Fruit or a handful of nuts is a healthy snack between meals. °Drink water throughout the day. Avoid drinks that contain added sugar, such as soda or sweetened tea. Drink enough fluid to keep your urine pale yellow. °Follow instructions from your health care provider about eating or drinking restrictions. °Limit the amount of food you eat by: °Managing how much you eat at a time (portion size). °Checking food labels for the serving sizes of food. °Using a kitchen scale to weigh amounts of food. °Sauté or steam food instead of frying it. Cook with water or broth instead of oils or butter. °Limit saturated fat and salt (sodium) in your diet. Have no more than 1 tsp (2,400 mg) of sodium a day. If you have heart disease or high blood pressure, use less than ½?¾ tsp (1,500 mg) of sodium a day. °Lifestyle ° °Lose weight if needed and as told. Your health care provider can determine how much weight loss is best for you and can help you lose weight safely. °If you are overweight or obese, you may be told to lose at least 5?7% of your body weight. °Manage blood pressure, cholesterol, and stress. Your health care provider will help determine the best treatment for you. °Do not use any products that contain nicotine or tobacco. These products include cigarettes, chewing tobacco, and vaping devices, such as e-cigarettes. If you need help quitting, ask your health care provider. °Activity ° °Do physical   activity that makes your heart beat faster and makes you sweat (moderate intensity). Do this for at least 30 minutes on at least 5 days of the week, or as much as told by your health care provider. °Ask your health care provider what activities are safe for you. A mix of activities may be best, such as walking, swimming,  cycling, and strength training. °Try to add physical activity into your day. For example: °Park your car farther away than usual so that you walk more. °Take a walk during your lunch break. °Use stairs instead of elevators or escalators. °Walk or bike to work instead of driving. °Alcohol use °If you drink alcohol: °Limit how much you have to: °0?1 drink a day for women who are not pregnant. °0?2 drinks a day for men. °Know how much alcohol is in your drink. In the [place] one drink equals one 12 oz bottle of beer (355 mL), one 5 oz glass of wine (148 mL), or one 1½ oz glass of hard liquor (44 mL). °General information °Talk with your health care provider about your risk factors and how you can reduce your risk for diabetes. °Have your blood glucose tested regularly, as told by your health care provider. °Get screening tests as told by your health care provider. You may have these regularly, especially if you have certain risk factors for type 2 diabetes. °Make an appointment with a registered dietitian. This diet and nutrition specialist can help you make a healthy eating plan and help you understand portion sizes and food labels. °Where to find support °Ask your health care provider to recommend a registered dietitian, a certified diabetes care and education specialist, or a weight loss program. °Look for local or online weight loss groups. °Join a gym, fitness club, or outdoor activity group, such as a walking club. °Where to find more information °For help and guidance and to learn more about diabetes and diabetes prevention, visit: °American Diabetes Association (ADA): www.diabetes.org °National Institute of Diabetes and Digestive and Kidney Diseases: www.niddk.nih.gov °To learn more about healthy eating, visit: °[place] Department of Agriculture (USDA): www.choosemyplate.gov °Office of Disease Prevention and Health Promotion (ODPHP): health.gov °Summary °You can delay or prevent type 2 diabetes by eating healthy  foods, losing weight if needed, and increasing your physical activity. °Talk with your health care provider about your risk factors for type 2 diabetes and how you can reduce your risk. °It can be difficult to recognize the signs of type 2 diabetes. The best way to avoid possible damage to your body is to take action to prevent the disease before you develop symptoms. °Get screening tests as told by your health care provider. °This information is not intended to replace advice given to you by your health care provider. Make sure you discuss any questions you have with your health care provider. °Document Revised: 01/18/2021 Document Reviewed: 01/18/2021 °Elsevier Patient Education © 2022 [employer] Inc. ° °

## 2021-07-21 NOTE — Progress Notes (Signed)
Established Patient Office Visit  Subjective:  Patient ID: Dawn Alexander, female    DOB: [birth year]  Age: 65 y.o. MRN: [id number]  CC:  Chief Complaint  Patient presents with   Follow-up   Hypertension   Hyperlipidemia    HPI Dawn Alexander presents for follow up on hyperlipidemia, hypertension and hypothyroidism. Has no acute concerns.  HTN: Pt denies chest pain, palpitations, dizziness, shortness of breath, or worsening lower extremity swelling. Taking medication as directed without side effects. Has not checked BP at home. Pt follows a low salt diet.  HLD: Pt taking medication as directed without issues. Denies side effects including myalgias and RUQ pain.   Hypothyroidism: Asymptomatic. Taking medication as directed without issues.  Prediabetes: Denies increased thirst or urination. Denies making diet changes.   Past Medical History:  Diagnosis Date   Allergy    Chronic lower back pain    Fibroid    Insomnia    Restless leg syndrome    Thyroid disease    Vitamin D deficiency     Past Surgical History:  Procedure Laterality Date   ELBOW SURGERY Right 1999    Family History  Problem Relation Age of Onset   Diabetes Mother    COPD Mother    Cancer Mother        lung   Multiple sclerosis Father    Heart disease Father    Cancer Sister        lung   Stroke Sister    Cancer Brother        bladder   Stroke Sister    Leukemia Sister    Alcohol abuse Brother    Healthy Brother    Healthy Daughter    Leukemia Brother 5    Social History   Socioeconomic History   Marital status: Married    Spouse name: Not on file   Number of children: Not on file   Years of education: Not on file   Highest education level: Not on file  Occupational History   Not on file  Tobacco Use   Smoking status: Never   Smokeless tobacco: Never  Vaping Use   Vaping Use: Never used  Substance and Sexual Activity   Alcohol use: Yes    Alcohol/week: 5.0 standard drinks     Types: 5 Shots of liquor per week   Drug use: No   Sexual activity: Yes    Partners: Male    Birth control/protection: Post-menopausal  Other Topics Concern   Not on file  Social History Narrative   Not on file   Social Determinants of Health   Financial Resource Strain: Not on file  Food Insecurity: Not on file  Transportation Needs: Not on file  Physical Activity: Not on file  Stress: Not on file  Social Connections: Not on file  Intimate Partner Violence: Not on file    Outpatient Medications Prior to Visit  Medication Sig Dispense Refill   Acetaminophen-Caffeine (EXCEDRIN ASPIRIN FREE PO) Take by mouth daily as needed.     Ascorbic Acid (VITAMIN C) 1000 MG tablet Take 2,000 mg by mouth daily.     B Complex Vitamins (B COMPLEX-B12 PO) Take by mouth.     Cholecalciferol (VITAMIN D3) 125 MCG (5000 UT) CAPS Take 1 capsule by mouth daily.     MAGNESIUM PO Take 1 tablet by mouth in the morning and at bedtime.      triamcinolone cream (KENALOG) 0.1 % Apply 1 application topically 2 (two) times  daily. 30 g 0   vitamin B-12 (CYANOCOBALAMIN) 500 MCG tablet Take 500 mcg by mouth daily.     atorvastatin (LIPITOR) 20 MG tablet TAKE 1 TABLET AT BEDTIME 90 tablet 0   hydrochlorothiazide (HYDRODIURIL) 25 MG tablet TAKE 1 TABLET EVERY DAY 90 tablet 0   levothyroxine (SYNTHROID) 100 MCG tablet Take 1 tablet (100 mcg total) by mouth daily before breakfast. 90 tablet 1   amoxicillin-clavulanate (AUGMENTIN) 875-125 MG tablet Take 1 tablet by mouth 2 (two) times daily. 14 tablet 0   No facility-administered medications prior to visit.    Allergies  Allergen Reactions   Clindamycin/Lincomycin Rash    ROS Review of Systems A fourteen system review of systems was performed and found to be positive as per HPI.  Objective:    Physical Exam General:  Well Developed, well nourished, appropriate for stated age.  Neuro:  Alert and oriented,  extra-ocular muscles intact  HEENT:   Normocephalic, atraumatic, neck supple Skin:  no gross rash, warm, pink. Cardiac:  RRR, S1 S2 Respiratory:  CTA B/L, Not using accessory muscles, speaking in full sentences- unlabored. Vascular:  Ext warm, no cyanosis apprec.; cap RF less 2 sec. 1+ pitting edema RLE Psych:  No HI/SI, judgement and insight good, Euthymic mood. Full Affect.  BP 117/72   Pulse 62   Temp 97.6 F (36.4 C)   Ht 5' 3.5" (1.613 m)   Wt 221 lb 6.4 oz (100.4 kg)   SpO2 98%   BMI 38.60 kg/m  Wt Readings from Last 3 Encounters:  07/21/21 221 lb 6.4 oz (100.4 kg)  03/19/21 224 lb (101.6 kg)  10/08/20 225 lb 11.2 oz (102.4 kg)     Health Maintenance Due  Topic Date Due   COVID-19 Vaccine (1) Never done   Hepatitis C Screening  Never done   COLONOSCOPY (Pts 45-20yr Insurance coverage will need to be confirmed)  11/07/2018   DEXA SCAN  Never done   Zoster Vaccines- Shingrix (2 of 2) 10/23/2019   INFLUENZA VACCINE  06/07/2021    There are no preventive care reminders to display for this patient.  Lab Results  Component Value Date   TSH 3.020 03/19/2021   Lab Results  Component Value Date   WBC 5.0 03/19/2021   HGB 14.3 03/19/2021   HCT 43.9 03/19/2021   MCV 87 03/19/2021   PLT 201 03/19/2021   Lab Results  Component Value Date   NA 140 03/19/2021   K 3.3 (L) 03/19/2021   CO2 28 03/19/2021   GLUCOSE 107 (H) 03/19/2021   BUN 15 03/19/2021   CREATININE 0.83 03/19/2021   BILITOT 0.5 03/19/2021   ALKPHOS 80 03/19/2021   AST 20 03/19/2021   ALT 19 03/19/2021   PROT 6.8 03/19/2021   ALBUMIN 4.5 03/19/2021   CALCIUM 9.3 03/19/2021   EGFR 78 03/19/2021   Lab Results  Component Value Date   CHOL 136 03/19/2021   Lab Results  Component Value Date   HDL 61 03/19/2021   Lab Results  Component Value Date   LDLCALC 55 03/19/2021   Lab Results  Component Value Date   TRIG 115 03/19/2021   Lab Results  Component Value Date   CHOLHDL 2.2 03/19/2021   Lab Results  Component Value  Date   HGBA1C 6.6 (H) 03/19/2021      Assessment & Plan:   Problem List Items Addressed This Visit       Endocrine   hypothyroidism (Chronic)    -Last  TSH wnl -Continue current medication regimen. -Rechecking TSH today. Pending results will make medication adjustments if indicated.        Relevant Medications   levothyroxine (SYNTHROID) 100 MCG tablet   Other Relevant Orders   TSH     Other   Morbid obesity ([place]) (Chronic)    -Associated with hypertension, hyperlipidemia. -Patient has lost 3 pounds since last visit. Recommend dietary changes and continue to stay active.      Prediabetes    -Will repeat A1c today. -Discussed low carbohydrate and glucose diet.      Relevant Orders   HgB A1c   Hyperlipidemia    -Last lipid panel: total cholesterol 136, triglycerides 115, HDL 61, LDL 55 -Continue current medication regimen. Follow low fat diet. -Will repeat lipid and hepatic function today.       Relevant Medications   atorvastatin (LIPITOR) 20 MG tablet   hydrochlorothiazide (HYDRODIURIL) 25 MG tablet   Other Relevant Orders   CBC w/Diff   Comp Met (CMET)   Lipid Profile   Elevated blood pressure reading   Relevant Medications   hydrochlorothiazide (HYDRODIURIL) 25 MG tablet   Other Visit Diagnoses     Hypertension, unspecified type    -  Primary   Relevant Medications   atorvastatin (LIPITOR) 20 MG tablet   hydrochlorothiazide (HYDRODIURIL) 25 MG tablet   Other Relevant Orders   CBC w/Diff   Comp Met (CMET)      Hypertension: -Controlled. -Continue current medication regimen. -Will collect CMP for medication monitoring.  Meds ordered this encounter  Medications   atorvastatin (LIPITOR) 20 MG tablet    Sig: Take 1 tablet (20 mg total) by mouth at bedtime.    Dispense:  90 tablet    Refill:  1    Order Specific Question:   Supervising Provider    Answer:   Beatrice Lecher D [[id number]]   hydrochlorothiazide (HYDRODIURIL) 25 MG tablet    Sig:  Take 1 tablet (25 mg total) by mouth daily.    Dispense:  90 tablet    Refill:  1    Order Specific Question:   Supervising Provider    Answer:   Hali Marry [[id number]]   levothyroxine (SYNTHROID) 100 MCG tablet    Sig: Take 1 tablet (100 mcg total) by mouth daily before breakfast.    Dispense:  90 tablet    Refill:  1    Order Specific Question:   Supervising Provider    Answer:   Beatrice Lecher D [[id number]]    Follow-up: Return in about 3 months (around 10/20/2021) for [facility].    Lorrene Reid, PA-C

## 2021-07-21 NOTE — Assessment & Plan Note (Signed)
-  Last TSH wnl -Continue current medication regimen. -Rechecking TSH today. Pending results will make medication adjustments if indicated.  

## 2021-07-21 NOTE — Assessment & Plan Note (Signed)
-  Associated with hypertension, hyperlipidemia. -Patient has lost 3 pounds since last visit. Recommend dietary changes and continue to stay active.

## 2021-07-21 NOTE — Assessment & Plan Note (Signed)
-  Last lipid panel: total cholesterol 136, triglycerides 115, HDL 61, LDL 55 -Continue current medication regimen. Follow low fat diet. -Will repeat lipid and hepatic function today.

## 2021-07-21 NOTE — Assessment & Plan Note (Signed)
-  Will repeat A1c today. -Discussed low carbohydrate and glucose diet.

## 2021-07-22 ENCOUNTER — Encounter: Payer: Self-pay | Admitting: Physician Assistant

## 2021-07-22 LAB — CBC WITH DIFFERENTIAL/PLATELET
Basophils Absolute: 0.1 10*3/uL (ref 0.0–0.2)
Basos: 1 %
EOS (ABSOLUTE): 0.3 10*3/uL (ref 0.0–0.4)
Eos: 5 %
Hematocrit: 43.6 % (ref 34.0–46.6)
Hemoglobin: 14.3 g/dL (ref 11.1–15.9)
Immature Grans (Abs): 0 10*3/uL (ref 0.0–0.1)
Immature Granulocytes: 0 %
Lymphocytes Absolute: 1.6 10*3/uL (ref 0.7–3.1)
Lymphs: 31 %
MCH: 28.3 pg (ref 26.6–33.0)
MCHC: 32.8 g/dL (ref 31.5–35.7)
MCV: 86 fL (ref 79–97)
Monocytes Absolute: 0.5 10*3/uL (ref 0.1–0.9)
Monocytes: 10 %
Neutrophils Absolute: 2.7 10*3/uL (ref 1.4–7.0)
Neutrophils: 53 %
Platelets: 214 10*3/uL (ref 150–450)
RBC: 5.05 x10E6/uL (ref 3.77–5.28)
RDW: 12.3 % (ref 11.7–15.4)
WBC: 5.2 10*3/uL (ref 3.4–10.8)

## 2021-07-22 LAB — LIPID PANEL
Chol/HDL Ratio: 2.5 ratio (ref 0.0–4.4)
Cholesterol, Total: 145 mg/dL (ref 100–199)
HDL: 59 mg/dL (ref >39)
LDL Chol Calc (NIH): 63 mg/dL (ref 0–99)
Triglycerides: 130 mg/dL (ref 0–149)
VLDL Cholesterol Cal: 23 mg/dL (ref 5–40)

## 2021-07-22 LAB — COMPREHENSIVE METABOLIC PANEL
ALT: 20 IU/L (ref 0–32)
AST: 19 IU/L (ref 0–40)
Albumin/Globulin Ratio: 1.7 (ref 1.2–2.2)
Albumin: 4.5 g/dL (ref 3.8–4.8)
Alkaline Phosphatase: 86 IU/L (ref 44–121)
BUN/Creatinine Ratio: 20 (ref 12–28)
BUN: 18 mg/dL (ref 8–27)
Bilirubin Total: 0.6 mg/dL (ref 0.0–1.2)
CO2: 29 mmol/L (ref 20–29)
Calcium: 9.5 mg/dL (ref 8.7–10.3)
Chloride: 100 mmol/L (ref 96–106)
Creatinine, Ser: 0.9 mg/dL (ref 0.57–1.00)
Globulin, Total: 2.7 g/dL (ref 1.5–4.5)
Glucose: 104 mg/dL — ABNORMAL HIGH (ref 65–99)
Potassium: 4 mmol/L (ref 3.5–5.2)
Sodium: 141 mmol/L (ref 134–144)
Total Protein: 7.2 g/dL (ref 6.0–8.5)
eGFR: 71 mL/min/{1.73_m2} (ref >59)

## 2021-07-22 LAB — TSH: TSH: 11.7 u[IU]/mL — ABNORMAL HIGH (ref 0.450–4.500)

## 2021-07-22 LAB — HEMOGLOBIN A1C
Est. average glucose Bld gHb Est-mCnc: 137 mg/dL
Hgb A1c MFr Bld: 6.4 % — ABNORMAL HIGH (ref 4.8–5.6)

## 2021-07-23 ENCOUNTER — Encounter: Payer: Self-pay | Admitting: Physician Assistant

## 2021-07-23 LAB — T4, FREE: Free T4: 0.93 ng/dL (ref 0.82–1.77)

## 2021-07-23 LAB — SPECIMEN STATUS REPORT

## 2021-07-23 LAB — T3: T3, Total: 86 ng/dL (ref 71–180)

## 2021-08-05 ENCOUNTER — Other Ambulatory Visit: Payer: Self-pay | Admitting: Physician Assistant

## 2021-08-05 DIAGNOSIS — R030 Elevated blood-pressure reading, without diagnosis of hypertension: Secondary | ICD-10-CM

## 2021-09-03 DIAGNOSIS — Z1231 Encounter for screening mammogram for malignant neoplasm of breast: Secondary | ICD-10-CM | POA: Diagnosis not present

## 2021-09-03 LAB — HM MAMMOGRAPHY

## 2021-09-10 ENCOUNTER — Encounter: Payer: Self-pay | Admitting: Physician Assistant

## 2021-09-18 ENCOUNTER — Other Ambulatory Visit: Payer: Self-pay | Admitting: Physician Assistant

## 2021-09-18 DIAGNOSIS — E079 Disorder of thyroid, unspecified: Secondary | ICD-10-CM

## 2021-10-26 ENCOUNTER — Ambulatory Visit: Payer: Medicare HMO | Admitting: Physician Assistant

## 2021-11-25 ENCOUNTER — Other Ambulatory Visit: Payer: Self-pay | Admitting: Physician Assistant

## 2021-11-25 DIAGNOSIS — E785 Hyperlipidemia, unspecified: Secondary | ICD-10-CM

## 2021-11-26 ENCOUNTER — Ambulatory Visit (INDEPENDENT_AMBULATORY_CARE_PROVIDER_SITE_OTHER): Payer: Medicare HMO | Admitting: Physician Assistant

## 2021-11-26 ENCOUNTER — Encounter: Payer: Self-pay | Admitting: Physician Assistant

## 2021-11-26 ENCOUNTER — Other Ambulatory Visit: Payer: Self-pay

## 2021-11-26 VITALS — BP 109/72 | HR 66 | Temp 97.8°F | Ht 64.0 in | Wt 233.0 lb

## 2021-11-26 DIAGNOSIS — M25561 Pain in right knee: Secondary | ICD-10-CM

## 2021-11-26 DIAGNOSIS — E079 Disorder of thyroid, unspecified: Secondary | ICD-10-CM | POA: Diagnosis not present

## 2021-11-26 DIAGNOSIS — G8929 Other chronic pain: Secondary | ICD-10-CM | POA: Diagnosis not present

## 2021-11-26 MED ORDER — PREDNISONE 20 MG PO TABS: ORAL_TABLET | ORAL | 0 refills | Status: DC

## 2021-11-26 MED ORDER — CELECOXIB 100 MG PO CAPS: 100.0000 mg | ORAL_CAPSULE | Freq: Two times a day (BID) | ORAL | 0 refills | Status: DC

## 2021-11-26 NOTE — Progress Notes (Signed)
Acute Office Visit  Subjective:    Patient ID: Dawn Alexander, female    DOB: 1956-09-22, 66 y.o.   MRN: [id number]  Chief Complaint  Patient presents with   Knee Pain    Right    Acute Visit    HPI Patient is in today for c/o right knee pain. Patient had a fall last Spring and landed on her right knee. States since then has been having right knee pain that comes and goes. Reports pain is progressively worse during the day. Has throbbing pain with walking and driving. Hurts to stretch it out. States has some swelling. Has been applying deep blue topical at night which does provide some relief.  Denies redness or stiffness. Resting makes the pain better. Taking 800 mg of Ibuprofen when needed. Also states sometimes feel like her knee might give out.  Past Medical History:  Diagnosis Date   Allergy    Chronic lower back pain    Fibroid    Insomnia    Restless leg syndrome    Thyroid disease    Vitamin D deficiency     Past Surgical History:  Procedure Laterality Date   ELBOW SURGERY Right 1999    Family History  Problem Relation Age of Onset   Diabetes Mother    COPD Mother    Cancer Mother        lung   Multiple sclerosis Father    Heart disease Father    Cancer Sister        lung   Stroke Sister    Cancer Brother        bladder   Stroke Sister    Leukemia Sister    Alcohol abuse Brother    Healthy Brother    Healthy Daughter    Leukemia Brother 5    Social History   Socioeconomic History   Marital status: Married    Spouse name: Not on file   Number of children: Not on file   Years of education: Not on file   Highest education level: Not on file  Occupational History   Not on file  Tobacco Use   Smoking status: Never   Smokeless tobacco: Never  Vaping Use   Vaping Use: Never used  Substance and Sexual Activity   Alcohol use: Yes    Alcohol/week: 5.0 standard drinks    Types: 5 Shots of liquor per week   Drug use: No   Sexual activity: Yes     Partners: Male    Birth control/protection: Post-menopausal  Other Topics Concern   Not on file  Social History Narrative   Not on file   Social Determinants of Health   Financial Resource Strain: Not on file  Food Insecurity: Not on file  Transportation Needs: Not on file  Physical Activity: Not on file  Stress: Not on file  Social Connections: Not on file  Intimate Partner Violence: Not on file    Outpatient Medications Prior to Visit  Medication Sig Dispense Refill   diphenhydramine-acetaminophen (TYLENOL PM) 25-500 MG TABS tablet Take 2 tablets by mouth at bedtime as needed.     ibuprofen (ADVIL) 200 MG tablet Take 400 mg by mouth every 6 (six) hours as needed.     Acetaminophen-Caffeine (EXCEDRIN ASPIRIN FREE PO) Take by mouth daily as needed.     Ascorbic Acid (VITAMIN C) 1000 MG tablet Take 2,000 mg by mouth daily.     atorvastatin (LIPITOR) 20 MG tablet Take 1 tablet (20  mg total) by mouth at bedtime. 90 tablet 1   B Complex Vitamins (B COMPLEX-B12 PO) Take by mouth.     Cholecalciferol (VITAMIN D3) 125 MCG (5000 UT) CAPS Take 1 capsule by mouth daily.     hydrochlorothiazide (HYDRODIURIL) 25 MG tablet Take 1 tablet (25 mg total) by mouth daily. 90 tablet 1   levothyroxine (SYNTHROID) 100 MCG tablet TAKE 1 TABLET EVERY DAY BEFORE BREAKFAST 90 tablet 1   MAGNESIUM PO Take 1 tablet by mouth in the morning and at bedtime.      vitamin B-12 (CYANOCOBALAMIN) 500 MCG tablet Take 500 mcg by mouth daily.     triamcinolone cream (KENALOG) 0.1 % Apply 1 application topically 2 (two) times daily. 30 g 0   No facility-administered medications prior to visit.    Allergies  Allergen Reactions   Clindamycin/Lincomycin Rash    Review of Systems Review of Systems:  A fourteen system review of systems was performed and found to be positive as per HPI.     Objective:    Physical Exam General:  Well Developed, well nourished, appropriate for stated age.  Neuro:  Alert and  oriented,  extra-ocular muscles intact  HEENT:  Normocephalic, atraumatic, neck supple  Skin:  no gross rash, warm, pink. Cardiac:  RRR, S1 S2 Respiratory: CTA B/L  MSK: tenderness of lateral lower leg, tibialis anterior muscle, patellar ligament and medial knee (right). No deformity noted. Neg varus and valgus stress test, neg anterior and posterior drawer test. Overall good ROM with limited right knee extension. No effusion appreciated. Vascular:  Ext warm, no cyanosis apprec.; cap RF less 2 sec. Psych:  No HI/SI, judgement and insight good, Euthymic mood. Full Affect.  BP 109/72    Pulse 66    Temp 97.8 F (36.6 C)    Ht 5' 4"  (1.626 m)    Wt 233 lb (105.7 kg)    SpO2 98%    BMI 39.99 kg/m  Wt Readings from Last 3 Encounters:  11/26/21 233 lb (105.7 kg)  07/21/21 221 lb 6.4 oz (100.4 kg)  03/19/21 224 lb (101.6 kg)    Health Maintenance Due  Topic Date Due   COVID-19 Vaccine (1) Never done   Hepatitis C Screening  Never done   COLONOSCOPY (Pts 45-45yr Insurance coverage will need to be confirmed)  11/07/2018   DEXA SCAN  Never done   Zoster Vaccines- Shingrix (2 of 2) 10/23/2019    There are no preventive care reminders to display for this patient.   Lab Results  Component Value Date   TSH 11.700 (H) 07/21/2021   Lab Results  Component Value Date   WBC 5.2 07/21/2021   HGB 14.3 07/21/2021   HCT 43.6 07/21/2021   MCV 86 07/21/2021   PLT 214 07/21/2021   Lab Results  Component Value Date   NA 141 07/21/2021   K 4.0 07/21/2021   CO2 29 07/21/2021   GLUCOSE 104 (H) 07/21/2021   BUN 18 07/21/2021   CREATININE 0.90 07/21/2021   BILITOT 0.6 07/21/2021   ALKPHOS 86 07/21/2021   AST 19 07/21/2021   ALT 20 07/21/2021   PROT 7.2 07/21/2021   ALBUMIN 4.5 07/21/2021   CALCIUM 9.5 07/21/2021   EGFR 71 07/21/2021   Lab Results  Component Value Date   CHOL 145 07/21/2021   Lab Results  Component Value Date   HDL 59 07/21/2021   Lab Results  Component Value Date    LDLCALC 63 07/21/2021  Lab Results  Component Value Date   TRIG 130 07/21/2021   Lab Results  Component Value Date   CHOLHDL 2.5 07/21/2021   Lab Results  Component Value Date   HGBA1C 6.4 (H) 07/21/2021       Assessment & Plan:   Problem List Items Addressed This Visit       Endocrine   hypothyroidism - Primary (Chronic)   Relevant Orders   T4, free   T3   TSH   Other Visit Diagnoses     Chronic pain of right knee       Relevant Medications   predniSONE (DELTASONE) 20 MG tablet   celecoxib (CELEBREX) 100 MG capsule      Chronic pain of right knee: -Discussed with patient likely MSK related. Some s/sx are suggestive of OA and patellar tendonitis. Will start corticosteroid taper to help reduce inflammation and recommend to take Celebrex twice daily as needed for pain relief. Recommend to continue with conservative therapy. If symptoms fail to improve or worsen then recommend referral to Orthopedics or obtaining imaging with knee x-ray. Patient verbalized understanding.   Of noted, patient due for repeat thyroid labs, did not have labs repeated as advised 07/2021. Will collect today.   Meds ordered this encounter  Medications   predniSONE (DELTASONE) 20 MG tablet    Sig: Take 3 tablets PO x 2 days, then 2 tablets x 2 days, then 1 tablet x 2 days, then 0.5 tablet x 2 days    Dispense:  15 tablet    Refill:  0    Order Specific Question:   Supervising Provider    Answer:   Beatrice Lecher D [[id number]]   celecoxib (CELEBREX) 100 MG capsule    Sig: Take 1 capsule (100 mg total) by mouth 2 (two) times daily.    Dispense:  60 capsule    Refill:  0    Order Specific Question:   Supervising Provider    Answer:   Beatrice Lecher D [[id number]]     Lorrene Reid, PA-C

## 2021-11-26 NOTE — Patient Instructions (Addendum)
Osteoarthritis Osteoarthritis is a type of arthritis. It refers to joint pain or joint disease. Osteoarthritis affects tissue that covers the ends of bones in joints (cartilage). Cartilage acts as a cushion between the bones and helps them move smoothly. Osteoarthritis occurs when cartilage in the joints gets worn down. Osteoarthritis is sometimes called "wear and tear" arthritis. Osteoarthritis is the most common form of arthritis. It often occurs in older people. It is a condition that gets worse over time. The joints most often affected by this condition are in the fingers, toes, hips, knees, and spine, including the neck and lower back. What are the causes? This condition is caused by the wearing down of cartilage that covers the ends of bones. What increases the risk? The following factors may make you more likely to develop this condition: Being age 3 or older. Obesity. Overuse of joints. Past injury of a joint. Past surgery on a joint. Family history of osteoarthritis. What are the signs or symptoms? The main symptoms of this condition are pain, swelling, and stiffness in the joint. Other symptoms may include: An enlarged joint. More pain and further damage caused by small pieces of bone or cartilage that break off and float inside of the joint. Small deposits of bone (osteophytes) that grow on the edges of the joint. A grating or scraping feeling inside the joint when you move it. Popping or creaking sounds when you move. Difficulty walking or exercising. An inability to grip items, twist your hand(s), or control the movements of your hands and fingers. How is this diagnosed? This condition may be diagnosed based on: Your medical history. A physical exam. Your symptoms. X-rays of the affected joint(s). Blood tests to rule out other types of arthritis. How is this treated? There is no cure for this condition, but treatment can help control pain and improve joint function.  Treatment may include a combination of therapies, such as: Pain relief techniques, such as: Applying heat and cold to the joint. Massage. A form of talk therapy called cognitive behavioral therapy (CBT). This therapy helps you set goals and follow up on the changes that you make. Medicines for pain and inflammation. The medicines can be taken by mouth or applied to the skin. They include: NSAIDs, such as ibuprofen. Prescription medicines. Strong anti-inflammatory medicines (corticosteroids). Certain nutritional supplements. A prescribed exercise program. You may work with a physical therapist. Assistive devices, such as a brace, wrap, splint, specialized glove, or cane. A weight control plan. Surgery, such as: An osteotomy. This is done to reposition the bones and relieve pain or to remove loose pieces of bone and cartilage. Joint replacement surgery. You may need this surgery if you have advanced osteoarthritis. Follow these instructions at home: Activity Rest your affected joints as told by your health care provider. Exercise as told by your health care provider. He or she may recommend specific types of exercise, such as: Strengthening exercises. These are done to strengthen the muscles that support joints affected by arthritis. Aerobic activities. These are exercises, such as brisk walking or water aerobics, that increase your heart rate. Range-of-motion activities. These help your joints move more easily. Balance and agility exercises. Managing pain, stiffness, and swelling   If directed, apply heat to the affected area as often as told by your health care provider. Use the heat source that your health care provider recommends, such as a moist heat pack or a heating pad. If you have a removable assistive device, remove it as told by  your health care provider. Place a towel between your skin and the heat source. If your health care provider tells you to keep the assistive device on  while you apply heat, place a towel between the assistive device and the heat source. Leave the heat on for 20-30 minutes. Remove the heat if your skin turns bright red. This is especially important if you are unable to feel pain, heat, or cold. You may have a greater risk of getting burned. If directed, put ice on the affected area. To do this: If you have a removable assistive device, remove it as told by your health care provider. Put ice in a plastic bag. Place a towel between your skin and the bag. If your health care provider tells you to keep the assistive device on during icing, place a towel between the assistive device and the bag. Leave the ice on for 20 minutes, 2-3 times a day. Move your fingers or toes often to reduce stiffness and swelling. Raise (elevate) the injured area above the level of your heart while you are sitting or lying down. General instructions Take over-the-counter and prescription medicines only as told by your health care provider. Maintain a healthy weight. Follow instructions from your health care provider for weight control. Do not use any products that contain nicotine or tobacco, such as cigarettes, e-cigarettes, and chewing tobacco. If you need help quitting, ask your health care provider. Use assistive devices as told by your health care provider. Keep all follow-up visits as told by your health care provider. This is important. Where to find more information [employer] of Arthritis and Musculoskeletal and Skin Diseases: www.niams.SouthExposed.es [employer] on Aging: http://kim-miller.com/ American College of Rheumatology: www.rheumatology.org Contact a health care provider if: You have redness, swelling, or a feeling of warmth in a joint that gets worse. You have a fever along with joint or muscle aches. You develop a rash. You have trouble doing your normal activities. Get help right away if: You have pain that gets worse and is not relieved by  pain medicine. Summary Osteoarthritis is a type of arthritis that affects tissue covering the ends of bones in joints (cartilage). This condition is caused by the wearing down of cartilage that covers the ends of bones. The main symptom of this condition is pain, swelling, and stiffness in the joint. There is no cure for this condition, but treatment can help control pain and improve joint function. This information is not intended to replace advice given to you by your health care provider. Make sure you discuss any questions you have with your health care provider. Document Revised: 10/21/2019 Document Reviewed: 10/21/2019 Elsevier Patient Education  [place].   Acute Knee Pain, Adult Many things can cause knee pain. Sometimes, knee pain is sudden (acute) and may be caused by damage, swelling, or irritation of the muscles and tissues that support your knee. The pain often goes away on its own with time and rest. If the pain does not go away, tests may be done to find out what is causing the pain. Follow these instructions at home: If you have a knee sleeve or brace:  Wear the knee sleeve or brace as told by your doctor. Take it off only as told by your doctor. Loosen it if your toes: Tingle. Become numb. Turn cold and blue. Keep it clean. If the knee sleeve or brace is not waterproof: Do not let it get wet. Cover it with a watertight covering when  you take a bath or shower. Activity Rest your knee. Do not do things that cause pain or make pain worse. Avoid activities where both feet leave the ground at the same time (high-impact activities). Examples are running, jumping rope, and doing jumping jacks. Work with a physical therapist to make a safe exercise program, as told by your doctor. Managing pain, stiffness, and swelling  If told, put ice on the knee. To do this: If you have a removable knee sleeve or brace, take it off as told by your doctor. Put ice in a plastic  bag. Place a towel between your skin and the bag. Leave the ice on for 20 minutes, 2-3 times a day. Take off the ice if your skin turns bright red. This is very important. If you cannot feel pain, heat, or cold, you have a greater risk of damage to the area. If told, use an elastic bandage to put pressure (compression) on your injured knee. Raise your knee above the level of your heart while you are sitting or lying down. Sleep with a pillow under your knee. General instructions Take over-the-counter and prescription medicines only as told by your doctor. Do not smoke or use any products that contain nicotine or tobacco. If you need help quitting, ask your doctor. If you are overweight, work with your doctor and a food expert (dietitian) to set goals to lose weight. Being overweight can make your knee hurt more. Watch for any changes in your symptoms. Keep all follow-up visits. Contact a doctor if: The knee pain does not stop. The knee pain changes or gets worse. You have a fever along with knee pain. Your knee is red or feels warm when you touch it. Your knee gives out or locks up. Get help right away if: Your knee swells, and the swelling gets worse. You cannot move your knee. You have very bad knee pain that does not get better with pain medicine. Summary Many things can cause knee pain. The pain often goes away on its own with time and rest. Your doctor may do tests to find out the cause of the pain. Watch for any changes in your symptoms. Relieve your pain with rest, medicines, light activity, and use of ice. Get help right away if you cannot move your knee or your knee pain is very bad. This information is not intended to replace advice given to you by your health care provider. Make sure you discuss any questions you have with your health care provider. Document Revised: 04/08/2020 Document Reviewed: 04/08/2020 Elsevier Patient Education  2022 [employer].

## 2021-11-27 LAB — T4, FREE: Free T4: 1.36 ng/dL (ref 0.82–1.77)

## 2021-11-27 LAB — T3: T3, Total: 92 ng/dL (ref 71–180)

## 2021-11-27 LAB — TSH: TSH: 5.92 u[IU]/mL — ABNORMAL HIGH (ref 0.450–4.500)

## 2021-11-29 ENCOUNTER — Encounter: Payer: Self-pay | Admitting: Physician Assistant

## 2021-12-26 ENCOUNTER — Encounter: Payer: Self-pay | Admitting: Physician Assistant

## 2021-12-26 DIAGNOSIS — R030 Elevated blood-pressure reading, without diagnosis of hypertension: Secondary | ICD-10-CM

## 2021-12-27 MED ORDER — HYDROCHLOROTHIAZIDE 25 MG PO TABS: 25.0000 mg | ORAL_TABLET | Freq: Every day | ORAL | 0 refills | Status: AC

## 2022-01-26 DIAGNOSIS — M25569 Pain in unspecified knee: Secondary | ICD-10-CM | POA: Diagnosis not present

## 2022-01-26 DIAGNOSIS — Z9889 Other specified postprocedural states: Secondary | ICD-10-CM | POA: Diagnosis not present

## 2022-01-26 DIAGNOSIS — E785 Hyperlipidemia, unspecified: Secondary | ICD-10-CM | POA: Diagnosis not present

## 2022-01-26 DIAGNOSIS — E559 Vitamin D deficiency, unspecified: Secondary | ICD-10-CM | POA: Diagnosis not present

## 2022-01-26 DIAGNOSIS — I10 Essential (primary) hypertension: Secondary | ICD-10-CM | POA: Diagnosis not present

## 2022-01-26 DIAGNOSIS — Z8669 Personal history of other diseases of the nervous system and sense organs: Secondary | ICD-10-CM | POA: Diagnosis not present

## 2022-01-26 DIAGNOSIS — R7303 Prediabetes: Secondary | ICD-10-CM | POA: Diagnosis not present

## 2022-01-26 DIAGNOSIS — E039 Hypothyroidism, unspecified: Secondary | ICD-10-CM | POA: Diagnosis not present

## 2022-02-02 ENCOUNTER — Other Ambulatory Visit: Payer: Self-pay | Admitting: Physician Assistant

## 2022-02-02 DIAGNOSIS — E785 Hyperlipidemia, unspecified: Secondary | ICD-10-CM

## 2022-06-29 ENCOUNTER — Other Ambulatory Visit: Payer: Self-pay | Admitting: Physician Assistant

## 2022-06-29 DIAGNOSIS — E079 Disorder of thyroid, unspecified: Secondary | ICD-10-CM

## 2022-07-21 DIAGNOSIS — I10 Essential (primary) hypertension: Secondary | ICD-10-CM | POA: Diagnosis not present

## 2022-07-21 DIAGNOSIS — E039 Hypothyroidism, unspecified: Secondary | ICD-10-CM | POA: Diagnosis not present

## 2022-07-21 DIAGNOSIS — E785 Hyperlipidemia, unspecified: Secondary | ICD-10-CM | POA: Diagnosis not present

## 2022-07-21 DIAGNOSIS — R7303 Prediabetes: Secondary | ICD-10-CM | POA: Diagnosis not present

## 2022-07-21 DIAGNOSIS — E559 Vitamin D deficiency, unspecified: Secondary | ICD-10-CM | POA: Diagnosis not present

## 2022-07-21 DIAGNOSIS — R7989 Other specified abnormal findings of blood chemistry: Secondary | ICD-10-CM | POA: Diagnosis not present

## 2022-07-28 DIAGNOSIS — Z8052 Family history of malignant neoplasm of bladder: Secondary | ICD-10-CM | POA: Diagnosis not present

## 2022-07-28 DIAGNOSIS — E039 Hypothyroidism, unspecified: Secondary | ICD-10-CM | POA: Diagnosis not present

## 2022-07-28 DIAGNOSIS — E876 Hypokalemia: Secondary | ICD-10-CM | POA: Diagnosis not present

## 2022-07-28 DIAGNOSIS — E785 Hyperlipidemia, unspecified: Secondary | ICD-10-CM | POA: Diagnosis not present

## 2022-07-28 DIAGNOSIS — E559 Vitamin D deficiency, unspecified: Secondary | ICD-10-CM | POA: Diagnosis not present

## 2022-07-28 DIAGNOSIS — M25569 Pain in unspecified knee: Secondary | ICD-10-CM | POA: Diagnosis not present

## 2022-07-28 DIAGNOSIS — I10 Essential (primary) hypertension: Secondary | ICD-10-CM | POA: Diagnosis not present

## 2022-07-28 DIAGNOSIS — Z0000 Encounter for general adult medical examination without abnormal findings: Secondary | ICD-10-CM | POA: Diagnosis not present

## 2022-07-28 DIAGNOSIS — R7303 Prediabetes: Secondary | ICD-10-CM | POA: Diagnosis not present

## 2022-08-03 DIAGNOSIS — H5203 Hypermetropia, bilateral: Secondary | ICD-10-CM | POA: Diagnosis not present

## 2022-08-17 DIAGNOSIS — Z23 Encounter for immunization: Secondary | ICD-10-CM | POA: Diagnosis not present

## 2022-09-28 DIAGNOSIS — Z1231 Encounter for screening mammogram for malignant neoplasm of breast: Secondary | ICD-10-CM | POA: Diagnosis not present

## 2022-12-14 ENCOUNTER — Ambulatory Visit: Payer: Medicare HMO | Admitting: Podiatry

## 2022-12-14 DIAGNOSIS — M79675 Pain in left toe(s): Secondary | ICD-10-CM

## 2022-12-14 DIAGNOSIS — B351 Tinea unguium: Secondary | ICD-10-CM | POA: Diagnosis not present

## 2022-12-14 DIAGNOSIS — M79674 Pain in right toe(s): Secondary | ICD-10-CM | POA: Diagnosis not present

## 2022-12-14 NOTE — Progress Notes (Signed)
   Chief Complaint  Patient presents with   Nail Problem    Routine foot care, nail trim, right foot plantar warts, rate of pain 3 out of 10 while walking,     SUBJECTIVE 67 year old female presents to office today complaining of elongated, thickened nails that cause pain while ambulating in shoes.  Patient is unable to trim their own nails.   Patient is also states that over the past several years she has developed symptomatic skin lesions to the plantar aspect of the feet.  Concern for possible plantar warts.  They have been present for several years and she normally tries to debride them herself.  Patient is here for further evaluation and treatment.  Past Medical History:  Diagnosis Date   Allergy    Chronic lower back pain    Fibroid    Insomnia    Restless leg syndrome    Thyroid disease    Vitamin D deficiency     Allergies  Allergen Reactions   Clindamycin/Lincomycin Rash     OBJECTIVE General Patient is awake, alert, and oriented x 3 and in no acute distress. Derm Skin is dry and supple bilateral. Negative open lesions or macerations. Remaining integument unremarkable. Nails are tender, long, thickened and dystrophic with subungual debris, consistent with onychomycosis, 1-5 bilateral. No signs of infection noted.  Multiple hyperkeratotic calluses noted with a central nucleated core bilateral feet consistent with porokeratosis Vasc  DP and PT pedal pulses palpable bilaterally. Temperature gradient within normal limits.  Neuro grossly intact via light touch Musculoskeletal Exam No symptomatic pedal deformities noted bilateral. Muscular strength within normal limits.  ASSESSMENT 1. Pain due to onychomycosis of toenails bilateral 2.  Porokeratoses bilateral feet; multiple  PLAN OF CARE 1. Patient evaluated today. 2. Instructed to maintain good pedal hygiene and foot care. Stressed importance of controlling blood sugar.  3. Mechanical debridement of nails 1-5  bilaterally performed using a nail nipper. Filed with dremel without incident.  Recommend OTC Tolcylen antifungal topical daily 4.  Excisional debridement of the multiple hyperkeratotic skin lesions was performed using a 312 scalpel without incident or bleeding.  Salicylic acid applied to the most symptomatic lesions with a Band-Aid. 5.  Recommend OTC  Revitaderm lotion daily 6.  Return to clinic in 3 mos.     Edrick Kins, DPM [facility]  Dr. Edrick Kins, DPM    [address], [place] [address]                Office [phone]  Fax [phone]

## 2023-01-03 ENCOUNTER — Ambulatory Visit (INDEPENDENT_AMBULATORY_CARE_PROVIDER_SITE_OTHER): Payer: Medicare HMO | Admitting: Obstetrics and Gynecology

## 2023-01-03 ENCOUNTER — Encounter: Payer: Self-pay | Admitting: Obstetrics and Gynecology

## 2023-01-03 VITALS — BP 122/74 | HR 69 | Ht 64.0 in | Wt 226.0 lb

## 2023-01-03 DIAGNOSIS — N9089 Other specified noninflammatory disorders of vulva and perineum: Secondary | ICD-10-CM | POA: Diagnosis not present

## 2023-01-03 DIAGNOSIS — R102 Pelvic and perineal pain: Secondary | ICD-10-CM | POA: Diagnosis not present

## 2023-01-03 DIAGNOSIS — R109 Unspecified abdominal pain: Secondary | ICD-10-CM

## 2023-01-03 NOTE — Addendum Note (Signed)
Addended by: Dorothy Spark on: 01/03/2023 02:47 PM   Modules accepted: Orders

## 2023-01-03 NOTE — Progress Notes (Signed)
67 y.o. G29P2002 Married White or Caucasian Not Hispanic or Latino female here for pelvic pain.    She reports a 1 month of intermittent pain in the LLQ. Felt like labor pains or a ovarian cyst pain. Initially the pain was more severe, now it is mild. Last felt the pain 2 days ago.  The pain has ranged from a 2-7/10 in severity.  Normal BM qd. No nausea, vomiting, diarrhea, constipation, no change in bladder function.  No vaginal bleeding. Not sexually active secondary to ED.   She wears a pad daily secondary to urinary leakage   01/25/17 CT showed an incidental finding of a 7.5 cm uterine fibroid.   No LMP recorded. Patient is postmenopausal.          Sexually active: No.  The current method of family planning is post menopausal status.    Exercising: No.  The patient does not participate in regular exercise at present. Smoker:  no  Health Maintenance: Pap:  12/25/18 WNL Hr hpv neg History of abnormal Pap:  no MMG:  2023 done at [facility] will request report  BMD:   none  Colonoscopy: none  TDaP:  03/12/2014 Gardasil: n/a   reports that she has never smoked. She has never used smokeless tobacco. She reports current alcohol use of about 5.0 standard drinks of alcohol per week. She reports that she does not use drugs.  Past Medical History:  Diagnosis Date   Allergy    Chronic lower back pain    Fibroid    Insomnia    Restless leg syndrome    Thyroid disease    Vitamin D deficiency     Past Surgical History:  Procedure Laterality Date   ELBOW SURGERY Right 1999    Current Outpatient Medications  Medication Sig Dispense Refill   Acetaminophen-Caffeine (EXCEDRIN ASPIRIN FREE PO) Take by mouth daily as needed.     Ascorbic Acid (VITAMIN C) 1000 MG tablet Take 2,000 mg by mouth daily.     B Complex Vitamins (B COMPLEX-B12 PO) Take by mouth.     celecoxib (CELEBREX) 100 MG capsule Take 1 capsule (100 mg total) by mouth 2 (two) times daily. 60 capsule 0   Cholecalciferol (VITAMIN  D3) 125 MCG (5000 UT) CAPS Take 1 capsule by mouth daily.     diphenhydramine-acetaminophen (TYLENOL PM) 25-500 MG TABS tablet Take 2 tablets by mouth at bedtime as needed.     hydrochlorothiazide (HYDRODIURIL) 25 MG tablet Take 1 tablet (25 mg total) by mouth daily. 90 tablet 0   ibuprofen (ADVIL) 200 MG tablet Take 400 mg by mouth every 6 (six) hours as needed.     levothyroxine (SYNTHROID) 100 MCG tablet TAKE 1 TABLET EVERY DAY BEFORE BREAKFAST 90 tablet 1   MAGNESIUM PO Take 1 tablet by mouth in the morning and at bedtime.      omeprazole (PRILOSEC) 20 MG capsule Take 20 mg by mouth daily.     atorvastatin (LIPITOR) 20 MG tablet Take 1 tablet (20 mg total) by mouth at bedtime. (Patient not taking: Reported on 01/03/2023) 90 tablet 1   vitamin B-12 (CYANOCOBALAMIN) 500 MCG tablet Take 500 mcg by mouth daily.     No current facility-administered medications for this visit.    Family History  Problem Relation Age of Onset   Diabetes Mother    COPD Mother    Cancer Mother        lung   Multiple sclerosis Father    Heart disease Father  Cancer Sister        lung   Stroke Sister    Cancer Brother        bladder   Stroke Sister    Leukemia Sister    Alcohol abuse Brother    Healthy Brother    Healthy Daughter    Leukemia Brother 5    Review of Systems  All other systems reviewed and are negative.   Exam:   BP 122/74   Pulse 69   Ht '5\' 4"'$  (1.626 m)   Wt 226 lb (102.5 kg)   SpO2 100%   BMI 38.79 kg/m   Weight change: '@WEIGHTCHANGE'$ @ Height:   Height: '5\' 4"'$  (162.6 cm)  Ht Readings from Last 3 Encounters:  01/03/23 '5\' 4"'$  (1.626 m)  11/26/21 '5\' 4"'$  (1.626 m)  07/21/21 5' 3.5" (1.613 m)    General appearance: alert, cooperative and appears stated age Abdomen: soft, mildly tender in the lower RLQ, no rebound, no guarding; non distended,  no masses,  no organomegaly  Pelvic: External genitalia:  several areas of whitening on the vulva, some agglutination, on the right labia  majora is a slightly raised, white lesion.               Urethra:  normal appearing urethra with no masses, tenderness or lesions              Bartholins and Skenes: normal                 Vagina: normal appearing vagina with normal color and discharge, no lesions              Cervix: no lesions               Bimanual Exam:  Uterus:   no masses or tenderness              Adnexa: no mass, fullness, tenderness               Rectovaginal: Confirms               Anus:  normal sphincter tone, no lesions  Pelvic floor: not tender  Bladder: not tender  Gae Dry, CMA chaperoned for the exam.  1. Combined abdominal and pelvic pain No concerning findings on exam - US PELVIS TRANSVAGINAL NON-OB (TV ONLY); Future  2. Vulvar lesion General findings concerning for lichen sclerosis, also small raised white lesion seen - Biopsy vulva; Future  She is due for a pap in 2/25

## 2023-01-04 ENCOUNTER — Encounter: Payer: Self-pay | Admitting: Obstetrics and Gynecology

## 2023-01-04 ENCOUNTER — Ambulatory Visit: Payer: Medicare HMO | Admitting: Obstetrics and Gynecology

## 2023-01-05 ENCOUNTER — Other Ambulatory Visit (HOSPITAL_COMMUNITY)
Admission: RE | Admit: 2023-01-05 | Discharge: 2023-01-05 | Disposition: A | Discharge status: Home / Self Care | Payer: Medicare HMO | Source: Ambulatory Visit | Attending: Obstetrics and Gynecology | Admitting: Obstetrics and Gynecology

## 2023-01-05 ENCOUNTER — Encounter: Payer: Self-pay | Admitting: Obstetrics and Gynecology

## 2023-01-05 ENCOUNTER — Ambulatory Visit: Payer: Medicare HMO | Admitting: Obstetrics and Gynecology

## 2023-01-05 VITALS — BP 112/84 | HR 77 | Wt 227.0 lb

## 2023-01-05 DIAGNOSIS — N9089 Other specified noninflammatory disorders of vulva and perineum: Secondary | ICD-10-CM

## 2023-01-05 DIAGNOSIS — L293 Anogenital pruritus, unspecified: Secondary | ICD-10-CM

## 2023-01-05 DIAGNOSIS — L900 Lichen sclerosus et atrophicus: Secondary | ICD-10-CM | POA: Diagnosis not present

## 2023-01-05 MED ORDER — BETAMETHASONE VALERATE 0.1 % EX OINT: 1.0000 | TOPICAL_OINTMENT | Freq: Two times a day (BID) | CUTANEOUS | 0 refills | Status: DC

## 2023-01-05 NOTE — Addendum Note (Signed)
Addended by: Dorothy Spark on: 01/05/2023 03:35 PM   Modules accepted: Orders

## 2023-01-05 NOTE — Progress Notes (Signed)
GYNECOLOGY  VISIT   HPI: 67 y.o.   Married White or Caucasian Not Hispanic or Latino  female   [phone] with No LMP recorded. Patient is postmenopausal.   here for vulvar biopsy. She was noted to have vulvar skin changes concerning for lichen sclerosis as well as a small raised white lesion on 2/27. She initially denied itching, but states she realizes she does intermittently have itching.   GYNECOLOGIC HISTORY: No LMP recorded. Patient is postmenopausal. 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   Bronchitis 01/29/2018   Cellulitis 11/14/2017   Uterine fibroid- noted on CT scan from 3\21\18 07/15/2017   Degenerative disc disease, lumbar- noted on CT 01-25-17 07/15/2017   Chronic hip pain- R 03/16/2017   Urinary incontinence, urge 03/16/2017   Diverticulosis of sigmoid colon 03/16/2017   Contusion of right hip- fell 12/22/16 01/02/2017   Greater trochanteric bursitis of right hip 09/23/2016   Chronic Right lower extremity edema 07/01/2016   Chronic foot pain 07/01/2016   Venous (peripheral) insufficiency- B\L L Ext 07/01/2016   Chronic pain in right foot 07/01/2016   Increased vitamin B12 level due to excess supplements 05/17/2016   Restless legs syndrome (RLS) 05/17/2016   seasonal or environmental allergies 05/16/2016   Insomnia 04/18/2016   Vitamin D deficiency 04/18/2016   Morbid obesity ([place]) 04/18/2016   GERD  (gastroesophageal reflux disease) 04/18/2016   hypothyroidism 04/15/2016   Chronic pain 11/30/2011    Past Medical History:  Diagnosis Date   Allergy    Chronic lower back pain    Fibroid    Insomnia    Restless leg syndrome    Thyroid disease    Vitamin D deficiency     Past Surgical History:  Procedure Laterality Date   ELBOW SURGERY Right 1999    Current Outpatient Medications  Medication Sig Dispense Refill   Acetaminophen-Caffeine (EXCEDRIN ASPIRIN FREE PO) Take by mouth daily as needed.     Ascorbic Acid (VITAMIN C) 1000 MG tablet Take 2,000 mg by mouth daily.     atorvastatin (LIPITOR) 20 MG tablet Take 1 tablet (20 mg total) by mouth at bedtime. (Patient not taking: Reported on 01/03/2023) 90 tablet 1   B Complex Vitamins (B COMPLEX-B12 PO) Take by mouth.     celecoxib (CELEBREX) 100 MG capsule Take 1 capsule (100 mg total) by mouth 2 (two) times daily. 60 capsule 0   Cholecalciferol (VITAMIN D3) 125 MCG (5000 UT) CAPS Take 1 capsule by mouth daily.     diphenhydramine-acetaminophen (TYLENOL PM) 25-500 MG TABS tablet Take 2 tablets by mouth at bedtime as needed.     hydrochlorothiazide (HYDRODIURIL) 25  MG tablet Take 1 tablet (25 mg total) by mouth daily. 90 tablet 0   ibuprofen (ADVIL) 200 MG tablet Take 400 mg by mouth every 6 (six) hours as needed.     levothyroxine (SYNTHROID) 100 MCG tablet TAKE 1 TABLET EVERY DAY BEFORE BREAKFAST 90 tablet 1   MAGNESIUM PO Take 1 tablet by mouth in the morning and at bedtime.      omeprazole (PRILOSEC) 20 MG capsule Take 20 mg by mouth daily.     vitamin B-12 (CYANOCOBALAMIN) 500 MCG tablet Take 500 mcg by mouth daily.     No current facility-administered medications for this visit.     ALLERGIES: Clindamycin/lincomycin  Family History  Problem Relation Age of Onset   Diabetes Mother    COPD Mother    Cancer Mother        lung   Multiple sclerosis Father    Heart disease Father    Cancer Sister        lung   Stroke  Sister    Cancer Brother        bladder   Stroke Sister    Leukemia Sister    Alcohol abuse Brother    Healthy Brother    Healthy Daughter    Leukemia Brother 5    Social History   Socioeconomic History   Marital status: Married    Spouse name: Not on file   Number of children: Not on file   Years of education: Not on file   Highest education level: Not on file  Occupational History   Not on file  Tobacco Use   Smoking status: Never   Smokeless tobacco: Never  Vaping Use   Vaping Use: Never used  Substance and Sexual Activity   Alcohol use: Yes    Alcohol/week: 5.0 standard drinks of alcohol    Types: 5 Shots of liquor per week   Drug use: No   Sexual activity: Yes    Partners: Male    Birth control/protection: Post-menopausal  Other Topics Concern   Not on file  Social History Narrative   Not on file   Social Determinants of Health   Financial Resource Strain: Not on file  Food Insecurity: Not on file  Transportation Needs: Not on file  Physical Activity: Not on file  Stress: Not on file  Social Connections: Not on file  Intimate Partner Violence: Not on file    ROS  PHYSICAL EXAMINATION:    There were no vitals taken for this visit.    General appearance: alert, cooperative and appears stated age  Pelvic: External genitalia:  several areas of whitening on the vulva, some agglutination, on the right labia majora is a slightly raised, white lesion.                Urethra:  normal appearing urethra with no masses, tenderness or lesions              Bartholins and Skenes: normal     The risks of the procedure were reviewed with the patient and a consent was signed. The lesion was cleansed with Hibiclens and injected with 1% lidocaine. A 4 mm punch biopsy was used to remove a circular piece of tissue. The defect was closed with 4-0 vicryl. The patient tolerated the procedure well.                 Chaperone, Gae Dry, CMA was present for exam.  1.  Vulvar lesion - Biopsy vulva  2. Genital pruritus Suspect lichen sclerosis, above biopsy includes some surrounding white skin.  - betamethasone valerate ointment (VALISONE) 0.1 %; Apply 1 Application topically 2 (two) times daily. Use for up to 2 weeks as needed  Dispense: 30 g; Refill: 0

## 2023-01-05 NOTE — Patient Instructions (Signed)

## 2023-01-09 LAB — SURGICAL PATHOLOGY

## 2023-01-10 ENCOUNTER — Other Ambulatory Visit: Payer: Self-pay

## 2023-01-10 DIAGNOSIS — L900 Lichen sclerosus et atrophicus: Secondary | ICD-10-CM

## 2023-01-10 DIAGNOSIS — L293 Anogenital pruritus, unspecified: Secondary | ICD-10-CM

## 2023-01-10 MED ORDER — BETAMETHASONE VALERATE 0.1 % EX OINT: TOPICAL_OINTMENT | CUTANEOUS | 0 refills | Status: AC

## 2023-01-12 ENCOUNTER — Encounter: Payer: Self-pay | Admitting: Obstetrics and Gynecology

## 2023-01-24 ENCOUNTER — Encounter: Payer: Self-pay | Admitting: Obstetrics and Gynecology

## 2023-01-24 ENCOUNTER — Ambulatory Visit (INDEPENDENT_AMBULATORY_CARE_PROVIDER_SITE_OTHER): Payer: Medicare HMO | Admitting: Obstetrics and Gynecology

## 2023-01-24 VITALS — BP 124/72 | HR 68 | Wt 227.0 lb

## 2023-01-24 DIAGNOSIS — L900 Lichen sclerosus et atrophicus: Secondary | ICD-10-CM

## 2023-01-24 DIAGNOSIS — N9089 Other specified noninflammatory disorders of vulva and perineum: Secondary | ICD-10-CM

## 2023-01-24 NOTE — Progress Notes (Signed)
GYNECOLOGY  VISIT   HPI: 67 y.o.   Married White or Caucasian Not Hispanic or Latino  female   [phone] with No LMP recorded. Patient is postmenopausal.   here to have her vulvar biopsy site check. She states that the site is uncomfortable   She had a vulvar punch biopsy on 01/05/23, pathology returned with lichen sclerosis.   GYNECOLOGIC HISTORY: No LMP recorded. Patient is postmenopausal. 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   Cellulitis 11/14/2017   Uterine fibroid- noted on CT scan from 3\21\18 07/15/2017   Degenerative disc disease, lumbar- noted on CT 01-25-17 07/15/2017   Urinary incontinence, urge 03/16/2017   Diverticulosis of sigmoid colon 03/16/2017   Contusion of right hip- fell 12/22/16 01/02/2017   Chronic Right lower extremity edema 07/01/2016   Venous (peripheral) insufficiency- B\L L Ext 07/01/2016   Chronic pain in right foot 07/01/2016   Increased vitamin B12 level due to excess supplements 05/17/2016   Restless legs syndrome (RLS) 05/17/2016   seasonal or environmental allergies 05/16/2016   Insomnia 04/18/2016   Vitamin D deficiency 04/18/2016   Morbid obesity ([place]) 04/18/2016   GERD (gastroesophageal reflux disease) 04/18/2016   hypothyroidism 04/15/2016    Past Medical History:  Diagnosis Date   Allergy    Chronic lower back pain    Fibroid    Insomnia    Restless leg syndrome    Thyroid  disease    Vitamin D deficiency     Past Surgical History:  Procedure Laterality Date   ELBOW SURGERY Right 1999    Current Outpatient Medications  Medication Sig Dispense Refill   Acetaminophen-Caffeine (EXCEDRIN ASPIRIN FREE PO) Take by mouth daily as needed.     Ascorbic Acid (VITAMIN C) 1000 MG tablet Take 2,000 mg by mouth daily.     B Complex Vitamins (B COMPLEX-B12 PO) Take by mouth.     betamethasone valerate ointment (VALISONE) 0.1 % Apply a pea sized amount topically 1-2 times weekly and BID for up to two weeks for a flare up. 30 g 0   Cholecalciferol (VITAMIN D3) 125 MCG (5000 UT) CAPS Take 1 capsule by mouth daily.     diphenhydramine-acetaminophen (TYLENOL PM) 25-500 MG TABS tablet Take 2 tablets by mouth at bedtime as needed.     hydrochlorothiazide (HYDRODIURIL) 25 MG tablet Take 1 tablet (25 mg total) by mouth daily. 90 tablet 0   ibuprofen (ADVIL) 200 MG tablet Take 400 mg by mouth every 6 (six) hours as needed.     levothyroxine (SYNTHROID) 100 MCG tablet TAKE 1 TABLET EVERY DAY BEFORE BREAKFAST 90 tablet 1   MAGNESIUM PO Take 1 tablet  by mouth in the morning and at bedtime.      omeprazole (PRILOSEC) 20 MG capsule Take 20 mg by mouth daily.     vitamin B-12 (CYANOCOBALAMIN) 500 MCG tablet Take 500 mcg by mouth daily.     No current facility-administered medications for this visit.     ALLERGIES: Clindamycin/lincomycin  Family History  Problem Relation Age of Onset   Diabetes Mother    COPD Mother    Cancer Mother        lung   Multiple sclerosis Father    Heart disease Father    Cancer Sister        lung   Stroke Sister    Cancer Brother        bladder   Stroke Sister    Leukemia Sister    Alcohol abuse Brother    Healthy Brother    Healthy Daughter    Leukemia Brother 5    Social History   Socioeconomic History   Marital status: Married    Spouse name: Not on file   Number of children: Not on file   Years of education: Not on file   Highest  education level: Not on file  Occupational History   Not on file  Tobacco Use   Smoking status: Never   Smokeless tobacco: Never  Vaping Use   Vaping Use: Never used  Substance and Sexual Activity   Alcohol use: Yes    Alcohol/week: 5.0 standard drinks of alcohol    Types: 5 Shots of liquor per week   Drug use: No   Sexual activity: Yes    Partners: Male    Birth control/protection: Post-menopausal  Other Topics Concern   Not on file  Social History Narrative   Not on file   Social Determinants of Health   Financial Resource Strain: Not on file  Food Insecurity: Not on file  Transportation Needs: Not on file  Physical Activity: Not on file  Stress: Not on file  Social Connections: Not on file  Intimate Partner Violence: Not on file    Review of Systems  All other systems reviewed and are negative.   PHYSICAL EXAMINATION:    BP 124/72   Pulse 68   Wt 227 lb (103 kg)   SpO2 98%   BMI 38.96 kg/m     General appearance: alert, cooperative and appears stated age  Pelvic: External genitalia:  no lesions. Several areas of whitening, some agglutination. No fissures, no erosions. Prior biopsy site has healed.               Urethra:  normal appearing urethra with no masses, tenderness or lesions               Chaperone was present for exam.  1. Vulvar irritation She feels focally irritated, no lesions, ulcerations or fissures. She does have signs of lichen sclerosis. -Recommended that she use the steroid ointment 2 x a day for up to 2 weeks as needed -Then change to using a small amount 1-2 x a week -Discussed use of Vaseline -Reviewed vulvar skin care, information was given  2. Lichen sclerosus See above.  -Needs yearly vulvar skin checks

## 2023-01-24 NOTE — Telephone Encounter (Signed)
Spoke with patient.  01/05/23 Vulvar biopsy.  Reports area still uncomfortable, sore when wiping. Feels like the area is swollen. Reports some itching. Thinks that stitch may have already come out. Denies fever/chills. Recommended OV for further evaluation, patient agreeable. OV scheduled for today at 1430 with Dr. Talbert Nan.   Routing to provider for final review. Patient is agreeable to disposition. Will close encounter.

## 2023-02-09 ENCOUNTER — Ambulatory Visit (INDEPENDENT_AMBULATORY_CARE_PROVIDER_SITE_OTHER): Payer: Medicare HMO | Admitting: Obstetrics and Gynecology

## 2023-02-09 ENCOUNTER — Encounter: Payer: Self-pay | Admitting: Obstetrics and Gynecology

## 2023-02-09 ENCOUNTER — Ambulatory Visit: Payer: Medicare HMO

## 2023-02-09 VITALS — BP 110/64 | Wt 226.0 lb

## 2023-02-09 DIAGNOSIS — R9389 Abnormal findings on diagnostic imaging of other specified body structures: Secondary | ICD-10-CM

## 2023-02-09 DIAGNOSIS — R109 Unspecified abdominal pain: Secondary | ICD-10-CM | POA: Diagnosis not present

## 2023-02-09 DIAGNOSIS — R102 Pelvic and perineal pain: Secondary | ICD-10-CM | POA: Diagnosis not present

## 2023-02-09 NOTE — Progress Notes (Signed)
GYNECOLOGY  VISIT   HPI: 67 y.o.   Married White or Caucasian Not Hispanic or Latino  female   [phone] with No LMP recorded. Patient is postmenopausal.   here for an ultrasound to evaluate for pelvic/abdominal pain. No vaginal bleeding.   GYNECOLOGIC HISTORY: No LMP recorded. Patient is postmenopausal. Contraception:PMP Menopausal hormone therapy: no        OB History     Gravida  2   Para  2   Term  2   Preterm      AB      Living  2      SAB      IAB      Ectopic      Multiple      Live Births  2              Patient Active Problem List   Diagnosis Date Noted   Elevated blood pressure reading 08/28/2019   Incontinence in female 08/17/2019   Urinary incontinence in female 12/25/2018   Hyperlipidemia 12/25/2018   History of non anemic vitamin B12 deficiency 03/13/2018   Prediabetes 03/13/2018   History of hypertension 03/13/2018   Elevated lipoprotein(a) 03/13/2018   Cellulitis 11/14/2017   Uterine fibroid- noted on CT scan from 3\21\18 07/15/2017   Degenerative disc disease, lumbar- noted on CT 01-25-17 07/15/2017   Urinary incontinence, urge 03/16/2017   Diverticulosis of sigmoid colon 03/16/2017   Contusion of right hip- fell 12/22/16 01/02/2017   Chronic Right lower extremity edema 07/01/2016   Venous (peripheral) insufficiency- B\L L Ext 07/01/2016   Chronic pain in right foot 07/01/2016   Increased vitamin B12 level due to excess supplements 05/17/2016   Restless legs syndrome (RLS) 05/17/2016   seasonal or environmental allergies 05/16/2016   Insomnia 04/18/2016   Vitamin D deficiency 04/18/2016   Morbid obesity 04/18/2016   GERD (gastroesophageal reflux disease) 04/18/2016   hypothyroidism 04/15/2016    Past Medical History:  Diagnosis Date   Allergy    Chronic lower back pain    Fibroid    Insomnia    Restless leg syndrome    Thyroid disease    Vitamin D deficiency     Past Surgical History:  Procedure Laterality Date   ELBOW  SURGERY Right 1999    Current Outpatient Medications  Medication Sig Dispense Refill   Acetaminophen-Caffeine (EXCEDRIN ASPIRIN FREE PO) Take by mouth daily as needed.     Ascorbic Acid (VITAMIN C) 1000 MG tablet Take 2,000 mg by mouth daily.     B Complex Vitamins (B COMPLEX-B12 PO) Take by mouth.     betamethasone valerate ointment (VALISONE) 0.1 % Apply a pea sized amount topically 1-2 times weekly and BID for up to two weeks for a flare up. 30 g 0   Cholecalciferol (VITAMIN D3) 125 MCG (5000 UT) CAPS Take 1 capsule by mouth daily.     diphenhydramine-acetaminophen (TYLENOL PM) 25-500 MG TABS tablet Take 2 tablets by mouth at bedtime as needed.     hydrochlorothiazide (HYDRODIURIL) 25 MG tablet Take 1 tablet (25 mg total) by mouth daily. 90 tablet 0   ibuprofen (ADVIL) 200 MG tablet Take 400 mg by mouth every 6 (six) hours as needed.     levothyroxine (SYNTHROID) 100 MCG tablet TAKE 1 TABLET EVERY DAY BEFORE BREAKFAST 90 tablet 1   MAGNESIUM PO Take 1 tablet by mouth in the morning and at bedtime.      omeprazole (PRILOSEC) 20 MG capsule Take 20  mg by mouth daily.     vitamin B-12 (CYANOCOBALAMIN) 500 MCG tablet Take 500 mcg by mouth daily.     No current facility-administered medications for this visit.     ALLERGIES: Clindamycin/lincomycin  Family History  Problem Relation Age of Onset   Diabetes Mother    COPD Mother    Cancer Mother        lung   Multiple sclerosis Father    Heart disease Father    Cancer Sister        lung   Stroke Sister    Cancer Brother        bladder   Stroke Sister    Leukemia Sister    Alcohol abuse Brother    Healthy Brother    Healthy Daughter    Leukemia Brother 5    Social History   Socioeconomic History   Marital status: Married    Spouse name: Not on file   Number of children: Not on file   Years of education: Not on file   Highest education level: Not on file  Occupational History   Not on file  Tobacco Use   Smoking status:  Never   Smokeless tobacco: Never  Vaping Use   Vaping Use: Never used  Substance and Sexual Activity   Alcohol use: Yes    Alcohol/week: 5.0 standard drinks of alcohol    Types: 5 Shots of liquor per week   Drug use: No   Sexual activity: Yes    Partners: Male    Birth control/protection: Post-menopausal  Other Topics Concern   Not on file  Social History Narrative   Not on file   Social Determinants of Health   Financial Resource Strain: Not on file  Food Insecurity: Not on file  Transportation Needs: Not on file  Physical Activity: Not on file  Stress: Not on file  Social Connections: Not on file  Intimate Partner Violence: Not on file    Pelvic ultrasound  Indications: pelvic pain  Findings:  Uterus 10.72 x 8.16 x 6.95 cm, anteverted Single large fibroid on the left, 7 x 6 x 6 cm (stable since 3/18 CT)  Endometrium 12.26 mm, irregular, cystis changes, feeder vessel identified. 3D imaging c/w intracavitary mass  Left ovary 3.10 x 0.97 x 1.10 cm  Right ovary 2.33 x 0.82 x 0.82 cm  No free fluid  Impression:  Fibroid uterus Thickened endometrium, concerning for endometrial polyp Atrophic appearing ovaries bilaterally   ROS  PHYSICAL EXAMINATION:    BP 110/64   Wt 226 lb (102.5 kg)   BMI 38.79 kg/m     General appearance: alert, cooperative and appears stated age  89. Combined abdominal and pelvic pain No gyn findings on u/s to explain her pain. Fibroid is stable. -She will f/u with her primary  2. Thickened endometrium Over 12 mm, mass noted on 3D imaging c/w endometrial polyp. Removal is recommended. -Will refer to GYN surgeon for hysteroscopy, D&C. ACOG handout given and procedure reviewed.

## 2023-02-10 ENCOUNTER — Other Ambulatory Visit: Payer: Self-pay | Admitting: *Deleted

## 2023-02-10 DIAGNOSIS — R9389 Abnormal findings on diagnostic imaging of other specified body structures: Secondary | ICD-10-CM

## 2023-02-14 DIAGNOSIS — R7303 Prediabetes: Secondary | ICD-10-CM | POA: Diagnosis not present

## 2023-02-14 DIAGNOSIS — E785 Hyperlipidemia, unspecified: Secondary | ICD-10-CM | POA: Diagnosis not present

## 2023-02-14 DIAGNOSIS — I10 Essential (primary) hypertension: Secondary | ICD-10-CM | POA: Diagnosis not present

## 2023-02-14 DIAGNOSIS — E039 Hypothyroidism, unspecified: Secondary | ICD-10-CM | POA: Diagnosis not present

## 2023-02-14 DIAGNOSIS — E559 Vitamin D deficiency, unspecified: Secondary | ICD-10-CM | POA: Diagnosis not present

## 2023-02-14 DIAGNOSIS — M25569 Pain in unspecified knee: Secondary | ICD-10-CM | POA: Diagnosis not present

## 2023-02-17 ENCOUNTER — Encounter: Payer: Self-pay | Admitting: Obstetrics and Gynecology

## 2023-02-27 ENCOUNTER — Encounter: Payer: Self-pay | Admitting: Obstetrics and Gynecology

## 2023-03-15 ENCOUNTER — Ambulatory Visit: Payer: Medicare HMO | Admitting: Podiatry

## 2023-03-20 DIAGNOSIS — M25569 Pain in unspecified knee: Secondary | ICD-10-CM | POA: Diagnosis not present

## 2023-03-20 DIAGNOSIS — I10 Essential (primary) hypertension: Secondary | ICD-10-CM | POA: Diagnosis not present

## 2023-03-20 DIAGNOSIS — R7303 Prediabetes: Secondary | ICD-10-CM | POA: Diagnosis not present

## 2023-03-20 DIAGNOSIS — E785 Hyperlipidemia, unspecified: Secondary | ICD-10-CM | POA: Diagnosis not present

## 2023-05-18 ENCOUNTER — Encounter (HOSPITAL_BASED_OUTPATIENT_CLINIC_OR_DEPARTMENT_OTHER): Payer: Medicare HMO | Admitting: Obstetrics & Gynecology

## 2023-05-30 ENCOUNTER — Ambulatory Visit (HOSPITAL_BASED_OUTPATIENT_CLINIC_OR_DEPARTMENT_OTHER): Payer: Medicare HMO | Admitting: Obstetrics & Gynecology

## 2023-05-30 ENCOUNTER — Encounter (HOSPITAL_BASED_OUTPATIENT_CLINIC_OR_DEPARTMENT_OTHER): Payer: Self-pay | Admitting: Obstetrics & Gynecology

## 2023-05-30 VITALS — BP 146/70 | HR 62 | Ht 63.0 in | Wt 216.8 lb

## 2023-05-30 DIAGNOSIS — R9389 Abnormal findings on diagnostic imaging of other specified body structures: Secondary | ICD-10-CM

## 2023-05-30 DIAGNOSIS — N840 Polyp of corpus uteri: Secondary | ICD-10-CM | POA: Diagnosis not present

## 2023-06-02 NOTE — Progress Notes (Signed)
GYNECOLOGY  VISIT  CC:   surgical consultation  HPI: 67 y.o. [id number] Married White or Caucasian female here for discussion of recommended surgery.  She is a new patient who was seen by Dr. Gertie Exon.  Dr. Oscar La has recently retired.  Initially pt underwent ultrasound 02/2023 due to pelvic pain.  Uterus was 10.7 x 8.1 x 6.9cm with single large fibroid on the left measuring 7 x 6 x 7cm.  This fibroid has been present since at least 2018 when seen on CT imaging.  Endometrium was 12.75mm and findings were consistent with an endometrial biopsy.  She has not had any bleeding but given size of endometrium and polyp, hysteroscopy with polyp resection, D&C were planned.    Procedure discussed with patient.  Recovery and pain management discussed.  Risks discussed including but not limited to bleeding, rare risk of transfusion, infection, 1% risk of uterine perforation with risks of fluid deficit causing cardiac arrythmia, cerebral swelling and/or need to stop procedure early.  Fluid emboli and rare risk of death discussed.  DVT/PE, rare risk of risk of bowel/bladder/ureteral/vascular injury.  Patient aware if pathology abnormal she may need additional treatment.  All questions answered.  She does want to proceed with scheduling.   Past Medical History:  Diagnosis Date   Allergy    Chronic lower back pain    Fibroid    Insomnia    Restless leg syndrome    Thyroid disease    Vitamin D deficiency     MEDS:   Current Outpatient Medications on File Prior to Visit  Medication Sig Dispense Refill   Acetaminophen-Caffeine (EXCEDRIN ASPIRIN FREE PO) Take by mouth daily as needed.     Ascorbic Acid (VITAMIN C) 1000 MG tablet Take 2,000 mg by mouth daily.     B Complex Vitamins (B COMPLEX-B12 PO) Take by mouth.     betamethasone valerate ointment (VALISONE) 0.1 % Apply a pea sized amount topically 1-2 times weekly and BID for up to two weeks for a flare up. 30 g 0   Cholecalciferol (VITAMIN D3) 125 MCG  (5000 UT) CAPS Take 1 capsule by mouth daily.     diphenhydramine-acetaminophen (TYLENOL PM) 25-500 MG TABS tablet Take 2 tablets by mouth at bedtime as needed.     hydrochlorothiazide (HYDRODIURIL) 25 MG tablet Take 1 tablet (25 mg total) by mouth daily. 90 tablet 0   ibuprofen (ADVIL) 200 MG tablet Take 400 mg by mouth every 6 (six) hours as needed.     levothyroxine (SYNTHROID) 100 MCG tablet TAKE 1 TABLET EVERY DAY BEFORE BREAKFAST 90 tablet 1   MAGNESIUM PO Take 1 tablet by mouth in the morning and at bedtime.      omeprazole (PRILOSEC) 20 MG capsule Take 20 mg by mouth daily.     vitamin B-12 (CYANOCOBALAMIN) 500 MCG tablet Take 500 mcg by mouth daily.     No current facility-administered medications on file prior to visit.    ALLERGIES: Clindamycin/lincomycin  SH:  married, non smoker  Review of Systems  Constitutional: Negative.   Genitourinary: Negative.     PHYSICAL EXAMINATION:    BP (!) 146/70 (BP Location: Left Arm, Patient Position: Sitting, Cuff Size: Large)   Pulse 62   Ht 5\' 3"  (1.6 m) Comment: Reported  Wt 216 lb 12.8 oz (98.3 kg)   BMI 38.40 kg/m     Physical Exam Constitutional:      Appearance: Normal appearance.  Cardiovascular:     Rate and Rhythm:  Normal rate and regular rhythm.     Pulses: Normal pulses.  Pulmonary:     Effort: Pulmonary effort is normal.     Breath sounds: Normal breath sounds.  Neurological:     General: No focal deficit present.     Mental Status: She is alert.  Psychiatric:        Behavior: Behavior normal.     Assessment/Plan: 1. Thickened endometrium - will proceed with surgical scheduling for hysteroscopy with polyp resection, D&C.  Message sent to surgical scheduler. - medications reviewed.    2. Endometrial polyp

## 2023-06-08 ENCOUNTER — Telehealth (HOSPITAL_BASED_OUTPATIENT_CLINIC_OR_DEPARTMENT_OTHER): Payer: Self-pay | Admitting: *Deleted

## 2023-06-08 NOTE — Telephone Encounter (Signed)
Patient called and left a message that she waiting on someone to call to schedule her for procedure.

## 2023-06-12 ENCOUNTER — Telehealth (HOSPITAL_BASED_OUTPATIENT_CLINIC_OR_DEPARTMENT_OTHER): Payer: Self-pay | Admitting: *Deleted

## 2023-06-12 NOTE — Telephone Encounter (Signed)
Patient called and would like for the  nurse to give her a call .

## 2023-06-12 NOTE — Telephone Encounter (Signed)
Called pt to let her know that the surgery scheduler has her name on list of patients to call and will reach out to her for scheduling. Pt verbalized understanding.

## 2023-06-23 ENCOUNTER — Telehealth: Payer: Self-pay

## 2023-06-23 ENCOUNTER — Encounter (HOSPITAL_BASED_OUTPATIENT_CLINIC_OR_DEPARTMENT_OTHER): Payer: Self-pay

## 2023-06-23 NOTE — Telephone Encounter (Signed)
Reached out to patient to schedule surgery with Dr. Hyacinth Meeker. Patient chose 07/19/23 at [facility] at noon. Patient confirmed her arrival time is 10 am. Pre-op instructions were provided over the phone.

## 2023-06-30 DIAGNOSIS — R1011 Right upper quadrant pain: Secondary | ICD-10-CM | POA: Diagnosis not present

## 2023-06-30 DIAGNOSIS — R197 Diarrhea, unspecified: Secondary | ICD-10-CM | POA: Diagnosis not present

## 2023-06-30 DIAGNOSIS — E876 Hypokalemia: Secondary | ICD-10-CM | POA: Diagnosis not present

## 2023-07-03 ENCOUNTER — Other Ambulatory Visit: Payer: Self-pay | Admitting: Adult Health

## 2023-07-03 DIAGNOSIS — R1011 Right upper quadrant pain: Secondary | ICD-10-CM

## 2023-07-03 DIAGNOSIS — R197 Diarrhea, unspecified: Secondary | ICD-10-CM

## 2023-07-06 ENCOUNTER — Ambulatory Visit
Admission: RE | Admit: 2023-07-06 | Discharge: 2023-07-06 | Disposition: A | Discharge status: Home / Self Care | Payer: Medicare HMO | Source: Ambulatory Visit | Attending: Adult Health | Admitting: Adult Health

## 2023-07-06 ENCOUNTER — Other Ambulatory Visit (HOSPITAL_BASED_OUTPATIENT_CLINIC_OR_DEPARTMENT_OTHER): Payer: Self-pay | Admitting: Obstetrics & Gynecology

## 2023-07-06 DIAGNOSIS — R1011 Right upper quadrant pain: Secondary | ICD-10-CM | POA: Diagnosis not present

## 2023-07-06 DIAGNOSIS — Z01818 Encounter for other preprocedural examination: Secondary | ICD-10-CM

## 2023-07-06 DIAGNOSIS — K8020 Calculus of gallbladder without cholecystitis without obstruction: Secondary | ICD-10-CM | POA: Diagnosis not present

## 2023-07-06 DIAGNOSIS — R9389 Abnormal findings on diagnostic imaging of other specified body structures: Secondary | ICD-10-CM

## 2023-07-06 DIAGNOSIS — R197 Diarrhea, unspecified: Secondary | ICD-10-CM

## 2023-07-07 ENCOUNTER — Encounter (HOSPITAL_BASED_OUTPATIENT_CLINIC_OR_DEPARTMENT_OTHER): Payer: Self-pay | Admitting: Obstetrics & Gynecology

## 2023-07-07 NOTE — Progress Notes (Signed)
Spoke w/ via phone for pre-op interview--- pt Lab needs dos---- cbc, istat, ekg              Lab results------ no COVID test -----patient states asymptomatic no test needed Arrive at ------- 1045 on 07-19-2023 NPO after MN NO Solid Food.  Clear liquids from MN until--- 0945 Med rec completed Medications to take morning of surgery ----- synthroid, prilosec, potassium Diabetic medication ----- do not take metformin morning of surgery Patient instructed no nail polish to be worn day of surgery Patient instructed to bring photo id and insurance card day of surgery Patient aware to have Driver (ride ) / caregiver    for 24 hours after surgery -- husband, eric Patient Special Instructions ----- n/a Pre-Op special Instructions ----- n/a Patient verbalized understanding of instructions that were given at this phone interview. Patient denies shortness of breath, chest pain, fever, cough at this phone interview.

## 2023-07-12 ENCOUNTER — Other Ambulatory Visit: Payer: Medicare HMO

## 2023-07-19 ENCOUNTER — Other Ambulatory Visit: Payer: Self-pay

## 2023-07-19 ENCOUNTER — Ambulatory Visit (HOSPITAL_BASED_OUTPATIENT_CLINIC_OR_DEPARTMENT_OTHER): Payer: Medicare HMO | Admitting: Anesthesiology

## 2023-07-19 ENCOUNTER — Ambulatory Visit (HOSPITAL_BASED_OUTPATIENT_CLINIC_OR_DEPARTMENT_OTHER)
Admission: RE | Admit: 2023-07-19 | Discharge: 2023-07-19 | Disposition: A | Discharge status: Home / Self Care | Payer: Medicare HMO | Attending: Obstetrics & Gynecology | Admitting: Obstetrics & Gynecology

## 2023-07-19 ENCOUNTER — Encounter (HOSPITAL_BASED_OUTPATIENT_CLINIC_OR_DEPARTMENT_OTHER)
Admission: RE | Disposition: A | Discharge status: Home / Self Care | Payer: Self-pay | Source: Home / Self Care | Attending: Obstetrics & Gynecology

## 2023-07-19 ENCOUNTER — Other Ambulatory Visit (HOSPITAL_COMMUNITY): Admission: RE | Admit: 2023-07-19 | Payer: Medicare HMO | Source: Ambulatory Visit

## 2023-07-19 ENCOUNTER — Encounter (HOSPITAL_BASED_OUTPATIENT_CLINIC_OR_DEPARTMENT_OTHER): Payer: Self-pay | Admitting: Obstetrics & Gynecology

## 2023-07-19 DIAGNOSIS — N840 Polyp of corpus uteri: Secondary | ICD-10-CM | POA: Insufficient documentation

## 2023-07-19 DIAGNOSIS — K219 Gastro-esophageal reflux disease without esophagitis: Secondary | ICD-10-CM | POA: Insufficient documentation

## 2023-07-19 DIAGNOSIS — R9389 Abnormal findings on diagnostic imaging of other specified body structures: Secondary | ICD-10-CM | POA: Diagnosis not present

## 2023-07-19 DIAGNOSIS — Z124 Encounter for screening for malignant neoplasm of cervix: Secondary | ICD-10-CM | POA: Insufficient documentation

## 2023-07-19 DIAGNOSIS — N841 Polyp of cervix uteri: Secondary | ICD-10-CM | POA: Diagnosis not present

## 2023-07-19 DIAGNOSIS — G2581 Restless legs syndrome: Secondary | ICD-10-CM | POA: Insufficient documentation

## 2023-07-19 DIAGNOSIS — Z01818 Encounter for other preprocedural examination: Secondary | ICD-10-CM

## 2023-07-19 DIAGNOSIS — Z79899 Other long term (current) drug therapy: Secondary | ICD-10-CM | POA: Insufficient documentation

## 2023-07-19 DIAGNOSIS — M1990 Unspecified osteoarthritis, unspecified site: Secondary | ICD-10-CM | POA: Insufficient documentation

## 2023-07-19 DIAGNOSIS — I10 Essential (primary) hypertension: Secondary | ICD-10-CM | POA: Diagnosis not present

## 2023-07-19 DIAGNOSIS — E039 Hypothyroidism, unspecified: Secondary | ICD-10-CM | POA: Diagnosis not present

## 2023-07-19 HISTORY — DX: Other intervertebral disc degeneration, lumbar region: M51.36

## 2023-07-19 HISTORY — DX: Polyp of corpus uteri: N84.0

## 2023-07-19 HISTORY — DX: Hypothyroidism, unspecified: E03.9

## 2023-07-19 HISTORY — DX: Presence of dental prosthetic device (complete) (partial): Z97.2

## 2023-07-19 HISTORY — DX: Prediabetes: R73.03

## 2023-07-19 HISTORY — DX: Abnormal findings on diagnostic imaging of other abdominal regions, including retroperitoneum: R93.5

## 2023-07-19 HISTORY — DX: Leukoplakia of vulva: N90.4

## 2023-07-19 HISTORY — DX: Leiomyoma of uterus, unspecified: D25.9

## 2023-07-19 HISTORY — DX: Hyperlipidemia, unspecified: E78.5

## 2023-07-19 HISTORY — DX: Unspecified osteoarthritis, unspecified site: M19.90

## 2023-07-19 HISTORY — DX: Presence of spectacles and contact lenses: Z97.3

## 2023-07-19 HISTORY — PX: DILATATION & CURETTAGE/HYSTEROSCOPY WITH MYOSURE: SHX6511

## 2023-07-19 HISTORY — DX: Other intervertebral disc degeneration, lumbar region without mention of lumbar back pain or lower extremity pain: M51.369

## 2023-07-19 LAB — POCT I-STAT, CHEM 8
BUN: 20 mg/dL (ref 8–23)
Calcium, Ion: 1.18 mmol/L (ref 1.15–1.40)
Chloride: 98 mmol/L (ref 98–111)
Creatinine, Ser: 0.9 mg/dL (ref 0.44–1.00)
Glucose, Bld: 121 mg/dL — ABNORMAL HIGH (ref 70–99)
HCT: 43 % (ref 36.0–46.0)
Hemoglobin: 14.6 g/dL (ref 12.0–15.0)
Potassium: 3.6 mmol/L (ref 3.5–5.1)
Sodium: 140 mmol/L (ref 135–145)
TCO2: 29 mmol/L (ref 22–32)

## 2023-07-19 LAB — CBC
HCT: 42.8 % (ref 36.0–46.0)
Hemoglobin: 14.2 g/dL (ref 12.0–15.0)
MCH: 28 pg (ref 26.0–34.0)
MCHC: 33.2 g/dL (ref 30.0–36.0)
MCV: 84.4 fL (ref 80.0–100.0)
Platelets: 240 10*3/uL (ref 150–400)
RBC: 5.07 MIL/uL (ref 3.87–5.11)
RDW: 12.9 % (ref 11.5–15.5)
WBC: 6.2 10*3/uL (ref 4.0–10.5)
nRBC: 0 % (ref 0.0–0.2)

## 2023-07-19 SURGERY — DILATATION & CURETTAGE/HYSTEROSCOPY WITH MYOSURE
Anesthesia: General | Site: Vagina

## 2023-07-19 MED ORDER — VASOPRESSIN 20 UNIT/ML IV SOLN: INTRAVENOUS | Status: DC | PRN

## 2023-07-19 MED ORDER — FENTANYL CITRATE (PF) 100 MCG/2ML IJ SOLN: 25.0000 ug | INTRAMUSCULAR | Status: DC | PRN

## 2023-07-19 MED ORDER — FENTANYL CITRATE (PF) 100 MCG/2ML IJ SOLN
INTRAMUSCULAR | Status: DC | PRN
  Administered 2023-07-19 (×2): 25 ug via INTRAVENOUS
  Administered 2023-07-19: 50 ug via INTRAVENOUS

## 2023-07-19 MED ORDER — PROPOFOL 10 MG/ML IV BOLUS
INTRAVENOUS | Status: DC | PRN
  Administered 2023-07-19: 200 mg via INTRAVENOUS

## 2023-07-19 MED ORDER — LIDOCAINE HCL (CARDIAC) PF 100 MG/5ML IV SOSY
PREFILLED_SYRINGE | INTRAVENOUS | Status: DC | PRN
  Administered 2023-07-19: 100 mg via INTRAVENOUS

## 2023-07-19 MED ORDER — SODIUM CHLORIDE 0.9 % IR SOLN
Status: DC | PRN
  Administered 2023-07-19: 3000 mL

## 2023-07-19 MED ORDER — DEXAMETHASONE SODIUM PHOSPHATE 4 MG/ML IJ SOLN
INTRAMUSCULAR | Status: DC | PRN
  Administered 2023-07-19: 5 mg via INTRAVENOUS

## 2023-07-19 MED ORDER — ACETAMINOPHEN 500 MG PO TABS
1000.0000 mg | ORAL_TABLET | ORAL | Status: AC
  Administered 2023-07-19: 1000 mg via ORAL

## 2023-07-19 MED ORDER — ACETAMINOPHEN 500 MG PO TABS: 1000.0000 mg | ORAL_TABLET | Freq: Once | ORAL | Status: DC

## 2023-07-19 MED ORDER — LIDOCAINE-EPINEPHRINE 1 %-1:100000 IJ SOLN
INTRAMUSCULAR | Status: DC | PRN
  Administered 2023-07-19: 10 mL

## 2023-07-19 MED ORDER — ACETAMINOPHEN 500 MG PO TABS
ORAL_TABLET | ORAL | Status: AC
  Filled 2023-07-19: qty 2

## 2023-07-19 MED ORDER — LACTATED RINGERS IV SOLN: INTRAVENOUS | Status: DC

## 2023-07-19 MED ORDER — FENTANYL CITRATE (PF) 100 MCG/2ML IJ SOLN
INTRAMUSCULAR | Status: AC
  Filled 2023-07-19: qty 2

## 2023-07-19 MED ORDER — HYDROCODONE-ACETAMINOPHEN 5-325 MG PO TABS
1.0000 | ORAL_TABLET | Freq: Four times a day (QID) | ORAL | 0 refills | Status: AC | PRN
Start: 2023-07-19 — End: ?

## 2023-07-19 MED ORDER — OXYCODONE HCL 5 MG/5ML PO SOLN: 5.0000 mg | Freq: Once | ORAL | Status: DC | PRN

## 2023-07-19 MED ORDER — ONDANSETRON HCL 4 MG/2ML IJ SOLN
INTRAMUSCULAR | Status: DC | PRN
  Administered 2023-07-19: 4 mg via INTRAVENOUS

## 2023-07-19 MED ORDER — KETOROLAC TROMETHAMINE 30 MG/ML IJ SOLN
INTRAMUSCULAR | Status: DC | PRN
  Administered 2023-07-19: 15 mg via INTRAVENOUS

## 2023-07-19 MED ORDER — OXYCODONE HCL 5 MG PO TABS: 5.0000 mg | ORAL_TABLET | Freq: Once | ORAL | Status: DC | PRN

## 2023-07-19 MED ORDER — POVIDONE-IODINE 10 % EX SWAB: 2.0000 | Freq: Once | CUTANEOUS | Status: DC

## 2023-07-19 MED ORDER — IBUPROFEN 600 MG PO TABS: 600.0000 mg | ORAL_TABLET | Freq: Four times a day (QID) | ORAL | 0 refills | Status: AC | PRN

## 2023-07-19 SURGICAL SUPPLY — 17 items
CATH ROBINSON RED A/P 16FR (CATHETERS) ×1 IMPLANT
DEVICE MYOSURE LITE (MISCELLANEOUS) IMPLANT
DEVICE MYOSURE REACH (MISCELLANEOUS) IMPLANT
DILATOR CANAL MILEX (MISCELLANEOUS) IMPLANT
GLOVE BIOGEL PI IND STRL 7.0 (GLOVE) ×1 IMPLANT
GLOVE ECLIPSE 6.5 STRL STRAW (GLOVE) ×1 IMPLANT
GOWN STRL REUS W/TWL LRG LVL3 (GOWN DISPOSABLE) ×1 IMPLANT
IV NS IRRIG 3000ML ARTHROMATIC (IV SOLUTION) ×1 IMPLANT
KIT PROCEDURE FLUENT (KITS) ×1 IMPLANT
KIT TURNOVER CYSTO (KITS) ×1 IMPLANT
PACK VAGINAL MINOR WOMEN LF (CUSTOM PROCEDURE TRAY) ×1 IMPLANT
PAD OB MATERNITY 4.3X12.25 (PERSONAL CARE ITEMS) ×1 IMPLANT
SEAL CERVICAL OMNI LOK (ABLATOR) IMPLANT
SEAL ROD LENS SCOPE MYOSURE (ABLATOR) ×1 IMPLANT
SLEEVE SCD COMPRESS KNEE MED (STOCKING) ×1 IMPLANT
TOWEL OR 17X24 6PK STRL BLUE (TOWEL DISPOSABLE) ×1 IMPLANT
WATER STERILE IRR 500ML POUR (IV SOLUTION) ×1 IMPLANT

## 2023-07-19 NOTE — H&P (Signed)
Dawn Alexander is an 67 y.o. female.G2P2 MWF here for hysteroscopy with probable polyp resection, D&C due to endometrial polyp that was seen on ultrasound.  Ultrasound was done 02/2023 and uterus measured 10.7 x 8.1 x 6.9cm with a 7cm fibroid that has been present since at least 2018 with CT imaging.  Endometrium was 12.33mm with cystic spaces c/w polyp. Although she is not having any bleeding, due to the size, hysteroscopy with resection and D&C was recommended.  Pt has been counseled about risks, benefits and alteratives and has decided to proceed.     Pertinent Gynecological History: Menses: post-menopausal Bleeding: none Contraception: post menopausal status DES exposure: denies Blood transfusions: none Sexually transmitted diseases: no past history Previous GYN Procedures:  none   Last mammogram: normal Date: 08/2021.  Pt reports had MMG last year at [facility] as well. Last pap: normal Date: 12/2018 OB History: G2, P2   Menstrual History: No LMP recorded. Patient is postmenopausal.    Past Medical History:  Diagnosis Date   Abnormal endometrial ultrasound    Chronic lower back pain    DDD (degenerative disc disease), lumbar    Endometrial polyp    Hyperlipidemia    Hypertension    Hypothyroidism    followed by pcp   Insomnia    Lichen sclerosus of vulva    OA (osteoarthritis)    Pre-diabetes    Restless leg syndrome    Uterine fibroid    Vitamin D deficiency    Wears contact lenses    Wears dentures    upper only    Past Surgical History:  Procedure Laterality Date   TENNIS ELBOW RELEASE/NIRSCHEL PROCEDURE Right 1988    Family History  Problem Relation Age of Onset   Diabetes Mother    COPD Mother    Cancer Mother        lung   Multiple sclerosis Father    Heart disease Father    Cancer Sister        lung   Stroke Sister    Cancer Brother        bladder   Stroke Sister    Leukemia Sister    Alcohol abuse Brother    Healthy Brother    Healthy Daughter     Leukemia Brother 5    Social History:  reports that she has never smoked. She has never used smokeless tobacco. She reports that she does not currently use alcohol. She reports that she does not use drugs.  Allergies:  Allergies  Allergen Reactions   Clindamycin/Lincomycin Rash    Medications Prior to Admission  Medication Sig Dispense Refill Last Dose   Acetaminophen-Caffeine (EXCEDRIN ASPIRIN FREE PO) Take by mouth daily as needed.   07/18/2023   Ascorbic Acid (VITAMIN C) 500 MG CAPS Take 1 capsule by mouth daily.   07/18/2023   atorvastatin (LIPITOR) 20 MG tablet Take 20 mg by mouth at bedtime.   07/18/2023   B Complex Vitamins (B COMPLEX-B12 PO) Take 1 capsule by mouth daily.   07/18/2023   Cyanocobalamin (B-12) 5000 MCG CAPS Take 1 capsule by mouth daily.   07/18/2023   diphenhydrAMINE HCl, Sleep, (SLEEP-AID) 25 MG CAPS Take 2 capsules by mouth at bedtime as needed.   07/18/2023   hydrochlorothiazide (HYDRODIURIL) 25 MG tablet Take 1 tablet (25 mg total) by mouth daily. (Patient taking differently: Take 25 mg by mouth daily.) 90 tablet 0 07/18/2023   ibuprofen (ADVIL) 200 MG tablet Take 400 mg by mouth every 6 (  six) hours as needed for fever, headache or mild pain.   Past Week   levothyroxine (SYNTHROID) 100 MCG tablet TAKE 1 TABLET EVERY DAY BEFORE BREAKFAST (Patient taking differently: Take 100 mcg by mouth daily before breakfast.) 90 tablet 1 07/19/2023   Magnesium 400 MG CAPS Take 1 capsule by mouth daily.   07/18/2023   metFORMIN (GLUCOPHAGE) 500 MG tablet Take 500 mg by mouth 2 (two) times daily with a meal.   07/18/2023   omeprazole (PRILOSEC) 20 MG capsule Take 20 mg by mouth daily.   07/19/2023   potassium chloride (KLOR-CON M) 10 MEQ tablet Take 10 mEq by mouth daily.   07/19/2023   triamcinolone cream (KENALOG) 0.1 % Apply 1 Application topically as needed.      betamethasone valerate ointment (VALISONE) 0.1 % Apply a pea sized amount topically 1-2 times weekly and BID for up to two  weeks for a flare up. (Patient taking differently: as needed. Apply a pea sized amount topically 1-2 times weekly and BID for up to two weeks for a flare up.) 30 g 0     Review of Systems  Constitutional: Negative.   Genitourinary: Negative.     Blood pressure 138/82, pulse 72, temperature 98 F (36.7 C), temperature source Oral, resp. rate 18, height 5\' 3"  (1.6 m), weight 95.3 kg, SpO2 95%. Physical Exam Constitutional:      Appearance: Normal appearance.  Cardiovascular:     Rate and Rhythm: Normal rate and regular rhythm.  Pulmonary:     Effort: Pulmonary effort is normal.     Breath sounds: Normal breath sounds.  Neurological:     General: No focal deficit present.     Mental Status: She is alert.  Psychiatric:        Mood and Affect: Mood normal.        Behavior: Behavior normal.     Results for orders placed or performed during the hospital encounter of 07/19/23 (from the past 24 hour(s))  CBC     Status: None   Collection Time: 07/19/23 11:13 AM  Result Value Ref Range   WBC 6.2 4.0 - 10.5 K/uL   RBC 5.07 3.87 - 5.11 MIL/uL   Hemoglobin 14.2 12.0 - 15.0 g/dL   HCT 95.6 21.3 - 08.6 %   MCV 84.4 80.0 - 100.0 fL   MCH 28.0 26.0 - 34.0 pg   MCHC 33.2 30.0 - 36.0 g/dL   RDW 57.8 46.9 - 62.9 %   Platelets 240 150 - 400 K/uL   nRBC 0.0 0.0 - 0.2 %  I-STAT, chem 8     Status: Abnormal   Collection Time: 07/19/23 11:17 AM  Result Value Ref Range   Sodium 140 135 - 145 mmol/L   Potassium 3.6 3.5 - 5.1 mmol/L   Chloride 98 98 - 111 mmol/L   BUN 20 8 - 23 mg/dL   Creatinine, Ser 5.28 0.44 - 1.00 mg/dL   Glucose, Bld 413 (H) 70 - 99 mg/dL   Calcium, Ion 2.44 0.10 - 1.40 mmol/L   TCO2 29 22 - 32 mmol/L   Hemoglobin 14.6 12.0 - 15.0 g/dL   HCT 27.2 53.6 - 64.4 %    No results found.  Assessment/Plan: 67 yo G2P2 MWF with thickened endometrium and findings c/w polyp here for hysteroscopy with probable polyp resection, D&C.  Questions answered.  Pt ready to  proceed.  Jerene Bears 07/19/2023, 12:17 PM

## 2023-07-19 NOTE — Discharge Instructions (Addendum)
Post-surgical Instructions, Outpatient Surgery  You may expect to feel dizzy, weak, and drowsy for as long as 24 hours after receiving the medicine that made you sleep (anesthetic). For the first 24 hours after your surgery:   Do not drive a car, ride a bicycle, participate in physical activities, or take public transportation until you are done taking narcotic pain medicines or as directed by Dr. Hyacinth Meeker.  Do not drink alcohol or take tranquilizers.  Do not take medicine that has not been prescribed by your physicians.  Do not sign important papers or make important decisions while on narcotic pain medicines.  Have a responsible person with you.   PAIN MANAGEMENT Motrin 600mg .  (This is the same as 3-200mg  over the counter tablets of Motrin or ibuprofen.)  You may take this every 6 hours or as needed for cramping.   Vicodin 5/325mg .  For more severe pain, take one or two tablets every four to six hours as needed for pain control.  (Remember that narcotic pain medications increase your risk of constipation.  If this becomes a problem, you may take an over the counter stool softener like Colace 100mg  up to four times a day.) You can also use Tylenol regular 325mg , 3 tablets every 6 hours, OR extra strength 500mg , 2 tablets every 6 hours as needed.  If you take Tylenol, do not take any of the narcotic pain medication for at least 6 hours as it also contains tylenol.  DO'S AND DON'T'S Do not take a tub bath for one week.  You may shower on the first day after your surgery Do not do any heavy lifting for one to two weeks.  This increases the chance of bleeding. Do move around as you feel able.  Stairs are fine.  You may begin to exercise again as you feel able.  Do not lift any weights for two weeks. Do not put anything in the vagina for two weeks--no tampons, intercourse, or douching.  If you have external irritation, you can use anything with an ointment to help sooth this including hydrocortisone  over the counter, vaseline or aquaphor (which is over the counter).  After 5 days, you can use Replens vaginal moisturizer or coconut oil inside the vagina if needed.  REGULAR MEDIATIONS/VITAMINS: You may restart all of your regular medications as prescribed. You may restart all of your vitamins as you normally take them.    PLEASE CALL OR SEEK MEDICAL CARE IF: You have persistent nausea and vomiting.  You have trouble eating or drinking.  You have an oral temperature above 100.5.  You have constipation that is not helped by adjusting diet or increasing fluid intake. Pain medicines are a common cause of constipation.  You have heavy vaginal bleeding           No acetaminophen/Tylenol until after 5:10 pm today if needed.  No ibuprofen, Advil, Aleve, Motrin, ketorolac, meloxicam, naproxen, or other NSAIDS until after 7:15 pm today if needed.     Post Anesthesia Home Care Instructions  Activity: Get plenty of rest for the remainder of the day. A responsible individual must stay with you for 24 hours following the procedure.  For the next 24 hours, DO NOT: -Drive a car -Advertising copywriter -Drink alcoholic beverages -Take any medication unless instructed by your physician -Make any legal decisions or sign important papers.  Meals: Start with liquid foods such as gelatin or soup. Progress to regular foods as tolerated. Avoid greasy, spicy, heavy foods. If  nausea and/or vomiting occur, drink only clear liquids until the nausea and/or vomiting subsides. Call your physician if vomiting continues.  Special Instructions/Symptoms: Your throat may feel dry or sore from the anesthesia or the breathing tube placed in your throat during surgery. If this causes discomfort, gargle with warm salt water. The discomfort should disappear within 24 hours.

## 2023-07-19 NOTE — Anesthesia Preprocedure Evaluation (Addendum)
Anesthesia Evaluation  Patient identified by MRN, date of birth, ID band Patient awake    Reviewed: Allergy & Precautions, NPO status , Patient's Chart, lab work & pertinent test results  History of Anesthesia Complications Negative for: history of anesthetic complications  Airway Mallampati: II  TM Distance: >3 FB Neck ROM: Full    Dental  (+) Edentulous Upper   Pulmonary neg pulmonary ROS   Pulmonary exam normal        Cardiovascular hypertension, Pt. on medications Normal cardiovascular exam     Neuro/Psych RLS    GI/Hepatic Neg liver ROS,GERD  Medicated,,  Endo/Other  Hypothyroidism    Renal/GU negative Renal ROS  negative genitourinary   Musculoskeletal  (+) Arthritis ,    Abdominal   Peds  Hematology negative hematology ROS (+)   Anesthesia Other Findings Day of surgery medications reviewed with patient.  Reproductive/Obstetrics Endometrial polyp                             Anesthesia Physical Anesthesia Plan  ASA: 2  Anesthesia Plan: General   Post-op Pain Management: Tylenol PO (pre-op)*   Induction: Intravenous  PONV Risk Score and Plan: 3 and Treatment may vary due to age or medical condition, Ondansetron, Dexamethasone and Midazolam  Airway Management Planned: LMA  Additional Equipment: None  Intra-op Plan:   Post-operative Plan: Extubation in OR  Informed Consent: I have reviewed the patients History and Physical, chart, labs and discussed the procedure including the risks, benefits and alternatives for the proposed anesthesia with the patient or authorized representative who has indicated his/her understanding and acceptance.     Dental advisory given  Plan Discussed with: CRNA  Anesthesia Plan Comments:        Anesthesia Quick Evaluation

## 2023-07-19 NOTE — Transfer of Care (Signed)
Immediate Anesthesia Transfer of Care Note  Patient: Dawn Alexander  Procedure(s) Performed: DILATATION & CURETTAGE/HYSTEROSCOPY WITH MYOSURE (Vagina )  Patient Location: PACU  Anesthesia Type:General  Level of Consciousness: awake, alert , oriented, and patient cooperative  Airway & Oxygen Therapy: Patient Spontanous Breathing and Patient connected to nasal cannula oxygen  Post-op Assessment: Report given to RN and Post -op Vital signs reviewed and stable  Post vital signs: Reviewed and stable  Last Vitals:  Vitals Value Taken Time  BP 134/63 07/19/23 1330  Temp    Pulse 78 07/19/23 1332  Resp 12 07/19/23 1332  SpO2 99 % 07/19/23 1332  Vitals shown include unfiled device data.  Last Pain:  Vitals:   07/19/23 1125  TempSrc: Oral  PainSc: 0-No pain      Patients Stated Pain Goal: 4 (07/19/23 1125)  Complications: No notable events documented.

## 2023-07-19 NOTE — Anesthesia Procedure Notes (Signed)
Procedure Name: LMA Insertion Date/Time: 07/19/2023 12:47 PM  Performed by: Earmon Phoenix, CRNAPre-anesthesia Checklist: Patient identified, Patient being monitored, Emergency Drugs available, Timeout performed and Suction available Patient Re-evaluated:Patient Re-evaluated prior to induction Oxygen Delivery Method: Circle System Utilized Preoxygenation: Pre-oxygenation with 100% oxygen Induction Type: IV induction Ventilation: Mask ventilation without difficulty LMA: LMA inserted LMA Size: 4.0 Number of attempts: 1 Placement Confirmation: positive ETCO2 and breath sounds checked- equal and bilateral

## 2023-07-19 NOTE — Anesthesia Postprocedure Evaluation (Signed)
Anesthesia Post Note  Patient: Dawn Alexander  Procedure(s) Performed: DILATATION & CURETTAGE/HYSTEROSCOPY WITH MYOSURE (Vagina )     Patient location during evaluation: PACU Anesthesia Type: General Level of consciousness: awake and alert Pain management: pain level controlled Vital Signs Assessment: post-procedure vital signs reviewed and stable Respiratory status: spontaneous breathing, nonlabored ventilation and respiratory function stable Cardiovascular status: blood pressure returned to baseline Postop Assessment: no apparent nausea or vomiting Anesthetic complications: no   No notable events documented.  Last Vitals:  Vitals:   07/19/23 1400 07/19/23 1415  BP: 101/71 121/71  Pulse: 71 72  Resp: 12 18  Temp:    SpO2: 96% 96%    Last Pain:  Vitals:   07/19/23 1400  TempSrc:   PainSc: 0-No pain                 Shanda Howells

## 2023-07-19 NOTE — Op Note (Addendum)
07/19/2023  1:33 PM  PATIENT:  Dawn Alexander  67 y.o. female  PRE-OPERATIVE DIAGNOSIS:  Polyp, Endometrial thickening  POST-OPERATIVE DIAGNOSIS:  Polyp, Endometrial thickening  PROCEDURE:  Procedure(s): DILATATION & CURETTAGE/HYSTEROSCOPY WITH MYOSURE  SURGEON:  Jerene Bears  ASSISTANTS: OR staff.    ANESTHESIA:   general  ESTIMATED BLOOD LOSS: 5 mL  BLOOD ADMINISTERED:none   FLUIDS: 500cc LR  UOP: voided before going back to the OR  SPECIMEN:  endometrial polyp and endometrial samping, Pap smear  DISPOSITION OF SPECIMEN:  PATHOLOGY  FINDINGS: long endometrial polyp that extended to the cervix, atrophic appearing endometrium  DESCRIPTION OF OPERATION: Patient was taken to the operating room.  She is placed in the supine position. SCDs were on her lower extremities and functioning properly. General anesthesia with an LMA was administered without difficulty. Dr. Stephannie Peters, anesthesia, oversaw case.  Legs were then placed in the [facility] stirrups in the low lithotomy position. The legs were lifted to the high lithotomy position and the Betadine prep was used on the inner thighs perineum and vagina x3. Patient was draped in a normal standard fashion. The anterior lip of the cervix was grasped with single-tooth tenaculum.  A paracervical block of 1% lidocaine mixed one-to-one with epinephrine (1:100,000 units).  10 cc was used total. The cervix is dilated up to #21 [facility] dilators. The endometrial cavity sounded to 6 cm.   A 5.5 millimeter diagnostic hysteroscope was obtained. Normal saline was used as a hysteroscopic fluid. The hysteroscope was advanced through the endocervical canal into the endometrial cavity. The tubal ostia were noted bilaterally. Additional findings included a long endometrial polyp was noted.  Using the [facility] reach device, the polyp was resected until flush with the endometrial cavity.  The device was then used to sample the endometrium in all quadrants.  At this  point, the procedure was ended.  The hysteroscope was removed. The fluid deficit was 75 cc. The tenaculum was removed from the anterior lip of the cervix.  Pap smear was obtained before the speculum was removed from the vagina. The prep was cleansed of the patient's skin. The legs are positioned back in the supine position. Sponge, lap, needle, instrument counts were correct x2. Patient was taken to recovery in stable condition.   COUNTS:  YES  PLAN OF CARE: Transfer to PACU

## 2023-07-20 ENCOUNTER — Encounter (HOSPITAL_BASED_OUTPATIENT_CLINIC_OR_DEPARTMENT_OTHER): Payer: Self-pay | Admitting: Obstetrics & Gynecology

## 2023-07-20 LAB — SURGICAL PATHOLOGY

## 2023-07-21 LAB — CYTOLOGY - PAP
Diagnosis: NEGATIVE
Diagnosis: REACTIVE

## 2023-08-01 DIAGNOSIS — Z79899 Other long term (current) drug therapy: Secondary | ICD-10-CM | POA: Diagnosis not present

## 2023-08-01 DIAGNOSIS — R7301 Impaired fasting glucose: Secondary | ICD-10-CM | POA: Diagnosis not present

## 2023-08-01 DIAGNOSIS — E039 Hypothyroidism, unspecified: Secondary | ICD-10-CM | POA: Diagnosis not present

## 2023-08-01 DIAGNOSIS — Z1389 Encounter for screening for other disorder: Secondary | ICD-10-CM | POA: Diagnosis not present

## 2023-08-01 DIAGNOSIS — E785 Hyperlipidemia, unspecified: Secondary | ICD-10-CM | POA: Diagnosis not present

## 2023-08-01 DIAGNOSIS — I10 Essential (primary) hypertension: Secondary | ICD-10-CM | POA: Diagnosis not present

## 2023-08-01 DIAGNOSIS — E559 Vitamin D deficiency, unspecified: Secondary | ICD-10-CM | POA: Diagnosis not present

## 2023-08-15 DIAGNOSIS — H5203 Hypermetropia, bilateral: Secondary | ICD-10-CM | POA: Diagnosis not present

## 2023-08-15 DIAGNOSIS — Z0000 Encounter for general adult medical examination without abnormal findings: Secondary | ICD-10-CM | POA: Diagnosis not present

## 2023-08-15 DIAGNOSIS — E039 Hypothyroidism, unspecified: Secondary | ICD-10-CM | POA: Diagnosis not present

## 2023-08-15 DIAGNOSIS — I10 Essential (primary) hypertension: Secondary | ICD-10-CM | POA: Diagnosis not present

## 2023-08-15 DIAGNOSIS — E559 Vitamin D deficiency, unspecified: Secondary | ICD-10-CM | POA: Diagnosis not present

## 2023-08-15 DIAGNOSIS — R82998 Other abnormal findings in urine: Secondary | ICD-10-CM | POA: Diagnosis not present

## 2023-08-15 DIAGNOSIS — Z23 Encounter for immunization: Secondary | ICD-10-CM | POA: Diagnosis not present

## 2023-08-15 DIAGNOSIS — R1011 Right upper quadrant pain: Secondary | ICD-10-CM | POA: Diagnosis not present

## 2023-08-15 DIAGNOSIS — E1169 Type 2 diabetes mellitus with other specified complication: Secondary | ICD-10-CM | POA: Diagnosis not present

## 2023-08-15 DIAGNOSIS — E785 Hyperlipidemia, unspecified: Secondary | ICD-10-CM | POA: Diagnosis not present

## 2023-08-15 DIAGNOSIS — Z1331 Encounter for screening for depression: Secondary | ICD-10-CM | POA: Diagnosis not present

## 2023-08-23 ENCOUNTER — Encounter (HOSPITAL_BASED_OUTPATIENT_CLINIC_OR_DEPARTMENT_OTHER): Payer: Self-pay | Admitting: Obstetrics & Gynecology

## 2023-09-15 DIAGNOSIS — E039 Hypothyroidism, unspecified: Secondary | ICD-10-CM | POA: Diagnosis not present

## 2023-09-15 DIAGNOSIS — E1169 Type 2 diabetes mellitus with other specified complication: Secondary | ICD-10-CM | POA: Diagnosis not present

## 2023-09-15 DIAGNOSIS — I10 Essential (primary) hypertension: Secondary | ICD-10-CM | POA: Diagnosis not present

## 2023-10-04 DIAGNOSIS — Z1231 Encounter for screening mammogram for malignant neoplasm of breast: Secondary | ICD-10-CM | POA: Diagnosis not present

## 2023-10-13 DIAGNOSIS — E039 Hypothyroidism, unspecified: Secondary | ICD-10-CM | POA: Diagnosis not present

## 2023-12-11 DIAGNOSIS — I10 Essential (primary) hypertension: Secondary | ICD-10-CM | POA: Diagnosis not present

## 2023-12-11 DIAGNOSIS — E039 Hypothyroidism, unspecified: Secondary | ICD-10-CM | POA: Diagnosis not present

## 2023-12-11 DIAGNOSIS — E1169 Type 2 diabetes mellitus with other specified complication: Secondary | ICD-10-CM | POA: Diagnosis not present

## 2023-12-11 DIAGNOSIS — E538 Deficiency of other specified B group vitamins: Secondary | ICD-10-CM | POA: Diagnosis not present

## 2023-12-11 DIAGNOSIS — R5383 Other fatigue: Secondary | ICD-10-CM | POA: Diagnosis not present

## 2024-02-13 DIAGNOSIS — R5383 Other fatigue: Secondary | ICD-10-CM | POA: Diagnosis not present

## 2024-02-13 DIAGNOSIS — E559 Vitamin D deficiency, unspecified: Secondary | ICD-10-CM | POA: Diagnosis not present

## 2024-02-13 DIAGNOSIS — Z6829 Body mass index (BMI) 29.0-29.9, adult: Secondary | ICD-10-CM | POA: Diagnosis not present

## 2024-02-13 DIAGNOSIS — E785 Hyperlipidemia, unspecified: Secondary | ICD-10-CM | POA: Diagnosis not present

## 2024-02-13 DIAGNOSIS — I10 Essential (primary) hypertension: Secondary | ICD-10-CM | POA: Diagnosis not present

## 2024-02-13 DIAGNOSIS — E1169 Type 2 diabetes mellitus with other specified complication: Secondary | ICD-10-CM | POA: Diagnosis not present

## 2024-02-13 DIAGNOSIS — E039 Hypothyroidism, unspecified: Secondary | ICD-10-CM | POA: Diagnosis not present

## 2024-02-13 DIAGNOSIS — R110 Nausea: Secondary | ICD-10-CM | POA: Diagnosis not present

## 2024-03-04 ENCOUNTER — Encounter: Payer: Self-pay | Admitting: Gastroenterology

## 2024-03-05 DIAGNOSIS — M7989 Other specified soft tissue disorders: Secondary | ICD-10-CM | POA: Diagnosis not present

## 2024-03-05 DIAGNOSIS — R1011 Right upper quadrant pain: Secondary | ICD-10-CM | POA: Diagnosis not present

## 2024-03-05 DIAGNOSIS — E1169 Type 2 diabetes mellitus with other specified complication: Secondary | ICD-10-CM | POA: Diagnosis not present

## 2024-03-05 DIAGNOSIS — R1012 Left upper quadrant pain: Secondary | ICD-10-CM | POA: Diagnosis not present

## 2024-03-05 DIAGNOSIS — R110 Nausea: Secondary | ICD-10-CM | POA: Diagnosis not present

## 2024-03-14 ENCOUNTER — Encounter: Payer: Self-pay | Admitting: Internal Medicine

## 2024-03-14 ENCOUNTER — Other Ambulatory Visit: Payer: Self-pay | Admitting: Internal Medicine

## 2024-03-14 DIAGNOSIS — E785 Hyperlipidemia, unspecified: Secondary | ICD-10-CM | POA: Diagnosis not present

## 2024-03-14 DIAGNOSIS — E1169 Type 2 diabetes mellitus with other specified complication: Secondary | ICD-10-CM | POA: Diagnosis not present

## 2024-03-14 DIAGNOSIS — E559 Vitamin D deficiency, unspecified: Secondary | ICD-10-CM | POA: Diagnosis not present

## 2024-03-14 DIAGNOSIS — E039 Hypothyroidism, unspecified: Secondary | ICD-10-CM | POA: Diagnosis not present

## 2024-03-14 DIAGNOSIS — R1011 Right upper quadrant pain: Secondary | ICD-10-CM

## 2024-03-14 DIAGNOSIS — R109 Unspecified abdominal pain: Secondary | ICD-10-CM | POA: Diagnosis not present

## 2024-03-14 DIAGNOSIS — Q453 Other congenital malformations of pancreas and pancreatic duct: Secondary | ICD-10-CM

## 2024-03-14 DIAGNOSIS — I10 Essential (primary) hypertension: Secondary | ICD-10-CM | POA: Diagnosis not present

## 2024-03-15 ENCOUNTER — Ambulatory Visit
Admission: RE | Admit: 2024-03-15 | Discharge: 2024-03-15 | Disposition: A | Discharge status: Home / Self Care | Source: Ambulatory Visit | Attending: Internal Medicine

## 2024-03-15 DIAGNOSIS — R1011 Right upper quadrant pain: Secondary | ICD-10-CM

## 2024-03-15 MED ORDER — IOPAMIDOL (ISOVUE-300) INJECTION 61%
100.0000 mL | Freq: Once | INTRAVENOUS | Status: AC | PRN
  Administered 2024-03-15: 100 mL via INTRAVENOUS

## 2024-03-19 ENCOUNTER — Encounter: Payer: Self-pay | Admitting: *Deleted

## 2024-03-19 DIAGNOSIS — R1012 Left upper quadrant pain: Secondary | ICD-10-CM | POA: Diagnosis not present

## 2024-03-19 DIAGNOSIS — I10 Essential (primary) hypertension: Secondary | ICD-10-CM | POA: Diagnosis not present

## 2024-03-19 DIAGNOSIS — E039 Hypothyroidism, unspecified: Secondary | ICD-10-CM | POA: Diagnosis not present

## 2024-03-19 DIAGNOSIS — R110 Nausea: Secondary | ICD-10-CM | POA: Diagnosis not present

## 2024-03-19 DIAGNOSIS — M7989 Other specified soft tissue disorders: Secondary | ICD-10-CM | POA: Diagnosis not present

## 2024-03-19 DIAGNOSIS — E1169 Type 2 diabetes mellitus with other specified complication: Secondary | ICD-10-CM | POA: Diagnosis not present

## 2024-03-19 DIAGNOSIS — G479 Sleep disorder, unspecified: Secondary | ICD-10-CM | POA: Insufficient documentation

## 2024-03-19 DIAGNOSIS — E876 Hypokalemia: Secondary | ICD-10-CM | POA: Insufficient documentation

## 2024-03-19 DIAGNOSIS — M25569 Pain in unspecified knee: Secondary | ICD-10-CM | POA: Insufficient documentation

## 2024-03-19 DIAGNOSIS — R1011 Right upper quadrant pain: Secondary | ICD-10-CM | POA: Diagnosis not present

## 2024-04-03 ENCOUNTER — Encounter

## 2024-04-03 ENCOUNTER — Telehealth: Payer: Self-pay | Admitting: *Deleted

## 2024-04-03 NOTE — Telephone Encounter (Signed)
 Pt will be called on PV date and time

## 2024-04-03 NOTE — Telephone Encounter (Signed)
 Attempt to reach pt for pre-visit. LM with call back #.  Will attempt to reach again in 5 min due to no other # listed in profile  Second attempt to reach pt for pre-vist unsuccessful. LM with facility # for pt to call back. Instructed pt to call # given by end of the day and reschedule the pre-visit  with RN or the scheduled procedure will be canceled.

## 2024-04-03 NOTE — Telephone Encounter (Signed)
 Patient has been rescheduled for next available pv 6/3. Advised patient if call does not come through to give us  a call. Please advise

## 2024-04-09 ENCOUNTER — Telehealth: Payer: Self-pay

## 2024-04-09 ENCOUNTER — Ambulatory Visit (AMBULATORY_SURGERY_CENTER)

## 2024-04-09 ENCOUNTER — Encounter: Payer: Self-pay | Admitting: Gastroenterology

## 2024-04-09 VITALS — Ht 63.0 in | Wt 164.0 lb

## 2024-04-09 DIAGNOSIS — Z1211 Encounter for screening for malignant neoplasm of colon: Secondary | ICD-10-CM

## 2024-04-09 MED ORDER — NA SULFATE-K SULFATE-MG SULF 17.5-3.13-1.6 GM/177ML PO SOLN
1.0000 | Freq: Once | ORAL | 0 refills | Status: AC
Start: 2024-04-09 — End: 2024-04-09

## 2024-04-09 NOTE — Patient Instructions (Signed)
 Diabetic patients - Diabetic patients - ORAL DIABETIC MEDICATION INSTRUCTIONS (Actos, Alogliptin, Farxiga, Metformin, Glipizide, Glimepiride, Invokana,  Jardiance, Janumet, Januvia, Rybelsus, Xigduo, Sitagliptin, Synjardy & Tradjenta, PO Semaglutide)    The day before your procedure: 6/10 Take your diabetic pill as you do normally   The day of your procedure: 6/11      Do not take your diabetic pill       We will check your blood sugar levels during the admission process and again in Recovery before discharging you home    Stop ONCE A WEEK INJECTIONS: Trulicity, Ozempic,  Mounjaro, Wegovy, Tanzeum, Byetta, Victoza, Bydureon, Symlin Pen & Zepbound - DO NOT TAKE 7 days prior to the procedure.  Last dose 04/09/24

## 2024-04-09 NOTE — Telephone Encounter (Signed)
 PV in progress

## 2024-04-09 NOTE — Progress Notes (Signed)

## 2024-04-17 ENCOUNTER — Ambulatory Visit (AMBULATORY_SURGERY_CENTER): Admitting: Gastroenterology

## 2024-04-17 ENCOUNTER — Encounter: Payer: Self-pay | Admitting: Gastroenterology

## 2024-04-17 VITALS — BP 113/65 | HR 62 | Temp 97.2°F | Resp 17 | Ht 63.0 in | Wt 164.0 lb

## 2024-04-17 DIAGNOSIS — K5730 Diverticulosis of large intestine without perforation or abscess without bleeding: Secondary | ICD-10-CM | POA: Diagnosis not present

## 2024-04-17 DIAGNOSIS — D123 Benign neoplasm of transverse colon: Secondary | ICD-10-CM

## 2024-04-17 DIAGNOSIS — Z1211 Encounter for screening for malignant neoplasm of colon: Secondary | ICD-10-CM | POA: Diagnosis not present

## 2024-04-17 DIAGNOSIS — K640 First degree hemorrhoids: Secondary | ICD-10-CM

## 2024-04-17 DIAGNOSIS — I10 Essential (primary) hypertension: Secondary | ICD-10-CM | POA: Diagnosis not present

## 2024-04-17 DIAGNOSIS — G2581 Restless legs syndrome: Secondary | ICD-10-CM | POA: Diagnosis not present

## 2024-04-17 DIAGNOSIS — E785 Hyperlipidemia, unspecified: Secondary | ICD-10-CM | POA: Diagnosis not present

## 2024-04-17 MED ORDER — SODIUM CHLORIDE 0.9 % IV SOLN: 500.0000 mL | INTRAVENOUS | Status: DC

## 2024-04-17 NOTE — Op Note (Signed)
 [facility] Patient Name: Dawn Alexander Procedure Date: 04/17/2024 11:30 AM MRN: [id number] Endoscopist: Geralyn Knee E. Cherryl Corona , MD, [id number] Age: 68 Referring MD:  Date of Birth: [birth year] Gender: Female Account #: [id number] Procedure:                Colonoscopy Indications:              Screening for colorectal malignant neoplasm (last                            colonoscopy was more than 10 years ago) Medicines:                Monitored Anesthesia Care Procedure:                Pre-Anesthesia Assessment:                           - Prior to the procedure, a History and Physical                            was performed, and patient medications and                            allergies were reviewed. The patient's tolerance of                            previous anesthesia was also reviewed. The risks                            and benefits of the procedure and the sedation                            options and risks were discussed with the patient.                            All questions were answered, and informed consent                            was obtained. Prior Anticoagulants: The patient has                            taken no anticoagulant or antiplatelet agents. ASA                            Grade Assessment: II - A patient with mild systemic                            disease. After reviewing the risks and benefits,                            the patient was deemed in satisfactory condition to                            undergo the procedure.  After obtaining informed consent, the colonoscope                            was passed under direct vision. Throughout the                            procedure, the patient's blood pressure, pulse, and                            oxygen saturations were monitored continuously. The                            Olympus Scope SN: [id number] was introduced through                            the anus  and advanced to the the terminal ileum,                            with identification of the appendiceal orifice and                            IC valve. The colonoscopy was performed without                            difficulty. The patient tolerated the procedure                            well. The quality of the bowel preparation was                            adequate. The terminal ileum, ileocecal valve,                            appendiceal orifice, and rectum were photographed.                            The bowel preparation used was SUPREP via split                            dose instruction. Scope In: 11:41:02 AM Scope Out: 12:02:36 PM Scope Withdrawal Time: 0 hours 14 minutes 4 seconds  Total Procedure Duration: 0 hours 21 minutes 34 seconds  Findings:                 The perianal and digital rectal examinations were                            normal. Pertinent negatives include normal                            sphincter tone and no palpable rectal lesions.                           Two sessile polyps were found in the transverse  colon. The polyps were 4 to 8 mm in size. These                            polyps were removed with a cold snare. Resection                            and retrieval were complete. Estimated blood loss                            was minimal.                           Many large-mouthed and small-mouthed diverticula                            were found in the sigmoid colon and descending                            colon. There was narrowing of the colon in                            association with the diverticular opening.                           The exam was otherwise normal throughout the                            examined colon.                           The terminal ileum appeared normal.                           Non-bleeding internal hemorrhoids were found during                            retroflexion. The  hemorrhoids were Grade I                            (internal hemorrhoids that do not prolapse).                           No additional abnormalities were found on                            retroflexion. Complications:            No immediate complications. Estimated Blood Loss:     Estimated blood loss was minimal. Impression:               - Two 4 to 8 mm polyps in the transverse colon,                            removed with a cold snare. Resected and retrieved.                           -  Moderate diverticulosis in the sigmoid colon and                            in the descending colon. There was narrowing of the                            colon in association with the diverticular opening.                           - The examined portion of the ileum was normal.                           - Non-bleeding internal hemorrhoids. Recommendation:           - Patient has a contact number available for                            emergencies. The signs and symptoms of potential                            delayed complications were discussed with the                            patient. Return to normal activities tomorrow.                            Written discharge instructions were provided to the                            patient.                           - Resume previous diet.                           - Repeat colonoscopy (date not yet determined) for                            surveillance based on pathology results.                           - Recommend high fiber diet or daily fiber                            supplement to reduce risk of diverticular                            complications. Vena Bassinger E. Cherryl Corona, MD 04/17/2024 12:07:29 PM This report has been signed electronically.

## 2024-04-17 NOTE — Progress Notes (Signed)
 [facility] History and Physical   Primary Care Physician:  Windell Hasty, DO   Reason for Procedure:   Colon cancer screening  Plan:    Screening colonoscopy     HPI: Dawn Alexander is a 68 y.o. female undergoing average risk screening colonoscopy.  She has no family history of colon cancer and no chronic GI symptoms.  She had a colonoscopy 12 or 13 years ago in [place]  which was normal, per patient.   Past Medical History:  Diagnosis Date   Abnormal endometrial ultrasound    Chronic lower back pain    DDD (degenerative disc disease), lumbar    Endometrial polyp    Hyperlipidemia    Hypertension    Hypothyroidism    followed by pcp   Insomnia    Lichen sclerosus of vulva    OA (osteoarthritis)    Pre-diabetes    Restless leg syndrome    Uterine fibroid    Vitamin D  deficiency    Wears contact lenses    Wears dentures    upper only    Past Surgical History:  Procedure Laterality Date   DILATATION & CURETTAGE/HYSTEROSCOPY WITH MYOSURE N/A 07/19/2023   Procedure: DILATATION & CURETTAGE/HYSTEROSCOPY WITH MYOSURE;  Surgeon: Lillian Rein, MD;  Location: [facility];  Service: Gynecology;  Laterality: N/A;   TENNIS ELBOW RELEASE/NIRSCHEL PROCEDURE Right 1988    Prior to Admission medications   Medication Sig Start Date End Date Taking? Authorizing Provider  Acetaminophen -Caffeine (EXCEDRIN ASPIRIN FREE PO) Take by mouth daily as needed.   Yes [provider]  Ascorbic Acid (VITAMIN C) 500 MG CAPS Take 1 capsule by mouth daily.   Yes [provider]  atorvastatin  (LIPITOR) 20 MG tablet Take 20 mg by mouth at bedtime.   Yes [provider]  B Complex Vitamins (B COMPLEX-B12 PO) Take 1 capsule by mouth daily.   Yes [provider]  Cyanocobalamin  (B-12) 5000 MCG CAPS Take 1 capsule by mouth daily.   Yes [provider]  diphenhydrAMINE HCl, Sleep, (SLEEP-AID) 25 MG CAPS Take 2 capsules by mouth at  bedtime as needed.   Yes [provider]  levothyroxine  (SYNTHROID ) 100 MCG tablet TAKE 1 TABLET EVERY DAY BEFORE BREAKFAST Patient taking differently: Take 100 mcg by mouth daily before breakfast. 09/20/21  Yes Abonza, Maritza, PA-C  Magnesium 400 MG CAPS Take 1 capsule by mouth daily.   Yes [provider]  metFORMIN (GLUCOPHAGE) 500 MG tablet Take 500 mg by mouth 2 (two) times daily with a meal.   Yes [provider]  omeprazole (PRILOSEC) 20 MG capsule Take 20 mg by mouth 2 (two) times daily. 11/30/22  Yes [provider]  potassium chloride (KLOR-CON M) 10 MEQ tablet Take 20 mEq by mouth 2 (two) times daily.   Yes [provider]  Vitamin D , Cholecalciferol, 25 MCG (1000 UT) TABS 1 tablet Orally Once a day   Yes [provider]  betamethasone  valerate ointment (VALISONE ) 0.1 % Apply a pea sized amount topically 1-2 times weekly and BID for up to two weeks for a flare up. Patient taking differently: as needed. Apply a pea sized amount topically 1-2 times weekly and BID for up to two weeks for a flare up. 01/10/23   Jertson, Jill Evelyn, MD  hydrochlorothiazide  (HYDRODIURIL ) 25 MG tablet Take 1 tablet (25 mg total) by mouth daily. Patient not taking: Reported on 04/17/2024 12/27/21   Abonza, Maritza, PA-C  HYDROcodone -acetaminophen  (NORCO/VICODIN) 5-325 MG tablet Take  1-2 tablets by mouth every 6 (six) hours as needed for moderate pain. Patient not taking: Reported on 04/17/2024 07/19/23   Lillian Rein, MD  ibuprofen  (ADVIL ) 600 MG tablet Take 1 tablet (600 mg total) by mouth every 6 (six) hours as needed for fever, headache or mild pain. 07/19/23   Lillian Rein, MD  OZEMPIC, 1 MG/DOSE, 4 MG/3ML SOPN inject 1mg  Subcutaneous weekly    [provider]  triamcinolone  cream (KENALOG ) 0.1 % Apply 1 Application topically as needed.    [provider]    Current Outpatient Medications  Medication Sig Dispense Refill    Acetaminophen -Caffeine (EXCEDRIN ASPIRIN FREE PO) Take by mouth daily as needed.     Ascorbic Acid (VITAMIN C) 500 MG CAPS Take 1 capsule by mouth daily.     atorvastatin  (LIPITOR) 20 MG tablet Take 20 mg by mouth at bedtime.     B Complex Vitamins (B COMPLEX-B12 PO) Take 1 capsule by mouth daily.     Cyanocobalamin  (B-12) 5000 MCG CAPS Take 1 capsule by mouth daily.     diphenhydrAMINE HCl, Sleep, (SLEEP-AID) 25 MG CAPS Take 2 capsules by mouth at bedtime as needed.     levothyroxine  (SYNTHROID ) 100 MCG tablet TAKE 1 TABLET EVERY DAY BEFORE BREAKFAST (Patient taking differently: Take 100 mcg by mouth daily before breakfast.) 90 tablet 1   Magnesium 400 MG CAPS Take 1 capsule by mouth daily.     metFORMIN (GLUCOPHAGE) 500 MG tablet Take 500 mg by mouth 2 (two) times daily with a meal.     omeprazole (PRILOSEC) 20 MG capsule Take 20 mg by mouth 2 (two) times daily.     potassium chloride (KLOR-CON M) 10 MEQ tablet Take 20 mEq by mouth 2 (two) times daily.     Vitamin D , Cholecalciferol, 25 MCG (1000 UT) TABS 1 tablet Orally Once a day     betamethasone  valerate ointment (VALISONE ) 0.1 % Apply a pea sized amount topically 1-2 times weekly and BID for up to two weeks for a flare up. (Patient taking differently: as needed. Apply a pea sized amount topically 1-2 times weekly and BID for up to two weeks for a flare up.) 30 g 0   hydrochlorothiazide  (HYDRODIURIL ) 25 MG tablet Take 1 tablet (25 mg total) by mouth daily. (Patient not taking: Reported on 04/17/2024) 90 tablet 0   HYDROcodone -acetaminophen  (NORCO/VICODIN) 5-325 MG tablet Take 1-2 tablets by mouth every 6 (six) hours as needed for moderate pain. (Patient not taking: Reported on 04/17/2024) 12 tablet 0   ibuprofen  (ADVIL ) 600 MG tablet Take 1 tablet (600 mg total) by mouth every 6 (six) hours as needed for fever, headache or mild pain. 20 tablet 0   OZEMPIC, 1 MG/DOSE, 4 MG/3ML SOPN inject 1mg  Subcutaneous weekly     triamcinolone  cream (KENALOG )  0.1 % Apply 1 Application topically as needed.     Current Facility-Administered Medications  Medication Dose Route Frequency Provider Last Rate Last Admin   0.9 %  sodium chloride  infusion  500 mL Intravenous Continuous Elois Hair, MD        Allergies as of 04/17/2024 - Review Complete 04/17/2024  Allergen Reaction Noted   Clindamycin/lincomycin Rash 04/28/2020    Family History  Problem Relation Age of Onset   Diabetes Mother    COPD Mother    Cancer Mother        lung   Multiple sclerosis Father    Heart disease Father    Cancer Sister  lung   Stroke Sister    Stroke Sister    Leukemia Sister    Cancer Brother        bladder   Alcohol abuse Brother    Healthy Brother    Leukemia Brother 5   Healthy Daughter    Colon cancer Neg Hx    Rectal cancer Neg Hx    Stomach cancer Neg Hx     Social History   Socioeconomic History   Marital status: Married    Spouse name: Not on file   Number of children: Not on file   Years of education: Not on file   Highest education level: Not on file  Occupational History   Not on file  Tobacco Use   Smoking status: Never   Smokeless tobacco: Never  Vaping Use   Vaping status: Never Used  Substance and Sexual Activity   Alcohol use: Not Currently    Comment: occasional   Drug use: Never   Sexual activity: Yes    Partners: Male    Birth control/protection: Post-menopausal  Other Topics Concern   Not on file  Social History Narrative   Not on file   Social Drivers of Health   Financial Resource Strain: Not on file  Food Insecurity: Not on file  Transportation Needs: Not on file  Physical Activity: Not on file  Stress: Not on file  Social Connections: Not on file  Intimate Partner Violence: Not on file    Review of Systems:  All other review of systems negative except as mentioned in the HPI.  Physical Exam: Vital signs BP 123/86   Pulse 83   Temp (!) 97.2 F (36.2 C)   Ht 5' 3 (1.6 m)    Wt 164 lb (74.4 kg)   SpO2 95%   BMI 29.05 kg/m   General:   Alert,  Well-developed, well-nourished, pleasant and cooperative in NAD Airway:  Mallampati 2 Lungs:  Clear throughout to auscultation.   Heart:  Regular rate and rhythm; no murmurs, clicks, rubs,  or gallops. Abdomen:  Soft, nontender and nondistended. Normal bowel sounds.   Neuro/Psych:  Normal mood and affect. A and O x 3   Tamon Parkerson E. Cherryl Corona, MD [facility]

## 2024-04-17 NOTE — Patient Instructions (Addendum)
Handouts Provided:  Polyps, High Fiber Diet and Diverticulosis  YOU HAD AN ENDOSCOPIC PROCEDURE TODAY AT THE [facility]:   Refer to the procedure report that was given to you for any specific questions about what was found during the examination.  If the procedure report does not answer your questions, please call your gastroenterologist to clarify.  If you requested that your care partner not be given the details of your procedure findings, then the procedure report has been included in a sealed envelope for you to review at your convenience later.  YOU SHOULD EXPECT: Some feelings of bloating in the abdomen. Passage of more gas than usual.  Walking can help get rid of the air that was put into your GI tract during the procedure and reduce the bloating. If you had a lower endoscopy (such as a colonoscopy or flexible sigmoidoscopy) you may notice spotting of blood in your stool or on the toilet paper. If you underwent a bowel prep for your procedure, you may not have a normal bowel movement for a few days.  Please Note:  You might notice some irritation and congestion in your nose or some drainage.  This is from the oxygen used during your procedure.  There is no need for concern and it should clear up in a day or so.  SYMPTOMS TO REPORT IMMEDIATELY:  Following lower endoscopy (colonoscopy or flexible sigmoidoscopy):  Excessive amounts of blood in the stool  Significant tenderness or worsening of abdominal pains  Swelling of the abdomen that is new, acute  Fever of 100F or higher  For urgent or emergent issues, a gastroenterologist can be reached at any hour by calling [phone]. Do not use MyChart messaging for urgent concerns.    DIET:  We do recommend a small meal at first, but then you may proceed to your regular diet.  Drink plenty of fluids but you should avoid alcoholic beverages for 24 hours.  ACTIVITY:  You should plan to take it easy for the rest of today and you  should NOT DRIVE or use heavy machinery until tomorrow (because of the sedation medicines used during the test).    FOLLOW UP: Our staff will call the number listed on your records the next business day following your procedure.  We will call around 7:15- 8:00 am to check on you and address any questions or concerns that you may have regarding the information given to you following your procedure. If we do not reach you, we will leave a message.     If any biopsies were taken you will be contacted by phone or by letter within the next 1-3 weeks.  Please call us at [phone] if you have not heard about the biopsies in 3 weeks.    SIGNATURES/CONFIDENTIALITY: You and/or your care partner have signed paperwork which will be entered into your electronic medical record.  These signatures attest to the fact that that the information above on your After Visit Summary has been reviewed and is understood.  Full responsibility of the confidentiality of this discharge information lies with you and/or your care-partner.

## 2024-04-17 NOTE — Progress Notes (Signed)
 Sedate, gd SR, tolerated procedure well, VSS, report to RN

## 2024-04-17 NOTE — Progress Notes (Signed)
 Pt's states no medical or surgical changes since previsit or office visit.

## 2024-04-17 NOTE — Progress Notes (Signed)
 Called to room to assist during endoscopic procedure.  Patient ID and intended procedure confirmed with present staff. Received instructions for my participation in the procedure from the performing physician.

## 2024-04-18 ENCOUNTER — Telehealth: Payer: Self-pay | Admitting: *Deleted

## 2024-04-18 NOTE — Telephone Encounter (Signed)
  Follow up Call-     04/17/2024   10:44 AM  Call back number  Post procedure Call Back phone  # [phone]  Permission to leave phone message Yes     Patient questions:  Message left to call us  if necessary.

## 2024-04-23 ENCOUNTER — Ambulatory Visit: Payer: Self-pay | Admitting: Gastroenterology

## 2024-04-23 NOTE — Progress Notes (Signed)
 Ms. Fagin,  The two polyps which I removed during your recent procedure were proven to be completely benign but are considered pre-cancerous polyps that MAY have grown into cancer if they had not been removed.  Studies shows that at least 20% of women over age 70 and 30% of men over age 89 have pre-cancerous polyps.  Based on current nationally recognized surveillance guidelines, it would be recommended that you have a repeat colonoscopy in 7 years.   However, because colon cancer screening after age 24 is done on a case-by-case basis, taking into account the patient's risk factors for colon cancer, as well as comorbidities and life expectancy, I would recommend you make an appointment with me in 7 years to discuss the risks/benefits of further colon cancer screening.

## 2024-08-13 DIAGNOSIS — E785 Hyperlipidemia, unspecified: Secondary | ICD-10-CM | POA: Diagnosis not present

## 2024-08-13 DIAGNOSIS — I10 Essential (primary) hypertension: Secondary | ICD-10-CM | POA: Diagnosis not present

## 2024-08-13 DIAGNOSIS — E7849 Other hyperlipidemia: Secondary | ICD-10-CM | POA: Diagnosis not present

## 2024-08-13 DIAGNOSIS — E559 Vitamin D deficiency, unspecified: Secondary | ICD-10-CM | POA: Diagnosis not present

## 2024-08-13 DIAGNOSIS — E1169 Type 2 diabetes mellitus with other specified complication: Secondary | ICD-10-CM | POA: Diagnosis not present

## 2024-08-13 DIAGNOSIS — E039 Hypothyroidism, unspecified: Secondary | ICD-10-CM | POA: Diagnosis not present

## 2024-08-20 DIAGNOSIS — E039 Hypothyroidism, unspecified: Secondary | ICD-10-CM | POA: Diagnosis not present

## 2024-08-20 DIAGNOSIS — G479 Sleep disorder, unspecified: Secondary | ICD-10-CM | POA: Diagnosis not present

## 2024-08-20 DIAGNOSIS — M7989 Other specified soft tissue disorders: Secondary | ICD-10-CM | POA: Diagnosis not present

## 2024-08-20 DIAGNOSIS — E1169 Type 2 diabetes mellitus with other specified complication: Secondary | ICD-10-CM | POA: Diagnosis not present

## 2024-08-20 DIAGNOSIS — R110 Nausea: Secondary | ICD-10-CM | POA: Diagnosis not present

## 2024-08-20 DIAGNOSIS — R1012 Left upper quadrant pain: Secondary | ICD-10-CM | POA: Diagnosis not present

## 2024-08-20 DIAGNOSIS — E559 Vitamin D deficiency, unspecified: Secondary | ICD-10-CM | POA: Diagnosis not present

## 2024-08-20 DIAGNOSIS — I10 Essential (primary) hypertension: Secondary | ICD-10-CM | POA: Diagnosis not present

## 2024-08-20 DIAGNOSIS — Z23 Encounter for immunization: Secondary | ICD-10-CM | POA: Diagnosis not present

## 2024-10-09 DIAGNOSIS — Z1231 Encounter for screening mammogram for malignant neoplasm of breast: Secondary | ICD-10-CM | POA: Diagnosis not present

## 2024-12-25 ENCOUNTER — Ambulatory Visit (HOSPITAL_BASED_OUTPATIENT_CLINIC_OR_DEPARTMENT_OTHER): Admitting: Obstetrics & Gynecology
